# Patient Record
Sex: Female | Born: 1975
Health system: Southern US, Community
[De-identification: ages and names within clinical notes are randomized; demographics above are authoritative.]

## PROBLEM LIST (undated history)

## (undated) DIAGNOSIS — T8859XA Other complications of anesthesia, initial encounter: Secondary | ICD-10-CM

## (undated) DIAGNOSIS — T4145XA Adverse effect of unspecified anesthetic, initial encounter: Secondary | ICD-10-CM

## (undated) DIAGNOSIS — R112 Nausea with vomiting, unspecified: Secondary | ICD-10-CM

## (undated) DIAGNOSIS — J45909 Unspecified asthma, uncomplicated: Secondary | ICD-10-CM

## (undated) DIAGNOSIS — K219 Gastro-esophageal reflux disease without esophagitis: Secondary | ICD-10-CM

## (undated) DIAGNOSIS — R51 Headache: Secondary | ICD-10-CM

## (undated) DIAGNOSIS — Z9889 Other specified postprocedural states: Secondary | ICD-10-CM

## (undated) DIAGNOSIS — D649 Anemia, unspecified: Secondary | ICD-10-CM

## (undated) DIAGNOSIS — N3281 Overactive bladder: Secondary | ICD-10-CM

## (undated) DIAGNOSIS — Z973 Presence of spectacles and contact lenses: Secondary | ICD-10-CM

## (undated) DIAGNOSIS — M797 Fibromyalgia: Secondary | ICD-10-CM

## (undated) DIAGNOSIS — M1990 Unspecified osteoarthritis, unspecified site: Secondary | ICD-10-CM

## (undated) DIAGNOSIS — D259 Leiomyoma of uterus, unspecified: Secondary | ICD-10-CM

## (undated) DIAGNOSIS — R519 Headache, unspecified: Secondary | ICD-10-CM

## (undated) HISTORY — PX: HERNIA REPAIR: SHX51

## (undated) HISTORY — DX: Overactive bladder: N32.81

## (undated) HISTORY — PX: WISDOM TOOTH EXTRACTION: SHX21

## (undated) HISTORY — DX: Leiomyoma of uterus, unspecified: D25.9

## (undated) HISTORY — PX: BREAST EXCISIONAL BIOPSY: SUR124

## (undated) HISTORY — PX: BREAST SURGERY: SHX581

---

## 2005-05-20 ENCOUNTER — Encounter: Admission: RE | Admit: 2005-05-20 | Discharge: 2005-05-20 | Payer: Self-pay | Admitting: Obstetrics and Gynecology

## 2005-05-20 ENCOUNTER — Encounter (INDEPENDENT_AMBULATORY_CARE_PROVIDER_SITE_OTHER): Payer: Self-pay | Admitting: Specialist

## 2005-06-04 ENCOUNTER — Ambulatory Visit (HOSPITAL_COMMUNITY): Admission: RE | Admit: 2005-06-04 | Discharge: 2005-06-04 | Payer: Self-pay | Admitting: General Surgery

## 2005-06-04 ENCOUNTER — Encounter (INDEPENDENT_AMBULATORY_CARE_PROVIDER_SITE_OTHER): Payer: Self-pay | Admitting: Specialist

## 2009-05-24 ENCOUNTER — Other Ambulatory Visit: Admission: RE | Admit: 2009-05-24 | Discharge: 2009-05-24 | Payer: Self-pay | Admitting: Family Medicine

## 2009-12-16 ENCOUNTER — Emergency Department (HOSPITAL_BASED_OUTPATIENT_CLINIC_OR_DEPARTMENT_OTHER): Admission: EM | Admit: 2009-12-16 | Discharge: 2009-12-16 | Payer: Self-pay | Admitting: Emergency Medicine

## 2009-12-16 ENCOUNTER — Ambulatory Visit: Payer: Self-pay | Admitting: Diagnostic Radiology

## 2010-08-19 ENCOUNTER — Other Ambulatory Visit: Payer: Self-pay | Admitting: Family Medicine

## 2010-08-19 ENCOUNTER — Other Ambulatory Visit
Admission: RE | Admit: 2010-08-19 | Discharge: 2010-08-19 | Payer: Self-pay | Source: Home / Self Care | Admitting: Family Medicine

## 2010-10-19 ENCOUNTER — Inpatient Hospital Stay (HOSPITAL_COMMUNITY)
Admission: AD | Admit: 2010-10-19 | Discharge: 2010-10-19 | Disposition: A | Discharge status: Home / Self Care | Payer: 59 | Source: Ambulatory Visit | Attending: Obstetrics | Admitting: Obstetrics

## 2010-10-19 DIAGNOSIS — N6459 Other signs and symptoms in breast: Secondary | ICD-10-CM | POA: Insufficient documentation

## 2010-10-19 LAB — POCT PREGNANCY, URINE
Preg Test, Ur: NEGATIVE
Preg Test, Ur: POSITIVE

## 2010-10-20 LAB — PROLACTIN: Prolactin: 16 ng/mL

## 2010-10-21 ENCOUNTER — Other Ambulatory Visit: Payer: Self-pay | Admitting: Obstetrics

## 2010-10-21 DIAGNOSIS — N6452 Nipple discharge: Secondary | ICD-10-CM

## 2010-10-24 ENCOUNTER — Ambulatory Visit
Admission: RE | Admit: 2010-10-24 | Discharge: 2010-10-24 | Disposition: A | Discharge status: Home / Self Care | Payer: 59 | Source: Ambulatory Visit | Attending: Obstetrics | Admitting: Obstetrics

## 2010-10-24 ENCOUNTER — Other Ambulatory Visit: Payer: Self-pay | Admitting: Obstetrics

## 2010-10-24 DIAGNOSIS — N6452 Nipple discharge: Secondary | ICD-10-CM

## 2010-11-07 ENCOUNTER — Ambulatory Visit
Admission: RE | Admit: 2010-11-07 | Discharge: 2010-11-07 | Disposition: A | Discharge status: Home / Self Care | Payer: 59 | Source: Ambulatory Visit | Attending: Obstetrics | Admitting: Obstetrics

## 2010-11-07 DIAGNOSIS — N6452 Nipple discharge: Secondary | ICD-10-CM

## 2010-11-27 ENCOUNTER — Encounter (HOSPITAL_COMMUNITY): Payer: 59 | Attending: Surgery

## 2010-11-27 ENCOUNTER — Other Ambulatory Visit: Payer: Self-pay | Admitting: Anesthesiology

## 2010-11-27 ENCOUNTER — Other Ambulatory Visit: Payer: Self-pay | Admitting: Surgery

## 2010-11-27 DIAGNOSIS — Z01812 Encounter for preprocedural laboratory examination: Secondary | ICD-10-CM | POA: Insufficient documentation

## 2010-11-27 DIAGNOSIS — N6459 Other signs and symptoms in breast: Secondary | ICD-10-CM | POA: Insufficient documentation

## 2010-11-27 DIAGNOSIS — N6049 Mammary duct ectasia of unspecified breast: Secondary | ICD-10-CM | POA: Insufficient documentation

## 2010-11-27 LAB — SURGICAL PCR SCREEN
MRSA, PCR: NEGATIVE
Staphylococcus aureus: NEGATIVE

## 2010-11-27 LAB — HCG, SERUM, QUALITATIVE: Preg, Serum: NEGATIVE

## 2010-12-05 ENCOUNTER — Ambulatory Visit (HOSPITAL_COMMUNITY)
Admission: RE | Admit: 2010-12-05 | Discharge: 2010-12-05 | Disposition: A | Discharge status: Home / Self Care | Payer: 59 | Source: Ambulatory Visit | Attending: Surgery | Admitting: Surgery

## 2010-12-05 ENCOUNTER — Other Ambulatory Visit: Payer: Self-pay | Admitting: Surgery

## 2010-12-05 DIAGNOSIS — N6459 Other signs and symptoms in breast: Secondary | ICD-10-CM | POA: Insufficient documentation

## 2010-12-05 DIAGNOSIS — D249 Benign neoplasm of unspecified breast: Secondary | ICD-10-CM | POA: Insufficient documentation

## 2010-12-10 NOTE — Op Note (Signed)
NAMELADEJAH, SULA             ACCOUNT NO.:  [id number]  MEDICAL RECORD NO.:  [id number]           PATIENT TYPE:  O  LOCATION:  DAYL                         FACILITY:  [facility]  PHYSICIAN:  Wilmon Arms. Corliss Skains, M.D. DATE OF BIRTH:  1975/09/01  DATE OF PROCEDURE:  12/05/2010 DATE OF DISCHARGE:  12/05/2010                              OPERATIVE REPORT   PREOPERATIVE DIAGNOSIS:  Left bloody nipple discharge.  POSTOPERATIVE DIAGNOSIS:  Left bloody nipple discharge.  PROCEDURE:  Left nipple duct excision.  SURGEON:  Wilmon Arms. Tannis Burstein, M.D.  ANESTHESIA:  General  INDICATIONS:  This is a 35 year old female who presents with a recent onset of bloody nipple discharge from the left breast.  Mammogram was unremarkable.  Ultrasound showed a dilated subareolar duct at 6 o'clock containing some amorphous echogenic material.  A ductogram showed a blind ending subareolar region concerning for papilloma.  She presents now for surgical evaluation.  DESCRIPTION OF PROCEDURE:  The patient was brought to the operating room and placed in supine position on the operating room table.  After an adequate level of general anesthesia was obtained, left breast was prepped with ChloraPrep and draped in sterile fashion.  We cannulated the most prominent opening in the lower nipple.  We were unable to express any bloody discharge but this was the only ductal opening that seems enlarged.  This corresponds with the bloody nipple discharge that I saw in the office few weeks ago.  We inserted a lacrimal duct probe. I then infiltrated the area around the nipple with 0.25% Marcaine.  We made a curvilinear incision around the areola inferiorly.  Dissection was carried superiorly under the nipple until we reached the previously placed lacrimal duct probe.  We identified this duct and grasped this with a hemostat.  We removed the probe.  I then took a cone-shaped cylinder of tissue measuring about 2 cm across for  depth of about 3.5 cm.  The specimen was oriented with the paint kit.  I could palpate some fullness within the specimen.  Specimen was sent for pathologic examination.  We irrigated thoroughly for hemostasis.  I could not palpate any further masses in this area.  We closed the wound with a deep layer of 3-0 Vicryl and a subcuticular 4-0 Monocryl.  Steri-Strips and clean dressings were applied.  The patient was extubated, brought to recovery in stable condition.  All sponge, instrument, needle counts were correct.     Wilmon Arms. Corliss Skains, M.D.    MKT/MEDQ  D:  12/05/2010  T:  12/05/2010  Job:  [id number]  Electronically Signed by Manus Rudd M.D. on 12/10/2010 07:57:40 AM

## 2010-12-12 ENCOUNTER — Ambulatory Visit (HOSPITAL_COMMUNITY)
Admission: EM | Admit: 2010-12-12 | Discharge: 2010-12-12 | Disposition: A | Discharge status: Home / Self Care | Payer: 59 | Source: Ambulatory Visit | Attending: Surgery | Admitting: Surgery

## 2010-12-12 DIAGNOSIS — Y838 Other surgical procedures as the cause of abnormal reaction of the patient, or of later complication, without mention of misadventure at the time of the procedure: Secondary | ICD-10-CM | POA: Insufficient documentation

## 2010-12-12 DIAGNOSIS — IMO0002 Reserved for concepts with insufficient information to code with codable children: Secondary | ICD-10-CM | POA: Insufficient documentation

## 2010-12-12 NOTE — Op Note (Signed)
Sandra [name], [name]             ACCOUNT NO.:  [id number]   MEDICAL RECORD NO.:  [id number]          PATIENT TYPE:  AMB   LOCATION:  DAY                          FACILITY:  [facility]   PHYSICIAN:  Ollen Gross. Vernell Morgans, M.D. DATE OF BIRTH:  September 08, 1975   DATE OF PROCEDURE:  06/04/2005  DATE OF DISCHARGE:                                 OPERATIVE REPORT   PREOPERATIVE DIAGNOSES:  Left breast sclerosing papilloma.   POSTOPERATIVE DIAGNOSES:  Left breast sclerosing papilloma.   PROCEDURE:  Left breast lumpectomy.   SURGEON:  Ollen Gross. Carolynne Edouard, M.D.   ANESTHESIA:  General via LMA.   DESCRIPTION OF PROCEDURE:  After informed consent was obtained, the patient  was brought to the operating room, placed in the supine position on the  operating table. After adequate induction of general anesthesia, the  patient's left breast was prepped with Betadine, draped in the usual sterile  manner. A small transverse incision was made overlying the palpable mass.  This mass was in the upper outer quadrant. This incision was carried down  through the skin and subcutaneous tissue sharply with the electrocautery  into the fatty breast tissue. Once this was accomplished, the mass was  really palpable. The mass was removed from the rest of the fatty breast  tissue by sharp dissection with the electrocautery. Once the mass was  removed, the mass was oriented with the short stitch being superior and long  stitch being lateral and sent to pathology for further evaluation.  Hemostasis was achieved using the Bovie electrocautery. The wound was then  closed with interrupted 4-0 Monocryl subcuticular stitches, Benzoin and  Steri-Strips and sterile dressings were applied. The patient tolerated the  procedure well. At the end of the case, all needle, sponge and instrument  counts were correct. Prior to closing the wound, it was also infiltrated  with 0.25% Marcaine. The patient was then awakened and taken to the recovery  room in stable condition.      Ollen Gross. Vernell Morgans, M.D.  Electronically Signed     PST/MEDQ  D:  06/04/2005  T:  06/04/2005  Job:  [id number]

## 2010-12-15 NOTE — Op Note (Signed)
Sandra [name], Sandra [name]             ACCOUNT NO.:  [id number]  MEDICAL RECORD NO.:  [id number]           PATIENT TYPE:  O  LOCATION:  SDSC                         FACILITY:  [facility]  PHYSICIAN:  Wilmon Arms. Corliss Skains, M.D. DATE OF BIRTH:  September 02, 1975  DATE OF PROCEDURE:  12/12/2010 DATE OF DISCHARGE:  12/12/2010                              OPERATIVE REPORT   PREOPERATIVE DIAGNOSIS:  Left breast postoperative hematoma.  POSTOPERATIVE DIAGNOSIS:  Left breast postoperative hematoma.  PROCEDURE:  Evacuation of left breast hematoma.  SURGEON:  Wilmon Arms. Corliss Skains, MD  ANESTHESIA:  General.  INDICATIONS:  This is a 35 year old female who is 1 week postop from a left nipple duct excision.  Her pathology was benign showing only intraductal papilloma.  However, the patient has developed significant bruising and swelling around her surgical site.  She was seen in the office today and was felt to have a large postoperative hematoma.  She presents now for emergent evacuation.  DESCRIPTION OF PROCEDURE:  The patient was brought to the operating room and placed in supine position on the operating room table.  After an adequate level of general anesthesia was obtained, the patient's left breast was prepped with ChloraPrep and draped in sterile fashion.  Time- out was taken to assure the proper patient and proper procedure.  We opened her previous incision and entered the biopsy cavity.  A large amount of clot was evacuated.  We irrigated thoroughly to remove the remainder of the clot.  We then carefully inspected the entire biopsy cavity in its entirety.  We cauterized anything that look like it might be oozing.  There was a significant area of bleeding deep within the wound laterally.  This was thoroughly cauterized and inspected for hemostasis.  Once we had carefully inspected for any significant bleeding, we took a piece of Surgicel Fibrillar and placed several small pieces around the cavity.  The  wound was then closed with 3-0 Vicryl and a subcuticular 4-0 Monocryl.  Steri-Strips and clean dressing were applied.  The patient was then extubated and brought to the recovery room in stable condition.  All sponge, instrument, and needle counts were correct.     Wilmon Arms. Corliss Skains, M.D.     MKT/MEDQ  D:  12/12/2010  T:  12/13/2010  Job:  [id number]  Electronically Signed by Manus Rudd M.D. on 12/15/2010 10:07:17 AM

## 2011-02-18 ENCOUNTER — Encounter (INDEPENDENT_AMBULATORY_CARE_PROVIDER_SITE_OTHER): Payer: Self-pay | Admitting: Surgery

## 2011-02-19 ENCOUNTER — Encounter (INDEPENDENT_AMBULATORY_CARE_PROVIDER_SITE_OTHER): Payer: Self-pay | Admitting: Surgery

## 2011-10-18 ENCOUNTER — Encounter (HOSPITAL_COMMUNITY): Payer: Self-pay | Admitting: General Practice

## 2011-10-18 ENCOUNTER — Emergency Department (HOSPITAL_COMMUNITY)
Admission: EM | Admit: 2011-10-18 | Discharge: 2011-10-18 | Disposition: A | Discharge status: Home / Self Care | Payer: 59 | Attending: Emergency Medicine | Admitting: Emergency Medicine

## 2011-10-18 DIAGNOSIS — Z880 Allergy status to penicillin: Secondary | ICD-10-CM | POA: Insufficient documentation

## 2011-10-18 DIAGNOSIS — K0889 Other specified disorders of teeth and supporting structures: Secondary | ICD-10-CM

## 2011-10-18 DIAGNOSIS — K089 Disorder of teeth and supporting structures, unspecified: Secondary | ICD-10-CM | POA: Insufficient documentation

## 2011-10-18 MED ORDER — FENTANYL CITRATE 0.05 MG/ML IJ SOLN
50.0000 ug | Freq: Once | INTRAMUSCULAR | Status: AC
  Administered 2011-10-18: 50 ug via INTRAMUSCULAR

## 2011-10-18 MED ORDER — OXYCODONE-ACETAMINOPHEN 5-325 MG PO TABS: 1.0000 | ORAL_TABLET | Freq: Four times a day (QID) | ORAL | Status: AC | PRN

## 2011-10-18 MED ORDER — CLINDAMYCIN HCL 300 MG PO CAPS: 300.0000 mg | ORAL_CAPSULE | Freq: Three times a day (TID) | ORAL | Status: AC

## 2011-10-18 MED ORDER — FENTANYL CITRATE 0.05 MG/ML IJ SOLN
INTRAMUSCULAR | Status: AC
  Administered 2011-10-18: 50 ug via INTRAMUSCULAR
  Filled 2011-10-18: qty 2

## 2011-10-18 NOTE — ED Notes (Signed)
Pt to ED c/o tooth pain to L lower tooth pain in wisdom tooth area.

## 2011-10-18 NOTE — ED Provider Notes (Signed)
History     CSN: [id number]  Arrival date & time 10/18/11  0219   First MD Initiated Contact with Patient 10/18/11 [phone]      Chief Complaint  Patient presents with  . Dental Pain    (Consider location/radiation/quality/duration/timing/severity/associated sxs/prior treatment) Patient is a 36 y.o. female presenting with tooth pain. The history is provided by the patient. No language interpreter was used.  Dental PainPrimary symptoms do not include dental injury, oral bleeding, oral lesions, sore throat or angioedema. The symptoms began 3 to 5 days ago. The symptoms are worsening. The symptoms are new. The symptoms occur constantly.  Additional symptoms include: dental sensitivity to temperature. Additional symptoms do not include: gum swelling, purulent gums, trismus, facial swelling, trouble swallowing, pain with swallowing and dry mouth. Medical issues do not include: smoking.    History reviewed. No pertinent past medical history.  Past Surgical History  Procedure Date  . Cesarean section   . Breast surgery 12/12/2010    lt breast hematoma    No family history on file.  History  Substance Use Topics  . Smoking status: Never Smoker   . Smokeless tobacco: Never Used  . Alcohol Use: 1.2 oz/week    2 Cans of beer per week    OB History    Grav Para Term Preterm Abortions TAB SAB Ect Mult Living                  Review of Systems  Constitutional: Negative.   HENT: Negative for sore throat, facial swelling and trouble swallowing.   Eyes: Negative for visual disturbance.  Respiratory: Negative.   Cardiovascular: Negative.   Gastrointestinal: Negative.   All other systems reviewed and are negative.    Allergies  Penicillins  Home Medications   Current Outpatient Rx  Name Route Sig Dispense Refill  . MOTRIN PM PO Oral Take 4 tablets by mouth every 4 (four) hours as needed. For pain.    Marland Kitchen CLINDAMYCIN HCL 300 MG PO CAPS Oral Take 1 capsule (300 mg total) by mouth  3 (three) times daily. X 7 days 21 capsule 0  . OXYCODONE-ACETAMINOPHEN 5-325 MG PO TABS Oral Take 1 tablet by mouth every 6 (six) hours as needed for pain. 13 tablet 0    BP 118/79  Pulse 79  Temp(Src) 98.3 F (36.8 C) (Oral)  Resp 16  SpO2 100%  Physical Exam  Constitutional: She is oriented to person, place, and time. She appears well-developed and well-nourished. No distress.  HENT:  Head: Normocephalic and atraumatic.  Mouth/Throat: Oropharynx is clear and moist.    Eyes: Conjunctivae are normal. Pupils are equal, round, and reactive to light.  Neck: Normal range of motion. Neck supple.  Cardiovascular: Normal rate and regular rhythm.   Pulmonary/Chest: Effort normal and breath sounds normal.  Abdominal: Soft. Bowel sounds are normal.  Musculoskeletal: Normal range of motion.  Neurological: She is alert and oriented to person, place, and time.  Skin: Skin is warm and dry.  Psychiatric: She has a normal mood and affect.    ED Course  Procedures (including critical care time)  Labs Reviewed - No data to display No results found.   1. Pain, dental       MDM  Take all antibiotics follow up with oral surgery return for facial swelling      Aveyah Greenwood K Kenzleigh Sedam-Rasch, MD 10/18/11 [phone]

## 2011-10-18 NOTE — Discharge Instructions (Signed)
Dental Pain  A tooth ache may be caused by cavities (tooth decay). Cavities expose the nerve of the tooth to air and hot or cold temperatures. It may come from an infection or abscess (also called a boil or furuncle) around your tooth. It is also often caused by dental caries (tooth decay). This causes the pain you are having.  DIAGNOSIS   Your caregiver can diagnose this problem by exam.  TREATMENT   · If caused by an infection, it may be treated with medications which kill germs (antibiotics) and pain medications as prescribed by your caregiver. Take medications as directed.  · Only take over-the-counter or prescription medicines for pain, discomfort, or fever as directed by your caregiver.  · Whether the tooth ache today is caused by infection or dental disease, you should see your dentist as soon as possible for further care.  SEEK MEDICAL CARE IF:  The exam and treatment you received today has been provided on an emergency basis only. This is not a substitute for complete medical or dental care. If your problem worsens or new problems (symptoms) appear, and you are unable to meet with your dentist, call or return to this location.  SEEK IMMEDIATE MEDICAL CARE IF:   · You have a fever.  · You develop redness and swelling of your face, jaw, or neck.  · You are unable to open your mouth.  · You have severe pain uncontrolled by pain medicine.  MAKE SURE YOU:   · Understand these instructions.  · Will watch your condition.  · Will get help right away if you are not doing well or get worse.  Document Released: 07/13/2005 Document Revised: 07/02/2011 Document Reviewed: 02/29/2008  Exit[employer]® Patient Information ©2012 [employer].

## 2011-12-30 ENCOUNTER — Other Ambulatory Visit: Payer: Self-pay | Admitting: Family Medicine

## 2011-12-30 DIAGNOSIS — R928 Other abnormal and inconclusive findings on diagnostic imaging of breast: Secondary | ICD-10-CM

## 2012-01-06 ENCOUNTER — Ambulatory Visit
Admission: RE | Admit: 2012-01-06 | Discharge: 2012-01-06 | Disposition: A | Discharge status: Home / Self Care | Payer: 59 | Source: Ambulatory Visit | Attending: Family Medicine | Admitting: Family Medicine

## 2012-01-06 DIAGNOSIS — R928 Other abnormal and inconclusive findings on diagnostic imaging of breast: Secondary | ICD-10-CM

## 2012-01-08 ENCOUNTER — Ambulatory Visit
Admission: RE | Admit: 2012-01-08 | Discharge: 2012-01-08 | Disposition: A | Discharge status: Home / Self Care | Payer: 59 | Source: Ambulatory Visit | Attending: Family Medicine | Admitting: Family Medicine

## 2012-01-08 ENCOUNTER — Other Ambulatory Visit: Payer: Self-pay | Admitting: Family Medicine

## 2012-01-08 DIAGNOSIS — R928 Other abnormal and inconclusive findings on diagnostic imaging of breast: Secondary | ICD-10-CM

## 2012-03-18 ENCOUNTER — Other Ambulatory Visit: Payer: Self-pay | Admitting: Family Medicine

## 2012-03-18 ENCOUNTER — Ambulatory Visit: Admission: RE | Admit: 2012-03-18 | Payer: 59 | Source: Ambulatory Visit

## 2012-03-18 ENCOUNTER — Ambulatory Visit
Admission: RE | Admit: 2012-03-18 | Discharge: 2012-03-18 | Disposition: A | Discharge status: Home / Self Care | Payer: 59 | Source: Ambulatory Visit

## 2012-03-18 DIAGNOSIS — R928 Other abnormal and inconclusive findings on diagnostic imaging of breast: Secondary | ICD-10-CM

## 2012-10-12 ENCOUNTER — Other Ambulatory Visit: Payer: Self-pay | Admitting: Family Medicine

## 2012-10-12 DIAGNOSIS — R921 Mammographic calcification found on diagnostic imaging of breast: Secondary | ICD-10-CM

## 2012-10-21 ENCOUNTER — Ambulatory Visit
Admission: RE | Admit: 2012-10-21 | Discharge: 2012-10-21 | Disposition: A | Discharge status: Home / Self Care | Payer: 59 | Source: Ambulatory Visit | Attending: Family Medicine | Admitting: Family Medicine

## 2012-10-21 DIAGNOSIS — R921 Mammographic calcification found on diagnostic imaging of breast: Secondary | ICD-10-CM

## 2014-02-12 ENCOUNTER — Other Ambulatory Visit: Payer: Self-pay | Admitting: Family Medicine

## 2014-02-12 DIAGNOSIS — R921 Mammographic calcification found on diagnostic imaging of breast: Secondary | ICD-10-CM

## 2014-02-20 ENCOUNTER — Ambulatory Visit
Admission: RE | Admit: 2014-02-20 | Discharge: 2014-02-20 | Disposition: A | Discharge status: Home / Self Care | Payer: 59 | Source: Ambulatory Visit | Attending: Family Medicine | Admitting: Family Medicine

## 2014-02-20 ENCOUNTER — Encounter (INDEPENDENT_AMBULATORY_CARE_PROVIDER_SITE_OTHER): Payer: Self-pay

## 2014-02-20 DIAGNOSIS — R921 Mammographic calcification found on diagnostic imaging of breast: Secondary | ICD-10-CM

## 2015-01-03 ENCOUNTER — Inpatient Hospital Stay (HOSPITAL_COMMUNITY): Payer: 59

## 2015-01-03 ENCOUNTER — Encounter (HOSPITAL_COMMUNITY): Payer: Self-pay | Admitting: *Deleted

## 2015-01-03 ENCOUNTER — Inpatient Hospital Stay (HOSPITAL_COMMUNITY)
Admission: AD | Admit: 2015-01-03 | Discharge: 2015-01-03 | Disposition: A | Discharge status: Home / Self Care | Payer: 59 | Source: Ambulatory Visit | Attending: Obstetrics & Gynecology | Admitting: Obstetrics & Gynecology

## 2015-01-03 DIAGNOSIS — B373 Candidiasis of vulva and vagina: Secondary | ICD-10-CM | POA: Diagnosis not present

## 2015-01-03 DIAGNOSIS — R102 Pelvic and perineal pain: Secondary | ICD-10-CM | POA: Diagnosis present

## 2015-01-03 DIAGNOSIS — N939 Abnormal uterine and vaginal bleeding, unspecified: Secondary | ICD-10-CM | POA: Insufficient documentation

## 2015-01-03 DIAGNOSIS — Z880 Allergy status to penicillin: Secondary | ICD-10-CM | POA: Insufficient documentation

## 2015-01-03 DIAGNOSIS — N7011 Chronic salpingitis: Secondary | ICD-10-CM

## 2015-01-03 DIAGNOSIS — B3731 Acute candidiasis of vulva and vagina: Secondary | ICD-10-CM

## 2015-01-03 LAB — URINALYSIS, ROUTINE W REFLEX MICROSCOPIC
Bilirubin Urine: NEGATIVE
Glucose, UA: NEGATIVE mg/dL
Ketones, ur: NEGATIVE mg/dL
Leukocytes, UA: NEGATIVE
Nitrite: NEGATIVE
Protein, ur: NEGATIVE mg/dL
Specific Gravity, Urine: 1.015 (ref 1.005–1.030)
Urobilinogen, UA: 0.2 mg/dL (ref 0.0–1.0)
pH: 6 (ref 5.0–8.0)

## 2015-01-03 LAB — CBC WITH DIFFERENTIAL/PLATELET
Basophils Absolute: 0 10*3/uL (ref 0.0–0.1)
Basophils Relative: 0 % (ref 0–1)
Eosinophils Absolute: 0.1 10*3/uL (ref 0.0–0.7)
Eosinophils Relative: 3 % (ref 0–5)
HCT: 31.4 % — ABNORMAL LOW (ref 36.0–46.0)
Hemoglobin: 10.4 g/dL — ABNORMAL LOW (ref 12.0–15.0)
Lymphocytes Relative: 32 % (ref 12–46)
Lymphs Abs: 1.5 10*3/uL (ref 0.7–4.0)
MCH: 28.8 pg (ref 26.0–34.0)
MCHC: 33.1 g/dL (ref 30.0–36.0)
MCV: 87 fL (ref 78.0–100.0)
Monocytes Absolute: 0.4 10*3/uL (ref 0.1–1.0)
Monocytes Relative: 9 % (ref 3–12)
Neutro Abs: 2.5 10*3/uL (ref 1.7–7.7)
Neutrophils Relative %: 56 % (ref 43–77)
Platelets: 222 10*3/uL (ref 150–400)
RBC: 3.61 MIL/uL — ABNORMAL LOW (ref 3.87–5.11)
RDW: 14 % (ref 11.5–15.5)
WBC: 4.6 10*3/uL (ref 4.0–10.5)

## 2015-01-03 LAB — URINE MICROSCOPIC-ADD ON

## 2015-01-03 LAB — POCT PREGNANCY, URINE: Preg Test, Ur: NEGATIVE

## 2015-01-03 LAB — WET PREP, GENITAL: Trich, Wet Prep: NONE SEEN

## 2015-01-03 MED ORDER — ONDANSETRON 8 MG PO TBDP
8.0000 mg | ORAL_TABLET | Freq: Once | ORAL | Status: AC
  Administered 2015-01-03: 8 mg via ORAL
  Filled 2015-01-03: qty 1

## 2015-01-03 MED ORDER — HYDROMORPHONE HCL 1 MG/ML IJ SOLN
1.0000 mg | Freq: Once | INTRAMUSCULAR | Status: AC
  Administered 2015-01-03: 1 mg via INTRAMUSCULAR
  Filled 2015-01-03: qty 1

## 2015-01-03 MED ORDER — FLUCONAZOLE 150 MG PO TABS
150.0000 mg | ORAL_TABLET | Freq: Once | ORAL | Status: AC
  Administered 2015-01-03: 150 mg via ORAL
  Filled 2015-01-03: qty 1

## 2015-01-03 MED ORDER — NAPROXEN 250 MG PO TABS: 250.0000 mg | ORAL_TABLET | Freq: Two times a day (BID) | ORAL | Status: DC

## 2015-01-03 MED ORDER — TRAMADOL HCL 50 MG PO TABS: 50.0000 mg | ORAL_TABLET | Freq: Four times a day (QID) | ORAL | Status: DC | PRN

## 2015-01-03 MED ORDER — NORGESTIMATE-ETH ESTRADIOL 0.25-35 MG-MCG PO TABS: 1.0000 | ORAL_TABLET | Freq: Every day | ORAL | Status: DC

## 2015-01-03 NOTE — Discharge Instructions (Signed)
Candidal Vulvovaginitis Candidal vulvovaginitis is an infection of the vagina and vulva. The vulva is the skin around the opening of the vagina. This may cause itching and discomfort in and around the vagina.  HOME CARE  Only take medicine as told by your doctor.  Do not have sex (intercourse) until the infection is healed or as told by your doctor.  Practice safe sex.  Tell your sex partner about your infection.  Do not douche or use tampons.  Wear cotton underwear. Do not wear tight pants or panty hose.  Eat yogurt. This may help treat and prevent yeast infections. GET HELP RIGHT AWAY IF:   You have a fever.  Your problems get worse during treatment or do not get better in 3 days.  You have discomfort, irritation, or itching in your vagina or vulva area.  You have pain after sex.  You start to get belly (abdominal) pain. MAKE SURE YOU:  Understand these instructions.  Will watch your condition.  Will get help right away if you are not doing well or get worse. Document Released: 10/09/2008 Document Revised: 07/18/2013 Document Reviewed: 10/09/2008 [facility] Patient Information 2015 [place], [place]. This information is not intended to replace advice given to you by your health care provider. Make sure you discuss any questions you have with your health care provider. Pelvic Pain Pelvic pain is pain felt below the belly button and between your hips. It can be caused by many different things. It is important to get help right away. This is especially true for severe, sharp, or unusual pain that comes on suddenly.  HOME CARE  Only take medicine as told by your doctor.  Rest as told by your doctor.  Eat a healthy diet, such as fruits, vegetables, and lean meats.  Drink enough fluids to keep your pee (urine) clear or pale yellow, or as told.  Avoid sex (intercourse) if it causes pain.  Apply warm or cold packs to your lower belly (abdomen). Use the type of pack that helps  the pain.  Avoid situations that cause you stress.  Keep a journal to track your pain. Write down:  When the pain started.  Where it is located.  If there are things that seem to be related to the pain, such as food or your period.  Follow up with your doctor as told. GET HELP RIGHT AWAY IF:   You have heavy bleeding from the vagina.  You have more pelvic pain.  You feel lightheaded or pass out (faint).  You have chills.  You have pain when you pee or have blood in your pee.  You cannot stop having watery poop (diarrhea).  You cannot stop throwing up (vomiting).  You have a fever or lasting symptoms for more than 3 days.  You have a fever and your symptoms suddenly get worse.  You are being physically or sexually abused.  Your medicine does not help your pain.  You have fluid (discharge) coming from your vagina that is not normal. MAKE SURE YOU:  Understand these instructions.  Will watch your condition.  Will get help if you are not doing well or get worse. Document Released: 12/30/2007 Document Revised: 01/12/2012 Document Reviewed: 11/02/2011 [facility] Patient Information 2015 [place], [place]. This information is not intended to replace advice given to you by your health care provider. Make sure you discuss any questions you have with your health care provider.

## 2015-01-03 NOTE — MAU Provider Note (Signed)
History     CSN: [id number]  Arrival date and time: 01/03/15 1145   First Provider Initiated Contact with Patient 01/03/15 1318      Chief Complaint  Patient presents with  . Pelvic Pain   HPI  [name] is a 39 y.o. G2P2000 who presents to MAU today with complaint of pelvic pain and vaginal bleeding. The patient states that she had bleeding most of the month of May. Bleeding stopped for a few days and then she resumed bleeding on 12/31/14. She states that bleeding is heavy. She states that she is bleeding through a super plus tampon q 1 hour. She denies history of irregular periods. She states periods are usually 7 days. She had BTL for birth control and denies other recent birth control use. She is sexually active. She states bilateral pelvic pain x 2 days since onset of bleeding. She states pain sometimes radiates into the LUQ. She denies back or flank pain. She states pain is 10/10 now. She has been taking ibuprofen for pain up to 800 mg at a time x 2 days with minimal relief. She denies fever or UTI symptoms. She states mild nausea without vomiting, diarrhea or constipation, but does admit to one loose stool earlier today.   OB History    Gravida Para Term Preterm AB TAB SAB Ectopic Multiple Living   2 2 2              History reviewed. No pertinent past medical history.  Past Surgical History  Procedure Laterality Date  . Cesarean section    . Breast surgery  12/12/2010    lt breast hematoma    History reviewed. No pertinent family history.  History  Substance Use Topics  . Smoking status: Never Smoker   . Smokeless tobacco: Never Used  . Alcohol Use: 1.2 oz/week    2 Cans of beer per week    Allergies:  Allergies  Allergen Reactions  . Penicillins Rash    No prescriptions prior to admission    Review of Systems  Constitutional: Negative for fever and malaise/fatigue.  Gastrointestinal: Positive for nausea and abdominal pain. Negative for vomiting,  diarrhea and constipation.  Genitourinary: Negative for dysuria, urgency, frequency and flank pain.       + vaginal bleeding   Physical Exam   Blood pressure 105/68, pulse 73, temperature 97.4 F (36.3 C), temperature source Oral, resp. rate 18, weight 177 lb 7 oz (80.485 kg), last menstrual period 12/31/2014, SpO2 100 %.  Physical Exam  Nursing note and vitals reviewed. Constitutional: She is oriented to person, place, and time. She appears well-developed and well-nourished. No distress.  HENT:  Head: Normocephalic and atraumatic.  Cardiovascular: Normal rate.   Respiratory: Effort normal.  GI: Soft. She exhibits no distension and no mass. There is tenderness (mild tenderness to palpation of the suprapubic region bilaterally). There is no rebound and no guarding.  Genitourinary: Uterus is not enlarged and not tender. Cervix exhibits no motion tenderness, no discharge and no friability. Right adnexum displays no mass and no tenderness. Left adnexum displays no mass and no tenderness. No bleeding in the vagina. No vaginal discharge found.  Neurological: She is alert and oriented to person, place, and time.  Skin: Skin is warm and dry. No erythema.  Psychiatric: She has a normal mood and affect.   Results for orders placed or performed during the hospital encounter of 01/03/15 (from the past 24 hour(s))  Urinalysis, Routine w reflex microscopic (not  at [facility])     Status: Abnormal   Collection Time: 01/03/15 12:03 PM  Result Value Ref Range   Color, Urine YELLOW YELLOW   APPearance CLEAR CLEAR   Specific Gravity, Urine 1.015 1.005 - 1.030   pH 6.0 5.0 - 8.0   Glucose, UA NEGATIVE NEGATIVE mg/dL   Hgb urine dipstick TRACE (A) NEGATIVE   Bilirubin Urine NEGATIVE NEGATIVE   Ketones, ur NEGATIVE NEGATIVE mg/dL   Protein, ur NEGATIVE NEGATIVE mg/dL   Urobilinogen, UA 0.2 0.0 - 1.0 mg/dL   Nitrite NEGATIVE NEGATIVE   Leukocytes, UA NEGATIVE NEGATIVE  Urine microscopic-add on     Status:  None   Collection Time: 01/03/15 12:03 PM  Result Value Ref Range   Squamous Epithelial / LPF RARE RARE   RBC / HPF 0-2 <3 RBC/hpf  Pregnancy, urine POC     Status: None   Collection Time: 01/03/15 12:23 PM  Result Value Ref Range   Preg Test, Ur NEGATIVE NEGATIVE  CBC with Differential/Platelet     Status: Abnormal   Collection Time: 01/03/15  1:38 PM  Result Value Ref Range   WBC 4.6 4.0 - 10.5 K/uL   RBC 3.61 (L) 3.87 - 5.11 MIL/uL   Hemoglobin 10.4 (L) 12.0 - 15.0 g/dL   HCT 31.4 (L) 36.0 - 46.0 %   MCV 87.0 78.0 - 100.0 fL   MCH 28.8 26.0 - 34.0 pg   MCHC 33.1 30.0 - 36.0 g/dL   RDW 14.0 11.5 - 15.5 %   Platelets 222 150 - 400 K/uL   Neutrophils Relative % 56 43 - 77 %   Neutro Abs 2.5 1.7 - 7.7 K/uL   Lymphocytes Relative 32 12 - 46 %   Lymphs Abs 1.5 0.7 - 4.0 K/uL   Monocytes Relative 9 3 - 12 %   Monocytes Absolute 0.4 0.1 - 1.0 K/uL   Eosinophils Relative 3 0 - 5 %   Eosinophils Absolute 0.1 0.0 - 0.7 K/uL   Basophils Relative 0 0 - 1 %   Basophils Absolute 0.0 0.0 - 0.1 K/uL  Wet prep, genital     Status: Abnormal   Collection Time: 01/03/15  4:05 PM  Result Value Ref Range   Yeast Wet Prep HPF POC MODERATE (A) NONE SEEN   Trich, Wet Prep NONE SEEN NONE SEEN   Clue Cells Wet Prep HPF POC FEW (A) NONE SEEN   WBC, Wet Prep HPF POC FEW (A) NONE SEEN    MAU Course  Procedures None  MDM UPT - negative UA, wet prep, GC/Chlamydia, CBC, HIV, RPR and [place] today 1 mg Dilaudid given in MAU Patient reports resolution of pain, but is nauseous and vomiting from medication 8 mg ODT Zofran given Patient reports improvement in all symptoms 150 mg Diflucan given in MAU Discussed patient with Dr. Ihor Dow. She recommends Rx for Naproxen and Ultram and OCPs and follow-up with [facility] in 2-3 weeks.   Assessment and Plan  A: Hydrosalpinx Yeast vulvovaginitis Abnormal uterine bleeding  P: Discharge home Rx for Naproxen, Ultram and Sprintec given/sent to patient's  pharmacy Warning signs for worsening condition discussed Referred to [facility] for further evaluation of AUB and follow-up for hydrosalpinx. They will call patient with an appointment.  Patient may return to MAU as needed or if her condition were to change or worsen   Luvenia Redden, PA-C  01/03/2015, 5:43 PM

## 2015-01-03 NOTE — MAU Note (Addendum)
Pt c/o pain in her ovaries since last night.  Pt c/o vag bleeding (had her period)x 2 months. Stated the pain she is having today is different from cramps. Pt reported she has taken 6 ibuprofen this morning without releif

## 2015-01-04 LAB — RPR: RPR Ser Ql: NONREACTIVE

## 2015-01-04 LAB — GC/CHLAMYDIA PROBE AMP (~~LOC~~) NOT AT ARMC
Chlamydia: NEGATIVE
Neisseria Gonorrhea: NEGATIVE

## 2015-01-04 LAB — HIV ANTIBODY (ROUTINE TESTING W REFLEX): HIV Screen 4th Generation wRfx: NONREACTIVE

## 2015-01-31 ENCOUNTER — Encounter: Payer: Self-pay | Admitting: Family Medicine

## 2015-01-31 ENCOUNTER — Ambulatory Visit (INDEPENDENT_AMBULATORY_CARE_PROVIDER_SITE_OTHER): Payer: 59 | Admitting: Family Medicine

## 2015-01-31 VITALS — BP 106/63 | HR 82 | Temp 98.3°F | Ht 62.0 in | Wt 181.4 lb

## 2015-01-31 DIAGNOSIS — Z3202 Encounter for pregnancy test, result negative: Secondary | ICD-10-CM

## 2015-01-31 DIAGNOSIS — N8000 Endometriosis of the uterus, unspecified: Secondary | ICD-10-CM

## 2015-01-31 DIAGNOSIS — N946 Dysmenorrhea, unspecified: Secondary | ICD-10-CM | POA: Diagnosis not present

## 2015-01-31 DIAGNOSIS — Z30433 Encounter for removal and reinsertion of intrauterine contraceptive device: Secondary | ICD-10-CM | POA: Diagnosis not present

## 2015-01-31 DIAGNOSIS — N8003 Adenomyosis of the uterus: Secondary | ICD-10-CM | POA: Insufficient documentation

## 2015-01-31 LAB — POCT PREGNANCY, URINE: Preg Test, Ur: NEGATIVE

## 2015-01-31 MED ORDER — IBUPROFEN 200 MG PO TABS
800.0000 mg | ORAL_TABLET | Freq: Once | ORAL | Status: AC
  Administered 2015-01-31: 800 mg via ORAL

## 2015-01-31 MED ORDER — LEVONORGESTREL 20 MCG/24HR IU IUD
INTRAUTERINE_SYSTEM | Freq: Once | INTRAUTERINE | Status: AC
  Administered 2015-01-31: 1 via INTRAUTERINE

## 2015-01-31 NOTE — Progress Notes (Signed)
Subjective:    Patient ID: Sandra [name], female    DOB: 06/07/1976, 39 y.o.   MRN: [id number]  HPI 38yo [id number], referred to abnormal bleeding that started two months ago.  Bleeding for 2 weeks, then stops for two weeks.  Was seen in [facility]U 1 month ago, was prescribed sprintec, which has helped a little bit.  Still having cramping pain, mostly left sided. Was prescribed naproxen, which is mildly helpful.  The patient had an ultrasound in the MAU which showed a cervical thickness of 4 mm, with heterogeneous echotexture compatible with adenomyosis. There was no fibroids or other masses visualized.   Review of Systems  Constitutional: Negative for fever, chills and fatigue.  Cardiovascular: Negative for chest pain and palpitations.  Gastrointestinal: Negative for nausea, vomiting, abdominal pain, diarrhea, constipation and abdominal distention.  Genitourinary: Positive for pelvic pain. Negative for dysuria, decreased urine volume, vaginal discharge and vaginal pain.  All other systems reviewed and are negative.      Objective:   Physical Exam  Constitutional: She is oriented to person, place, and time. She appears well-developed and well-nourished.  HENT:  Head: Normocephalic and atraumatic.  Cardiovascular: Normal rate, regular rhythm and normal heart sounds.  Exam reveals no gallop and no friction rub.   No murmur heard. Pulmonary/Chest: Effort normal and breath sounds normal. No respiratory distress. She has no wheezes. She has no rales. She exhibits no tenderness.  Abdominal: Soft. Bowel sounds are normal. She exhibits no distension and no mass. There is no tenderness. There is no rebound and no guarding.  Genitourinary: There is no rash, tenderness or lesion on the right labia. There is no rash, tenderness or lesion on the left labia. Cervix exhibits no motion tenderness and no discharge. No erythema, tenderness or bleeding in the vagina. No foreign body around the vagina. No signs of  injury around the vagina. No vaginal discharge found.  Neurological: She is alert and oriented to person, place, and time.  Skin: Skin is warm and dry.  Psychiatric: She has a normal mood and affect. Her behavior is normal. Judgment and thought content normal.      Assessment & Plan:  1. Uterus, adenomyosis 2. Dysmenorrhea Discussed with the patient the [place] results and that adenomyosis likely causing dysfunction.  Endometrial biopsy would like be inconclusive and no helpful.  Discussed using Mirena for control of bleeding and dysmenorrhea.  Risks discussed - pt consented.   - levonorgestrel (MIRENA) 20 MCG/24HR IUD; by Intrauterine route once. - ibuprofen (ADVIL,MOTRIN) tablet 800 mg; Take 4 tablets (800 mg total) by mouth once.   IUD Procedure Note Patient identified, informed consent performed, signed copy in chart, time out was performed.  Urine pregnancy test negative.  Speculum placed in the vagina.  Cervix visualized.  Cleaned with Betadine x 2.  Grasped anteriorly with a single tooth tenaculum.  Internal os mildly stenotic, which was dilated with plastic dilators easily.  Uterus sounded to 8 cm.  Mirena IUD placed per manufacturer's recommendations with mild resistance.  Strings trimmed to 3 cm. Tenaculum was removed, good hemostasis noted.  Patient tolerated procedure well.   Patient given post procedure instructions and Mirena care card with expiration date.  Patient is asked to check IUD strings periodically and follow up in 4-6 weeks for IUD check.

## 2015-01-31 NOTE — Patient Instructions (Signed)

## 2015-02-28 ENCOUNTER — Ambulatory Visit: Payer: 59 | Admitting: Family Medicine

## 2017-03-25 DIAGNOSIS — K5904 Chronic idiopathic constipation: Secondary | ICD-10-CM | POA: Diagnosis not present

## 2017-03-25 DIAGNOSIS — R1013 Epigastric pain: Secondary | ICD-10-CM | POA: Diagnosis not present

## 2017-03-25 DIAGNOSIS — K219 Gastro-esophageal reflux disease without esophagitis: Secondary | ICD-10-CM | POA: Diagnosis not present

## 2017-03-28 ENCOUNTER — Other Ambulatory Visit: Payer: Self-pay | Admitting: Gastroenterology

## 2017-03-28 DIAGNOSIS — R1011 Right upper quadrant pain: Secondary | ICD-10-CM

## 2017-03-28 NOTE — Progress Notes (Signed)
Joseff Luckman MD 

## 2017-04-09 ENCOUNTER — Ambulatory Visit (HOSPITAL_COMMUNITY)
Admission: RE | Admit: 2017-04-09 | Discharge: 2017-04-09 | Disposition: A | Discharge status: Home / Self Care | Payer: 59 | Source: Ambulatory Visit | Attending: Gastroenterology | Admitting: Gastroenterology

## 2017-04-09 ENCOUNTER — Encounter (HOSPITAL_COMMUNITY)
Admission: RE | Admit: 2017-04-09 | Discharge: 2017-04-09 | Disposition: A | Discharge status: Home / Self Care | Payer: 59 | Source: Ambulatory Visit | Attending: Gastroenterology | Admitting: Gastroenterology

## 2017-04-09 ENCOUNTER — Encounter (HOSPITAL_COMMUNITY): Payer: Self-pay | Admitting: Radiology

## 2017-04-09 DIAGNOSIS — R1011 Right upper quadrant pain: Secondary | ICD-10-CM | POA: Insufficient documentation

## 2017-04-09 DIAGNOSIS — D1803 Hemangioma of intra-abdominal structures: Secondary | ICD-10-CM | POA: Diagnosis not present

## 2017-04-09 DIAGNOSIS — D1809 Hemangioma of other sites: Secondary | ICD-10-CM | POA: Diagnosis not present

## 2017-04-09 MED ORDER — TECHNETIUM TC 99M MEBROFENIN IV KIT
5.4300 | PACK | Freq: Once | INTRAVENOUS | Status: AC | PRN
  Administered 2017-04-09: 5.43 via INTRAVENOUS

## 2017-05-14 DIAGNOSIS — K219 Gastro-esophageal reflux disease without esophagitis: Secondary | ICD-10-CM | POA: Diagnosis not present

## 2017-05-14 DIAGNOSIS — K259 Gastric ulcer, unspecified as acute or chronic, without hemorrhage or perforation: Secondary | ICD-10-CM | POA: Diagnosis not present

## 2017-05-14 DIAGNOSIS — K319 Disease of stomach and duodenum, unspecified: Secondary | ICD-10-CM | POA: Diagnosis not present

## 2017-05-14 DIAGNOSIS — R1013 Epigastric pain: Secondary | ICD-10-CM | POA: Diagnosis not present

## 2017-05-26 DIAGNOSIS — M50322 Other cervical disc degeneration at C5-C6 level: Secondary | ICD-10-CM | POA: Diagnosis not present

## 2017-05-26 DIAGNOSIS — M47812 Spondylosis without myelopathy or radiculopathy, cervical region: Secondary | ICD-10-CM | POA: Diagnosis not present

## 2017-05-27 HISTORY — PX: ESOPHAGOGASTRODUODENOSCOPY: SHX1529

## 2017-06-07 ENCOUNTER — Other Ambulatory Visit: Payer: Self-pay | Admitting: Surgery

## 2017-06-07 ENCOUNTER — Other Ambulatory Visit: Payer: Self-pay | Admitting: General Surgery

## 2017-06-07 DIAGNOSIS — Z1231 Encounter for screening mammogram for malignant neoplasm of breast: Secondary | ICD-10-CM

## 2017-06-07 DIAGNOSIS — K828 Other specified diseases of gallbladder: Secondary | ICD-10-CM | POA: Diagnosis not present

## 2017-06-07 DIAGNOSIS — N6320 Unspecified lump in the left breast, unspecified quadrant: Secondary | ICD-10-CM | POA: Diagnosis not present

## 2017-06-08 ENCOUNTER — Other Ambulatory Visit: Payer: Self-pay | Admitting: Surgery

## 2017-06-08 DIAGNOSIS — IMO0002 Reserved for concepts with insufficient information to code with codable children: Secondary | ICD-10-CM

## 2017-06-08 DIAGNOSIS — R229 Localized swelling, mass and lump, unspecified: Principal | ICD-10-CM

## 2017-06-08 DIAGNOSIS — N6320 Unspecified lump in the left breast, unspecified quadrant: Secondary | ICD-10-CM

## 2017-06-10 ENCOUNTER — Ambulatory Visit
Admission: RE | Admit: 2017-06-10 | Discharge: 2017-06-10 | Disposition: A | Discharge status: Home / Self Care | Payer: 59 | Source: Ambulatory Visit | Attending: Surgery | Admitting: Surgery

## 2017-06-10 DIAGNOSIS — N6320 Unspecified lump in the left breast, unspecified quadrant: Secondary | ICD-10-CM

## 2017-06-10 DIAGNOSIS — N644 Mastodynia: Secondary | ICD-10-CM | POA: Diagnosis not present

## 2017-06-10 DIAGNOSIS — R229 Localized swelling, mass and lump, unspecified: Principal | ICD-10-CM

## 2017-06-10 DIAGNOSIS — IMO0002 Reserved for concepts with insufficient information to code with codable children: Secondary | ICD-10-CM

## 2017-06-10 DIAGNOSIS — R922 Inconclusive mammogram: Secondary | ICD-10-CM | POA: Diagnosis not present

## 2017-06-11 ENCOUNTER — Other Ambulatory Visit: Payer: Self-pay | Admitting: Surgery

## 2017-06-25 ENCOUNTER — Encounter (HOSPITAL_COMMUNITY): Payer: Self-pay | Admitting: *Deleted

## 2017-06-25 ENCOUNTER — Other Ambulatory Visit: Payer: Self-pay

## 2017-06-27 NOTE — H&P (Signed)
Sandra [name] 06/07/2017 11:48 AM Location: [facility] Patient #: [id number] DOB: 12/06/1975 Single / Language: [place] / Race: Black or African American Female   History of Present Illness (Kian Gamarra A. Magnus Ivan MD; 06/07/2017 12:01 PM) The patient is a 41 year old female who presents with abdominal pain. This patient is referred to me by Dr. Charna Elizabeth for evaluation of possible biliary dyskinesia. The patient had been having pain in her epigastrium for about 3 months. It is worse with fatty food and she has had nausea. She had a negative ultrasound. Her HIDA scan showed a 39% gallbladder ejection fraction. Upper endoscopy did show small ulcers. She also has recently developed left breast pain as palpated a mass in her left breast. She has a previous history of bilateral breast papillomas status post surgery on her breast. She describes a sharp and burning pain in the epigastrium which is moderate in intensity and does not refer anywhere else. Again, it is worse with fatty meals.   Diagnostic Studies History Doristine Devoid, CMA; 06/07/2017 11:48 AM) Mammogram  1-3 years ago Pap Smear  >5 years ago  Allergies Doristine Devoid, CMA; 06/07/2017 11:49 AM) Penicillins   Medication History Doristine Devoid, CMA; 06/07/2017 11:49 AM) Pantoprazole Sodium (40MG  Tablet DR, Oral) Active. Medications Reconciled  Social History Doristine Devoid, CMA; 06/07/2017 11:48 AM) Alcohol use  Occasional alcohol use. Caffeine use  Carbonated beverages, Tea. No drug use  Tobacco use  Never smoker.  Family History Doristine Devoid, [place]; 06/07/2017 11:48 AM) First Degree Relatives  No pertinent family history   Pregnancy / Birth History Doristine Devoid, CMA; 06/07/2017 11:48 AM) Contraceptive History  Intrauterine device. Gravida  2 Irregular periods  Maternal age  47-20 Para  2    Review of Systems Doristine Devoid CMA; 06/07/2017 11:48 AM) General Not Present- Appetite  Loss, Chills, Fatigue, Fever, Night Sweats, Weight Gain and Weight Loss. Skin Not Present- Change in Wart/Mole, Dryness, Hives, Jaundice, New Lesions, Non-Healing Wounds, Rash and Ulcer. HEENT Not Present- Earache, Hearing Loss, Hoarseness, Nose Bleed, Oral Ulcers, Ringing in the Ears, Seasonal Allergies, Sinus Pain, Sore Throat, Visual Disturbances, Wears glasses/contact lenses and Yellow Eyes. Respiratory Not Present- Bloody sputum, Chronic Cough, Difficulty Breathing, Snoring and Wheezing. Gastrointestinal Present- Abdominal Pain. Not Present- Bloating, Bloody Stool, Change in Bowel Habits, Chronic diarrhea, Constipation, Difficulty Swallowing, Excessive gas, Gets full quickly at meals, Hemorrhoids, Indigestion, Nausea, Rectal Pain and Vomiting. Neurological Not Present- Decreased Memory, Fainting, Headaches, Numbness, Seizures, Tingling, Tremor, Trouble walking and Weakness. Psychiatric Not Present- Anxiety, Bipolar, Change in Sleep Pattern, Depression, Fearful and Frequent crying. Endocrine Not Present- Cold Intolerance, Excessive Hunger, Hair Changes, Heat Intolerance, Hot flashes and New Diabetes. Hematology Not Present- Blood Thinners, Easy Bruising, Excessive bleeding, Gland problems, HIV and Persistent Infections.  Vitals (Chemira Jones CMA; 06/07/2017 11:49 AM) 06/07/2017 11:48 AM Weight: 203.2 lb Height: 62in Body Surface Area: 1.92 m Body Mass Index: 37.17 kg/m  Temp.: 97F(Oral)  Pulse: 91 (Regular)  BP: 122/78 (Sitting, Left Arm, Standard)       Physical Exam (Brydan Downard A. Magnus Ivan MD; 06/07/2017 12:02 PM) General Mental Status-Alert. General Appearance-Consistent with stated age. Hydration-Well hydrated. Voice-Normal.  Head and Neck Head-normocephalic, atraumatic with no lesions or palpable masses.  Eye Eyeball - Bilateral-Extraocular movements intact. Sclera/Conjunctiva - Bilateral-No scleral icterus.  Chest and Lung Exam Chest and lung  exam reveals -quiet, even and easy respiratory effort with no use of accessory muscles and on auscultation, normal breath sounds, no adventitious sounds and normal vocal resonance.  Inspection Chest Wall - Normal. Back - normal.  Breast Note: Her left breast does show some tenderness at the 6 o'clock position with a proximal 1 cm mass. There are no skin changes. The mass is soft   Cardiovascular Cardiovascular examination reveals -on palpation PMI is normal in location and amplitude, no palpable S3 or S4. Normal cardiac borders., normal heart sounds, regular rate and rhythm with no murmurs, carotid auscultation reveals no bruits and normal pedal pulses bilaterally.  Abdomen Inspection Inspection of the abdomen reveals - No Hernias. Skin - Scar - no surgical scars. Palpation/Percussion Palpation and Percussion of the abdomen reveal - Soft, Non Tender, No Rebound tenderness, No Rigidity (guarding) and No hepatosplenomegaly. Auscultation Auscultation of the abdomen reveals - Bowel sounds normal.  Neurologic - Did not examine.  Musculoskeletal - Did not examine.    Assessment & Plan (Deztiny Sarra A. Magnus Ivan MD; 06/07/2017 12:03 PM) BILIARY DYSKINESIA (K82.8) Impression: I believe she does have biliary dyskinesia. She had symptoms with Ensure ingestion which were quite severe during the HIDA scan. Her symptoms seem consistent as well. I recommend laparoscopic cholecystectomy. We discussed this in detail including the risks of surgery.  These risks include but are not limited to bleeding, infection, injury to surrounding structures, the need to convert to an open procedure, the chance this may not resolve her symptoms, etc.  She agrees to proceed.

## 2017-06-27 NOTE — Anesthesia Preprocedure Evaluation (Signed)
Anesthesia Evaluation  Patient identified by MRN, date of birth, ID band Patient awake    Reviewed: Allergy & Precautions, NPO status , Patient's Chart, lab work & pertinent test results  History of Anesthesia Complications (+) PONV and history of anesthetic complications  Airway Mallampati: II  TM Distance: >3 FB Neck ROM: Full    Dental no notable dental hx.    Pulmonary neg pulmonary ROS, Current Smoker,    Pulmonary exam normal breath sounds clear to auscultation       Cardiovascular negative cardio ROS Normal cardiovascular exam Rhythm:Regular Rate:Normal     Neuro/Psych negative neurological ROS  negative psych ROS   GI/Hepatic negative GI ROS, Neg liver ROS, GERD  Medicated,  Endo/Other  negative endocrine ROS  Renal/GU negative Renal ROS  negative genitourinary   Musculoskeletal negative musculoskeletal ROS (+)   Abdominal   Peds negative pediatric ROS (+)  Hematology negative hematology ROS (+)   Anesthesia Other Findings   Reproductive/Obstetrics negative OB ROS                             Anesthesia Physical Anesthesia Plan  ASA: II  Anesthesia Plan: General   Post-op Pain Management:    Induction: Intravenous  PONV Risk Score and Plan: 3 and Ondansetron, Dexamethasone, Scopolamine patch - Pre-op and Treatment may vary due to age or medical condition  Airway Management Planned: Oral ETT  Additional Equipment:   Intra-op Plan:   Post-operative Plan: Extubation in OR  Informed Consent: I have reviewed the patients History and Physical, chart, labs and discussed the procedure including the risks, benefits and alternatives for the proposed anesthesia with the patient or authorized representative who has indicated his/her understanding and acceptance.     Plan Discussed with: CRNA and Anesthesiologist  Anesthesia Plan Comments: (  )        Anesthesia  Quick Evaluation

## 2017-06-28 ENCOUNTER — Ambulatory Visit (HOSPITAL_COMMUNITY): Payer: 59 | Admitting: Certified Registered Nurse Anesthetist

## 2017-06-28 ENCOUNTER — Encounter (HOSPITAL_COMMUNITY)
Admission: RE | Disposition: A | Discharge status: Home / Self Care | Payer: Self-pay | Source: Ambulatory Visit | Attending: Surgery

## 2017-06-28 ENCOUNTER — Encounter (HOSPITAL_COMMUNITY): Payer: Self-pay | Admitting: Certified Registered Nurse Anesthetist

## 2017-06-28 ENCOUNTER — Ambulatory Visit (HOSPITAL_COMMUNITY)
Admission: RE | Admit: 2017-06-28 | Discharge: 2017-06-28 | Disposition: A | Discharge status: Home / Self Care | Payer: 59 | Source: Ambulatory Visit | Attending: Surgery | Admitting: Surgery

## 2017-06-28 DIAGNOSIS — K811 Chronic cholecystitis: Secondary | ICD-10-CM | POA: Diagnosis not present

## 2017-06-28 DIAGNOSIS — Z880 Allergy status to penicillin: Secondary | ICD-10-CM | POA: Insufficient documentation

## 2017-06-28 DIAGNOSIS — F17200 Nicotine dependence, unspecified, uncomplicated: Secondary | ICD-10-CM | POA: Insufficient documentation

## 2017-06-28 DIAGNOSIS — K219 Gastro-esophageal reflux disease without esophagitis: Secondary | ICD-10-CM | POA: Diagnosis not present

## 2017-06-28 DIAGNOSIS — K828 Other specified diseases of gallbladder: Secondary | ICD-10-CM | POA: Diagnosis not present

## 2017-06-28 DIAGNOSIS — N946 Dysmenorrhea, unspecified: Secondary | ICD-10-CM | POA: Diagnosis not present

## 2017-06-28 HISTORY — DX: Adverse effect of unspecified anesthetic, initial encounter: T41.45XA

## 2017-06-28 HISTORY — DX: Nausea with vomiting, unspecified: Z98.890

## 2017-06-28 HISTORY — DX: Other specified postprocedural states: R11.2

## 2017-06-28 HISTORY — DX: Other complications of anesthesia, initial encounter: T88.59XA

## 2017-06-28 HISTORY — PX: CHOLECYSTECTOMY: SHX55

## 2017-06-28 HISTORY — DX: Gastro-esophageal reflux disease without esophagitis: K21.9

## 2017-06-28 LAB — CBC
HCT: 36.9 % (ref 36.0–46.0)
Hemoglobin: 11.9 g/dL — ABNORMAL LOW (ref 12.0–15.0)
MCH: 28.5 pg (ref 26.0–34.0)
MCHC: 32.2 g/dL (ref 30.0–36.0)
MCV: 88.5 fL (ref 78.0–100.0)
Platelets: 227 10*3/uL (ref 150–400)
RBC: 4.17 MIL/uL (ref 3.87–5.11)
RDW: 13.4 % (ref 11.5–15.5)
WBC: 5 10*3/uL (ref 4.0–10.5)

## 2017-06-28 LAB — HCG, SERUM, QUALITATIVE: Preg, Serum: NEGATIVE

## 2017-06-28 SURGERY — LAPAROSCOPIC CHOLECYSTECTOMY
Anesthesia: General | Site: Abdomen

## 2017-06-28 MED ORDER — CHLORHEXIDINE GLUCONATE CLOTH 2 % EX PADS: 6.0000 | MEDICATED_PAD | Freq: Once | CUTANEOUS | Status: DC

## 2017-06-28 MED ORDER — DEXAMETHASONE SODIUM PHOSPHATE 10 MG/ML IJ SOLN
INTRAMUSCULAR | Status: DC | PRN
Start: 2017-06-28 — End: 2017-06-28
  Administered 2017-06-28: 10 mg via INTRAVENOUS

## 2017-06-28 MED ORDER — CIPROFLOXACIN IN D5W 400 MG/200ML IV SOLN
INTRAVENOUS | Status: AC
  Filled 2017-06-28: qty 200

## 2017-06-28 MED ORDER — FENTANYL CITRATE (PF) 100 MCG/2ML IJ SOLN
INTRAMUSCULAR | Status: AC
  Filled 2017-06-28: qty 2

## 2017-06-28 MED ORDER — LACTATED RINGERS IV SOLN
INTRAVENOUS | Status: DC | PRN
  Administered 2017-06-28: 07:00:00 via INTRAVENOUS

## 2017-06-28 MED ORDER — SCOPOLAMINE 1 MG/3DAYS TD PT72
1.0000 | MEDICATED_PATCH | Freq: Once | TRANSDERMAL | Status: DC
  Administered 2017-06-28: 1.5 mg via TRANSDERMAL

## 2017-06-28 MED ORDER — MEPERIDINE HCL 25 MG/ML IJ SOLN: 6.2500 mg | INTRAMUSCULAR | Status: DC | PRN

## 2017-06-28 MED ORDER — ACETAMINOPHEN 325 MG PO TABS
650.0000 mg | ORAL_TABLET | ORAL | Status: DC | PRN
  Administered 2017-06-28: 650 mg via ORAL

## 2017-06-28 MED ORDER — OXYCODONE HCL 5 MG PO TABS: 5.0000 mg | ORAL_TABLET | Freq: Four times a day (QID) | ORAL | 0 refills | Status: DC | PRN

## 2017-06-28 MED ORDER — ROCURONIUM BROMIDE 10 MG/ML (PF) SYRINGE
PREFILLED_SYRINGE | INTRAVENOUS | Status: AC
  Filled 2017-06-28: qty 5

## 2017-06-28 MED ORDER — ACETAMINOPHEN 650 MG RE SUPP: 650.0000 mg | RECTAL | Status: DC | PRN

## 2017-06-28 MED ORDER — BUPIVACAINE-EPINEPHRINE (PF) 0.25% -1:200000 IJ SOLN
INTRAMUSCULAR | Status: AC
  Filled 2017-06-28: qty 30

## 2017-06-28 MED ORDER — LIDOCAINE HCL (CARDIAC) 20 MG/ML IV SOLN
INTRAVENOUS | Status: DC | PRN
  Administered 2017-06-28: 100 mg via INTRAVENOUS

## 2017-06-28 MED ORDER — BUPIVACAINE-EPINEPHRINE 0.25% -1:200000 IJ SOLN
INTRAMUSCULAR | Status: DC | PRN
  Administered 2017-06-28: 20 mL

## 2017-06-28 MED ORDER — LIDOCAINE 2% (20 MG/ML) 5 ML SYRINGE
INTRAMUSCULAR | Status: AC
  Filled 2017-06-28: qty 5

## 2017-06-28 MED ORDER — ONDANSETRON HCL 4 MG/2ML IJ SOLN: 4.0000 mg | Freq: Once | INTRAMUSCULAR | Status: DC | PRN

## 2017-06-28 MED ORDER — PROPOFOL 500 MG/50ML IV EMUL
INTRAVENOUS | Status: DC | PRN
Start: 2017-06-28 — End: 2017-06-28
  Administered 2017-06-28: 125 ug/kg/min via INTRAVENOUS

## 2017-06-28 MED ORDER — MIDAZOLAM HCL 2 MG/2ML IJ SOLN
INTRAMUSCULAR | Status: AC
  Filled 2017-06-28: qty 2

## 2017-06-28 MED ORDER — FENTANYL CITRATE (PF) 100 MCG/2ML IJ SOLN
INTRAMUSCULAR | Status: DC | PRN
  Administered 2017-06-28: 150 ug via INTRAVENOUS
  Administered 2017-06-28 (×2): 50 ug via INTRAVENOUS

## 2017-06-28 MED ORDER — PROPOFOL 10 MG/ML IV BOLUS
INTRAVENOUS | Status: AC
  Filled 2017-06-28: qty 40

## 2017-06-28 MED ORDER — FENTANYL CITRATE (PF) 100 MCG/2ML IJ SOLN
25.0000 ug | INTRAMUSCULAR | Status: DC | PRN
  Administered 2017-06-28: 50 ug via INTRAVENOUS

## 2017-06-28 MED ORDER — ONDANSETRON HCL 4 MG/2ML IJ SOLN
INTRAMUSCULAR | Status: AC
  Filled 2017-06-28: qty 2

## 2017-06-28 MED ORDER — MIDAZOLAM HCL 5 MG/5ML IJ SOLN
INTRAMUSCULAR | Status: DC | PRN
Start: 2017-06-28 — End: 2017-06-28
  Administered 2017-06-28: 2 mg via INTRAVENOUS

## 2017-06-28 MED ORDER — 0.9 % SODIUM CHLORIDE (POUR BTL) OPTIME
TOPICAL | Status: DC | PRN
  Administered 2017-06-28: 1000 mL

## 2017-06-28 MED ORDER — CIPROFLOXACIN IN D5W 400 MG/200ML IV SOLN
400.0000 mg | INTRAVENOUS | Status: AC
  Administered 2017-06-28: 400 mg via INTRAVENOUS

## 2017-06-28 MED ORDER — SODIUM CHLORIDE 0.9 % IR SOLN
Status: DC | PRN
  Administered 2017-06-28: 1000 mL

## 2017-06-28 MED ORDER — ACETAMINOPHEN 325 MG PO TABS
ORAL_TABLET | ORAL | Status: AC
  Filled 2017-06-28: qty 2

## 2017-06-28 MED ORDER — KETOROLAC TROMETHAMINE 30 MG/ML IJ SOLN
INTRAMUSCULAR | Status: DC | PRN
  Administered 2017-06-28: 30 mg via INTRAVENOUS

## 2017-06-28 MED ORDER — KETOROLAC TROMETHAMINE 30 MG/ML IJ SOLN
INTRAMUSCULAR | Status: AC
  Filled 2017-06-28: qty 1

## 2017-06-28 MED ORDER — FENTANYL CITRATE (PF) 250 MCG/5ML IJ SOLN
INTRAMUSCULAR | Status: AC
  Filled 2017-06-28: qty 5

## 2017-06-28 MED ORDER — ROCURONIUM BROMIDE 100 MG/10ML IV SOLN
INTRAVENOUS | Status: DC | PRN
  Administered 2017-06-28: 50 mg via INTRAVENOUS

## 2017-06-28 MED ORDER — ONDANSETRON HCL 4 MG/2ML IJ SOLN
INTRAMUSCULAR | Status: DC | PRN
  Administered 2017-06-28: 4 mg via INTRAVENOUS

## 2017-06-28 MED ORDER — SUGAMMADEX SODIUM 200 MG/2ML IV SOLN
INTRAVENOUS | Status: DC | PRN
  Administered 2017-06-28: 350 mg via INTRAVENOUS

## 2017-06-28 MED ORDER — SCOPOLAMINE 1 MG/3DAYS TD PT72
MEDICATED_PATCH | TRANSDERMAL | Status: AC
  Administered 2017-06-28: 1.5 mg via TRANSDERMAL
  Filled 2017-06-28: qty 1

## 2017-06-28 MED ORDER — DEXAMETHASONE SODIUM PHOSPHATE 10 MG/ML IJ SOLN
INTRAMUSCULAR | Status: AC
  Filled 2017-06-28: qty 1

## 2017-06-28 MED ORDER — PROPOFOL 10 MG/ML IV BOLUS
INTRAVENOUS | Status: DC | PRN
  Administered 2017-06-28: 160 mg via INTRAVENOUS

## 2017-06-28 SURGICAL SUPPLY — 35 items
ADH SKN CLS APL DERMABOND .7 (GAUZE/BANDAGES/DRESSINGS) ×1
APPLIER CLIP 5 13 M/L LIGAMAX5 (MISCELLANEOUS) ×2
APR CLP MED LRG 5 ANG JAW (MISCELLANEOUS) ×1
BAG SPEC RTRVL LRG 6X4 10 (ENDOMECHANICALS) ×1
CANISTER SUCT 3000ML PPV (MISCELLANEOUS) ×2 IMPLANT
CHLORAPREP W/TINT 26ML (MISCELLANEOUS) ×2 IMPLANT
CLIP APPLIE 5 13 M/L LIGAMAX5 (MISCELLANEOUS) ×1 IMPLANT
COVER SURGICAL LIGHT HANDLE (MISCELLANEOUS) ×2 IMPLANT
DERMABOND ADVANCED (GAUZE/BANDAGES/DRESSINGS) ×1
DERMABOND ADVANCED .7 DNX12 (GAUZE/BANDAGES/DRESSINGS) ×1 IMPLANT
ELECT REM PT RETURN 9FT ADLT (ELECTROSURGICAL) ×2
ELECTRODE REM PT RTRN 9FT ADLT (ELECTROSURGICAL) ×1 IMPLANT
GLOVE BIO SURGEON STRL SZ7.5 (GLOVE) ×1 IMPLANT
GLOVE BIOGEL PI IND STRL 7.5 (GLOVE) IMPLANT
GLOVE BIOGEL PI INDICATOR 7.5 (GLOVE) ×1
GLOVE SURG SIGNA 7.5 PF LTX (GLOVE) ×2 IMPLANT
GOWN STRL REUS W/ TWL LRG LVL3 (GOWN DISPOSABLE) ×2 IMPLANT
GOWN STRL REUS W/ TWL XL LVL3 (GOWN DISPOSABLE) ×1 IMPLANT
GOWN STRL REUS W/TWL LRG LVL3 (GOWN DISPOSABLE) ×4
GOWN STRL REUS W/TWL XL LVL3 (GOWN DISPOSABLE) ×2
KIT BASIN OR (CUSTOM PROCEDURE TRAY) ×2 IMPLANT
KIT ROOM TURNOVER OR (KITS) ×2 IMPLANT
NS IRRIG 1000ML POUR BTL (IV SOLUTION) ×2 IMPLANT
PAD ARMBOARD 7.5X6 YLW CONV (MISCELLANEOUS) ×2 IMPLANT
POUCH SPECIMEN RETRIEVAL 10MM (ENDOMECHANICALS) ×2 IMPLANT
SCISSORS LAP 5X35 DISP (ENDOMECHANICALS) ×2 IMPLANT
SET IRRIG TUBING LAPAROSCOPIC (IRRIGATION / IRRIGATOR) ×2 IMPLANT
SLEEVE ENDOPATH XCEL 5M (ENDOMECHANICALS) ×4 IMPLANT
SPECIMEN JAR SMALL (MISCELLANEOUS) ×2 IMPLANT
SUT MNCRL AB 4-0 PS2 18 (SUTURE) ×2 IMPLANT
TOWEL OR 17X24 6PK STRL BLUE (TOWEL DISPOSABLE) ×2 IMPLANT
TRAY LAPAROSCOPIC MC (CUSTOM PROCEDURE TRAY) ×2 IMPLANT
TROCAR XCEL BLUNT TIP 100MML (ENDOMECHANICALS) ×2 IMPLANT
TROCAR XCEL NON-BLD 5MMX100MML (ENDOMECHANICALS) ×2 IMPLANT
TUBING INSUFFLATION (TUBING) ×2 IMPLANT

## 2017-06-28 NOTE — Anesthesia Postprocedure Evaluation (Signed)
Anesthesia Post Note  Patient: Sandra [name]  Procedure(s) Performed: LAPAROSCOPIC CHOLECYSTECTOMY (N/A Abdomen)     Patient location during evaluation: PACU Anesthesia Type: General Level of consciousness: awake and alert Pain management: pain level controlled Vital Signs Assessment: post-procedure vital signs reviewed and stable Respiratory status: spontaneous breathing, nonlabored ventilation, respiratory function stable and patient connected to nasal cannula oxygen Cardiovascular status: blood pressure returned to baseline and stable Postop Assessment: no apparent nausea or vomiting Anesthetic complications: no    Last Vitals:  Vitals:   06/28/17 0921 06/28/17 0945  BP: (!) 87/72 95/65  Pulse: 65 66  Resp:    Temp:    SpO2: 97% 98%    Last Pain:  Vitals:   06/28/17 0921  TempSrc:   PainSc: 5                  Kaymen Adrian

## 2017-06-28 NOTE — Anesthesia Procedure Notes (Signed)
Procedure Name: Intubation Date/Time: 06/28/2017 7:40 AM Performed by: Candis Shine, CRNA Pre-anesthesia Checklist: Patient identified, Emergency Drugs available, Suction available and Patient being monitored Patient Re-evaluated:Patient Re-evaluated prior to induction Oxygen Delivery Method: Circle System Utilized Preoxygenation: Pre-oxygenation with 100% oxygen Induction Type: IV induction Ventilation: Mask ventilation without difficulty Laryngoscope Size: Mac and 3 Grade View: Grade I Tube type: Oral Tube size: 7.0 mm Number of attempts: 1 Airway Equipment and Method: Stylet and Oral airway Placement Confirmation: ETT inserted through vocal cords under direct vision,  positive ETCO2 and breath sounds checked- equal and bilateral Secured at: 22 cm Tube secured with: Tape Dental Injury: Teeth and Oropharynx as per pre-operative assessment

## 2017-06-28 NOTE — Interval H&P Note (Signed)
History and Physical Interval Note:no change in H and P  06/28/2017 7:07 AM  Sandra [name]  has presented today for surgery, with the diagnosis of BILIARY DYSKINESIA  The various methods of treatment have been discussed with the patient and family. After consideration of risks, benefits and other options for treatment, the patient has consented to  Procedure(s): LAPAROSCOPIC CHOLECYSTECTOMY (N/A) as a surgical intervention .  The patient's history has been reviewed, patient examined, no change in status, stable for surgery.  I have reviewed the patient's chart and labs.  Questions were answered to the patient's satisfaction.     [name] A

## 2017-06-28 NOTE — Op Note (Signed)

## 2017-06-28 NOTE — Discharge Instructions (Signed)
[facility] ______[facility] LAPAROSCOPIC SURGERY: POST OP INSTRUCTIONS Always review your discharge instruction sheet given to you by the facility where your surgery was performed. IF YOU HAVE DISABILITY OR FAMILY LEAVE FORMS, YOU MUST BRING THEM TO THE OFFICE FOR PROCESSING.   DO NOT GIVE THEM TO YOUR DOCTOR.  1. A prescription for pain medication may be given to you upon discharge.  Take your pain medication as prescribed, if needed.  If narcotic pain medicine is not needed, then you may take acetaminophen (Tylenol) or ibuprofen (Advil) as needed. 2. Take your usually prescribed medications unless otherwise directed. 3. If you need a refill on your pain medication, please contact your pharmacy.  They will contact our office to request authorization. Prescriptions will not be filled after 5pm or on week-ends. 4. You should follow a light diet the first few days after arrival home, such as soup and crackers, etc.  Be sure to include lots of fluids daily. 5. Most patients will experience some swelling and bruising in the area of the incisions.  Ice packs will help.  Swelling and bruising can take several days to resolve.  6. It is common to experience some constipation if taking pain medication after surgery.  Increasing fluid intake and taking a stool softener (such as Colace) will usually help or prevent this problem from occurring.  A mild laxative (Milk of Magnesia or Miralax) should be taken according to package instructions if there are no bowel movements after 48 hours. 7. Unless discharge instructions indicate otherwise, you may remove your bandages 24-48 hours after surgery, and you may shower at that time.  You may have steri-strips (small skin tapes) in place directly over the incision.  These strips should be left on the skin for 7-10 days.  If your surgeon used skin glue on the incision, you may shower in 24 hours.  The glue will flake off over the next 2-3 weeks.  Any sutures or  staples will be removed at the office during your follow-up visit. 8. ACTIVITIES:  You may resume regular (light) daily activities beginning the next day--such as daily self-care, walking, climbing stairs--gradually increasing activities as tolerated.  You may have sexual intercourse when it is comfortable.  Refrain from any heavy lifting or straining until approved by your doctor. a. You may drive when you are no longer taking prescription pain medication, you can comfortably wear a seatbelt, and you can safely maneuver your car and apply brakes. b. RETURN TO WORK:  _______ONE WEEK___________________________________________________ 9. You should see your doctor in the office for a follow-up appointment approximately 2-3 weeks after your surgery.  Make sure that you call for this appointment within a day or two after you arrive home to insure a convenient appointment time. 10. OTHER INSTRUCTIONS: _OK TO SHOWER TOMORROW 11. ICE PACK, TYLENOL, IBUPROFEN ALSO FOR PAIN_________________________________________________________________________________________________________________________ __________________________________________________________________________________________________________________________ WHEN TO CALL YOUR DOCTOR: 1. Fever over 101.0 2. Inability to urinate 3. Continued bleeding from incision. 4. Increased pain, redness, or drainage from the incision. 5. Increasing abdominal pain  The clinic staff is available to answer your questions during regular business hours.  Please dont hesitate to call and ask to speak to one of the nurses for clinical concerns.  If you have a medical emergency, go to the nearest emergency room or call 911.  A surgeon from [facility] is always on call at the hospital. [address], [place], [place], [place]  [address] ? [address] [address], [place], [place]  [address] [phone] ? [phone] ? FAX [phone] Web site:  [email or web address]

## 2017-06-28 NOTE — Transfer of Care (Signed)
Immediate Anesthesia Transfer of Care Note  Patient: Sandra [name]  Procedure(s) Performed: LAPAROSCOPIC CHOLECYSTECTOMY (N/A Abdomen)  Patient Location: PACU  Anesthesia Type:General  Level of Consciousness: awake, alert  and oriented  Airway & Oxygen Therapy: Patient Spontanous Breathing and Patient connected to nasal cannula oxygen  Post-op Assessment: Report given to RN and Post -op Vital signs reviewed and stable  Post vital signs: Reviewed and stable  Last Vitals:  Vitals:   06/28/17 0612 06/28/17 0825  BP: 115/63   Pulse: 73   Resp: 18   Temp: 36.7 C (P) 36.9 C  SpO2: 100%     Last Pain:  Vitals:   06/28/17 0630  TempSrc:   PainSc: 5       Patients Stated Pain Goal: 2 (16/01/09 3235)  Complications: No apparent anesthesia complications

## 2017-06-29 ENCOUNTER — Encounter (HOSPITAL_COMMUNITY): Payer: Self-pay | Admitting: Surgery

## 2017-07-12 DIAGNOSIS — N3281 Overactive bladder: Secondary | ICD-10-CM | POA: Diagnosis not present

## 2017-07-12 DIAGNOSIS — R3915 Urgency of urination: Secondary | ICD-10-CM | POA: Diagnosis not present

## 2017-07-27 DIAGNOSIS — D869 Sarcoidosis, unspecified: Secondary | ICD-10-CM

## 2017-07-27 HISTORY — DX: Sarcoidosis, unspecified: D86.9

## 2017-08-02 DIAGNOSIS — R3915 Urgency of urination: Secondary | ICD-10-CM | POA: Diagnosis not present

## 2017-08-02 DIAGNOSIS — N3281 Overactive bladder: Secondary | ICD-10-CM | POA: Diagnosis not present

## 2017-08-16 DIAGNOSIS — M791 Myalgia, unspecified site: Secondary | ICD-10-CM | POA: Diagnosis not present

## 2017-08-16 DIAGNOSIS — M5126 Other intervertebral disc displacement, lumbar region: Secondary | ICD-10-CM | POA: Diagnosis not present

## 2017-08-16 DIAGNOSIS — M545 Low back pain: Secondary | ICD-10-CM | POA: Diagnosis not present

## 2017-08-23 DIAGNOSIS — M5126 Other intervertebral disc displacement, lumbar region: Secondary | ICD-10-CM | POA: Diagnosis not present

## 2017-08-23 DIAGNOSIS — M791 Myalgia, unspecified site: Secondary | ICD-10-CM | POA: Diagnosis not present

## 2017-08-23 DIAGNOSIS — M545 Low back pain: Secondary | ICD-10-CM | POA: Diagnosis not present

## 2017-08-25 ENCOUNTER — Ambulatory Visit (INDEPENDENT_AMBULATORY_CARE_PROVIDER_SITE_OTHER): Payer: 59 | Admitting: Medical

## 2017-08-25 ENCOUNTER — Encounter: Payer: Self-pay | Admitting: Medical

## 2017-08-25 ENCOUNTER — Ambulatory Visit
Admission: RE | Admit: 2017-08-25 | Discharge: 2017-08-25 | Disposition: A | Discharge status: Home / Self Care | Payer: 59 | Source: Ambulatory Visit | Attending: Medical | Admitting: Medical

## 2017-08-25 VITALS — BP 110/72 | HR 80 | Ht 62.0 in | Wt 198.6 lb

## 2017-08-25 DIAGNOSIS — R0681 Apnea, not elsewhere classified: Secondary | ICD-10-CM | POA: Diagnosis not present

## 2017-08-25 DIAGNOSIS — K5900 Constipation, unspecified: Secondary | ICD-10-CM

## 2017-08-25 DIAGNOSIS — R0683 Snoring: Secondary | ICD-10-CM

## 2017-08-25 DIAGNOSIS — R9389 Abnormal findings on diagnostic imaging of other specified body structures: Secondary | ICD-10-CM

## 2017-08-25 DIAGNOSIS — N3281 Overactive bladder: Secondary | ICD-10-CM | POA: Diagnosis not present

## 2017-08-25 DIAGNOSIS — R5383 Other fatigue: Secondary | ICD-10-CM | POA: Insufficient documentation

## 2017-08-25 DIAGNOSIS — R400 Somnolence: Secondary | ICD-10-CM | POA: Diagnosis not present

## 2017-08-25 DIAGNOSIS — R590 Localized enlarged lymph nodes: Secondary | ICD-10-CM | POA: Diagnosis not present

## 2017-08-25 MED ORDER — LINACLOTIDE 72 MCG PO CAPS: 72.0000 ug | ORAL_CAPSULE | Freq: Every day | ORAL | 0 refills | Status: DC

## 2017-08-25 NOTE — Progress Notes (Signed)
Subjective: Chief Complaint  Patient presents with  . New Patient (Initial Visit)    pt had chest x-ray told to have a doctor to look at results, constipation due meds    Here as a new patient today.  She denies having a recent PCP anywhere, but she does see urology at [facility] urology at [facility].  She also sees a Restaurant manager, fast food and physical therapist.  She has been getting routine therapy sessions through her chiropractor and therapist but recently had an x-ray done of her back.  The x-ray suggested possible heart enlargement or abnormality.  They recommended she establish care with her primary care and have this evaluated  she denies chest pain, shortness of breath, edema, palpitations, syncope.  She does endorse snoring, witnessed apnea spells, daytime somnolence, non-restful sleep, fatigue.  No prior sleep study.  Her mother has a history of sleep apnea.  She does report awakening abruptly in her sleep and sometimes with cough spells that wake her up  She had her gallbladder taken out in December.  Since then she has had problems with her bowel movements.  Instead of getting loose stools she gets constipated somewhat regularly.  She also has been on medicine for overactive bladder which seems to worsen the problem of constipation.  She was on Vesicare prior, currently taken [place].  As of today she notes no significant bowel movement in the last 3 weeks other than a couple pebbles.  She is tried MiraLAX with no success.  She reports that she drinks plenty of water and gets extra fiber  She had a biometric lab screen at work last year that was reportedly normal other than prediabetes  Past Medical History:  Diagnosis Date  . Complication of anesthesia   . GERD (gastroesophageal reflux disease)   . PONV (postoperative nausea and vomiting)    "patch works well."   Current Outpatient Medications on File Prior to Visit  Medication Sig Dispense Refill  . dexlansoprazole (DEXILANT) 60  MG capsule Take 60 mg by mouth 2 (two) times daily.    Marland Kitchen docusate sodium (COLACE) 100 MG capsule Take 100 mg by mouth every other day.    . ranitidine (ZANTAC) 150 MG tablet Take 150 mg by mouth 2 (two) times daily as needed for heartburn.    . TOVIAZ 8 MG TB24 tablet TK 1 T PO 2 HOURS PRIOR TO BEDTIME  3   No current facility-administered medications on file prior to visit.    Past Surgical History:  Procedure Laterality Date  . BREAST EXCISIONAL BIOPSY Right    2015  . BREAST EXCISIONAL BIOPSY Left    2007 Papilloma removed  . BREAST SURGERY     lt breast hematoma  . CESAREAN SECTION     x 2  . CHOLECYSTECTOMY N/A 06/28/2017   Procedure: LAPAROSCOPIC CHOLECYSTECTOMY;  Surgeon: Coralie Keens, MD;  Location: [facility];  Service: General;  Laterality: N/A;  . ESOPHAGOGASTRODUODENOSCOPY    . HERNIA REPAIR     Umbilical Hernia  . WISDOM TOOTH EXTRACTION     2   Family History  Problem Relation Age of Onset  . Asthma Mother   . Heart murmur Mother   . Diabetes Mother   . Sleep apnea Mother    ROS as in subjective  Objective: BP 110/72   Pulse 80   Ht 5\' 2"  (1.575 m)   Wt 198 lb 9.6 oz (90.1 kg)   SpO2 96%   BMI 36.32 kg/m  General appearance: alert, no distress, WD/WN, AA female HEENT: normocephalic, sclerae anicteric, TMs pearly, nares patent, no discharge or erythema, pharynx normal Oral cavity: MMM, no lesions Neck: supple, no lymphadenopathy, no thyromegaly, no masses Heart: RRR, normal S1, S2, no murmurs Lungs: CTA bilaterally, no wheezes, rhonchi, or rales Abdomen: +bs, soft, non tender, non distended, no masses, no hepatomegaly, no splenomegaly Pulses: 2+ symmetric, upper and lower extremities, normal cap refill Ext: no edema Neuro: CN2-12 intact, nonfocal exam   Adult ECG Report  Indication: abnormal cardiac silhouette on CXR  Rate: 69 bpm  Rhythm: normal sinus rhythm  QRS Axis: -7 degrees  PR Interval: 125ms  QRS Duration: 39ms  QTc: 459ms   Conduction Disturbances: none  Other Abnormalities: none  Patient's cardiac risk factors are: none.  EKG comparison: none  Narrative Interpretation: normal EKG Dr. Redmond School supervising physician reviewed as well.   Assessment: Encounter Diagnoses  Name Primary?  . Abnormal chest x-ray Yes  . Constipation, unspecified constipation type   . OAB (overactive bladder)   . Witnessed apneic spells   . Snoring   . Daytime somnolence   . Other fatigue      Plan: I reviewed the copy of the x-ray films she brought in which is not optimal.  EKG reviewed.  I will send for an updated x-ray at [facility]  She has symptoms that suggest possible sleep apnea.  I advised she check her insurance coverage about a sleep study, and she can let me know if agreeable to refer for this test  counseled on diet and exercise and working to lose weight  We discussed the many causes of fatigue.  She will return soon for preventive care physical visit and fasting labs  Overactive bladder-advise she follow-up with her urologist to discuss other options or recheck on the current treatment  Constipation- gave short-term supply of Linzess to try, discussed risk and benefits of medication.  Discussed water and fiber intake.  Return soon for physical and recheck on constipation   Sandra [name] was seen today for new patient (initial visit).  Diagnoses and all orders for this visit:  Abnormal chest x-ray -     DG Chest 2 View; Future -     EKG 12-Lead  Constipation, unspecified constipation type  OAB (overactive bladder)  Witnessed apneic spells -     DG Chest 2 View; Future  Snoring  Daytime somnolence  Other fatigue  Other orders -     linaclotide (LINZESS) 72 MCG capsule; Take 1 capsule (72 mcg total) by mouth daily before breakfast.

## 2017-08-30 DIAGNOSIS — M5126 Other intervertebral disc displacement, lumbar region: Secondary | ICD-10-CM | POA: Diagnosis not present

## 2017-08-30 DIAGNOSIS — M545 Low back pain: Secondary | ICD-10-CM | POA: Diagnosis not present

## 2017-08-30 DIAGNOSIS — M791 Myalgia, unspecified site: Secondary | ICD-10-CM | POA: Diagnosis not present

## 2017-09-01 ENCOUNTER — Ambulatory Visit (INDEPENDENT_AMBULATORY_CARE_PROVIDER_SITE_OTHER): Payer: 59 | Admitting: Medical

## 2017-09-01 ENCOUNTER — Encounter: Payer: Self-pay | Admitting: Medical

## 2017-09-01 ENCOUNTER — Other Ambulatory Visit (HOSPITAL_COMMUNITY)
Admission: RE | Admit: 2017-09-01 | Discharge: 2017-09-01 | Disposition: A | Discharge status: Home / Self Care | Payer: 59 | Source: Ambulatory Visit | Attending: Medical | Admitting: Medical

## 2017-09-01 VITALS — BP 110/80 | HR 80 | Wt 200.0 lb

## 2017-09-01 DIAGNOSIS — R42 Dizziness and giddiness: Secondary | ICD-10-CM

## 2017-09-01 DIAGNOSIS — R400 Somnolence: Secondary | ICD-10-CM | POA: Diagnosis not present

## 2017-09-01 DIAGNOSIS — Z0000 Encounter for general adult medical examination without abnormal findings: Secondary | ICD-10-CM | POA: Insufficient documentation

## 2017-09-01 DIAGNOSIS — Z2821 Immunization not carried out because of patient refusal: Secondary | ICD-10-CM

## 2017-09-01 DIAGNOSIS — Z7189 Other specified counseling: Secondary | ICD-10-CM | POA: Diagnosis not present

## 2017-09-01 DIAGNOSIS — R9389 Abnormal findings on diagnostic imaging of other specified body structures: Secondary | ICD-10-CM

## 2017-09-01 DIAGNOSIS — Z124 Encounter for screening for malignant neoplasm of cervix: Secondary | ICD-10-CM

## 2017-09-01 DIAGNOSIS — R0681 Apnea, not elsewhere classified: Secondary | ICD-10-CM

## 2017-09-01 DIAGNOSIS — Z7185 Encounter for immunization safety counseling: Secondary | ICD-10-CM

## 2017-09-01 DIAGNOSIS — N6009 Solitary cyst of unspecified breast: Secondary | ICD-10-CM | POA: Diagnosis not present

## 2017-09-01 DIAGNOSIS — G4489 Other headache syndrome: Secondary | ICD-10-CM

## 2017-09-01 DIAGNOSIS — N3281 Overactive bladder: Secondary | ICD-10-CM | POA: Diagnosis not present

## 2017-09-01 DIAGNOSIS — Z113 Encounter for screening for infections with a predominantly sexual mode of transmission: Secondary | ICD-10-CM

## 2017-09-01 DIAGNOSIS — N946 Dysmenorrhea, unspecified: Secondary | ICD-10-CM

## 2017-09-01 DIAGNOSIS — R5383 Other fatigue: Secondary | ICD-10-CM

## 2017-09-01 DIAGNOSIS — R0602 Shortness of breath: Secondary | ICD-10-CM

## 2017-09-01 LAB — POCT URINALYSIS DIP (PROADVANTAGE DEVICE)
Bilirubin, UA: NEGATIVE
Blood, UA: NEGATIVE
Glucose, UA: NEGATIVE mg/dL
Ketones, POC UA: NEGATIVE mg/dL
Leukocytes, UA: NEGATIVE
Nitrite, UA: NEGATIVE
Protein Ur, POC: NEGATIVE mg/dL
Specific Gravity, Urine: 1.02
Urobilinogen, Ur: NEGATIVE
pH, UA: 6 (ref 5.0–8.0)

## 2017-09-01 NOTE — Assessment & Plan Note (Signed)
Consider sleep study. 

## 2017-09-01 NOTE — Assessment & Plan Note (Addendum)
Discussed STD testing, prevention, condom use, means of transmission

## 2017-09-01 NOTE — Assessment & Plan Note (Signed)
Hx/o, improved on IUD

## 2017-09-01 NOTE — Assessment & Plan Note (Signed)
Reviewed xray from last visit.  Discussed possible differential including sarcoid, lymphoma, tumor.  Pending labs, likely referral to pulmonology and/or chest CT.

## 2017-09-01 NOTE — Assessment & Plan Note (Signed)
Pap sent ?

## 2017-09-01 NOTE — Progress Notes (Addendum)
Subjective: Chief Complaint  Patient presents with  . Annual Exam    physical and discuss x-rays, and pap   Medical Team Dr. Collene Mares, Gastroenterology [facility], Urology Dentist, [facility] Eye doctor, [place] Maya, Camelia Eng, [place] here for primary care, established recently  Concerns: Pregnancy history: 2 pregnancies, 2 live births, on Mirena for heavy periods and contraception.  Was placed 01/31/2015, due to remove 01/31/2020.  LMP was 01/31/15 when Mirena was placed.  Here to discuss chest   Sometimes feels a tightness in left axilla/chest.  Sometimes feels palpitations.  yesterday while exercising and heart rate got to 177, felt tingling in left arm and tightness in chest /armpit. Sometimes feels light headed.  Recently felt lightheaded with sex, and has never felt that way before, been with boyfriend 5 years.  Does get bad headaches, sometimes last 2 days.   If not wearing glasses, gets headaches daily, if wearing glasses, still gets some headaches.  Sometimes skip meals and gets headaches.    Last visit had constipation related to [place].  Her urologist since changed her back to [place].  She only tried Linzess once since visit and this resolved her symptoms.   Hx/o abnormal pap in teens, had cryotherapy, and has had normal pap smears since.   Reviewed their medical, surgical, family, social, medication, and allergy history and updated chart as appropriate.  Past Medical History:  Diagnosis Date  . Complication of anesthesia   . GERD (gastroesophageal reflux disease)   . OAB (overactive bladder)   . PONV (postoperative nausea and vomiting)    "patch works well."    Past Surgical History:  Procedure Laterality Date  . BREAST EXCISIONAL BIOPSY Right    2015  . BREAST EXCISIONAL BIOPSY Left    2007 Papilloma removed  . BREAST SURGERY     lt breast hematoma  . CESAREAN SECTION     x 2  . CHOLECYSTECTOMY N/A 06/28/2017   Procedure: LAPAROSCOPIC CHOLECYSTECTOMY;   Surgeon: Coralie Keens, MD;  Location: [place];  Service: General;  Laterality: N/A;  . ESOPHAGOGASTRODUODENOSCOPY  05/2017   Dr. Collene Mares  . HERNIA REPAIR     Umbilical Hernia  . WISDOM TOOTH EXTRACTION     2    Social History   Socioeconomic History  . Marital status: Significant Other    Spouse name: Not on file  . Number of children: Not on file  . Years of education: Not on file  . Highest education level: Not on file  Social Needs  . Financial resource strain: Not on file  . Food insecurity - worry: Not on file  . Food insecurity - inability: Not on file  . Transportation needs - medical: Not on file  . Transportation needs - non-medical: Not on file  Occupational History  . Not on file  Tobacco Use  . Smoking status: Never Smoker  . Smokeless tobacco: Never Used  Substance and Sexual Activity  . Alcohol use: Yes    Alcohol/week: 1.2 oz    Types: 2 Shots of liquor per week    Frequency: Never    Comment: ocassional  . Drug use: No  . Sexual activity: Yes    Birth control/protection: Surgical  Other Topics Concern  . Not on file  Social History Narrative   Lives with boyfriend, 21yo and 71yo daughter.  Does Occupational psychologist for [employer].  Exercise - 3+ times per week at the gym.  08/2017    Family History  Problem  Relation Age of Onset  . Asthma Mother   . Heart murmur Mother   . Diabetes Mother   . Sleep apnea Mother   . Cancer Maternal Grandmother        stomach  . Cancer Maternal Grandfather        colon  . Cancer Paternal Grandmother        breast  . Kidney disease Paternal Grandmother        dialysis  . Sarcoidosis Neg Hx   . Heart disease Neg Hx   . Stroke Neg Hx      Current Outpatient Medications:  .  dexlansoprazole (DEXILANT) 60 MG capsule, Take 60 mg by mouth 2 (two) times daily., Disp: , Rfl:  .  levonorgestrel (MIRENA) 20 MCG/24HR IUD, 1 each by Intrauterine route once., Disp: , Rfl:  .  linaclotide (LINZESS) 72 MCG capsule, Take 1  capsule (72 mcg total) by mouth daily before breakfast., Disp: 15 capsule, Rfl: 0 .  ranitidine (ZANTAC) 150 MG tablet, Take 150 mg by mouth 2 (two) times daily as needed for heartburn., Disp: , Rfl:  .  solifenacin (VESICARE) 10 MG tablet, Take 10 mg by mouth daily., Disp: , Rfl:  .  docusate sodium (COLACE) 100 MG capsule, Take 100 mg by mouth every other day., Disp: , Rfl:  .  TOVIAZ 8 MG TB24 tablet, TK 1 T PO 2 HOURS PRIOR TO BEDTIME, Disp: , Rfl: 3  Allergies  Allergen Reactions  . Penicillins Rash    Has patient had a PCN reaction causing immediate rash, facial/tongue/throat swelling, SOB or lightheadedness with hypotension: yes Has patient had a PCN reaction causing severe rash involving mucus membranes or skin necrosis: no Has patient had a PCN reaction that required hospitalization: no Has patient had a PCN reaction occurring within the last 10 years: no If all of the above answers are "NO", then may proceed with Cephalosporin use.   . Influenza Vaccines     illness    Review of Systems Constitutional: -fever, -chills, -sweats, -unexpected weight change, -decreased appetite, -fatigue Allergy: -sneezing, -itching, -congestion Dermatology: -changing moles, --rash, -lumps ENT: -runny nose, -ear pain, -sore throat, -hoarseness, -sinus pain, -teeth pain, - ringing in ears, -hearing loss, -nosebleeds Cardiology: -chest pain, -palpitations, -swelling, -difficulty breathing when lying flat, -waking up short of breath Respiratory: -cough, +shortness of breath, -difficulty breathing with exercise or exertion, -wheezing, -coughing up blood Gastroenterology: -abdominal pain, -nausea, -vomiting, -diarrhea, -constipation, -blood in stool, -changes in bowel movement, -difficulty swallowing or eating Hematology: -bleeding, -bruising  Musculoskeletal: -joint aches, -muscle aches, -joint swelling, -back pain, -neck pain, -cramping, -changes in gait Ophthalmology: denies vision changes, eye  redness, itching, discharge Urology: -burning with urination, -difficulty urinating, -blood in urine, -urinary frequency, -urgency, -incontinence Neurology: -headache, -weakness, -tingling, -numbness, -memory loss, -falls, -dizziness Psychology: -depressed mood, -agitation, -sleep problems Breast/gyn: -breast tendness, -discharge, -lumps, -vaginal discharge,- irregular periods, +hx/o heavy periods, hx/o endometriosis?      Objective:   BP 110/80   Pulse 80   Wt 200 lb (90.7 kg)   LMP  (LMP Unknown)   SpO2 96%   BMI 36.58 kg/m   General appearance: alert, no distress, WD/WN, African American female Skin: scattered macules, no worrisome lesions HEENT: normocephalic, conjunctiva/corneas normal, sclerae anicteric, PERRLA, EOMi, nares patent, no discharge or erythema, pharynx normal Oral cavity: MMM, tongue normal, teeth in good repair Neck: supple, no lymphadenopathy, no thyromegaly, no masses, normal ROM, no bruits Chest: non tender, normal shape and expansion Heart:  RRR, normal S1, S2, no murmurs Lungs: CTA bilaterally, no wheezes, rhonchi, or rales Abdomen: +bs, soft, non tender, surgical port scars central and right upper quadrant, surgical scar umbilicus, non distended, no masses, no hepatomegaly, no splenomegaly, no bruits Back: non tender, normal ROM, no scoliosis Musculoskeletal: upper extremities non tender, no obvious deformity, normal ROM throughout, lower extremities non tender, no obvious deformity, normal ROM throughout Extremities: no edema, no cyanosis, no clubbing Pulses: 2+ symmetric, upper and lower extremities, normal cap refill Neurological: alert, oriented x 3, CN2-12 intact, strength normal upper extremities and lower extremities, sensation normal throughout, DTRs 2+ throughout, no cerebellar signs, gait normal Psychiatric: normal affect, behavior normal, pleasant  Breast: nontender, no masses or lumps, no skin changes, no nipple discharge or inversion, no axillary  lymphadenopathy Gyn: Normal external genitalia without lesions, vagina with normal mucosa, cervix without lesions, IUD strings in place, no cervical motion tenderness, no abnormal vaginal discharge.  Uterus and adnexa not enlarged, nontender, no masses.  Pap performed.  Exam chaperoned by nurse. Rectal: anus normal appearing   Assessment and Plan :    Encounter Diagnoses  Name Primary?  . Encounter for health maintenance examination in adult Yes  . OAB (overactive bladder)   . Routine general medical examination at a health care facility   . Abnormal chest x-ray   . Other fatigue   . Daytime somnolence   . SOB (shortness of breath)   . Dysmenorrhea   . Screening for cervical cancer   . Cyst of breast, unspecified laterality   . Vaccine counseling   . Influenza vaccination declined   . Screen for STD (sexually transmitted disease)   . Witnessed apneic spells   . Headache syndrome   . Dizziness    Discussed and counseled on healthy lifestyle, diet, exercise, preventative care, vaccinations, sick and well care, proper use of emergency dept and after hours care, and addressed their concerns.  They were advised to see their eye doctor and dentist yearly.  Screen for STD (sexually transmitted disease) Discussed STD testing, prevention, condom use, means of transmission  OAB (overactive bladder) Managed by urology, recently changed from [place] back to [place] Hx/o, improved on IUD  Abnormal chest x-ray Reviewed xray from last visit.  Discussed possible differential including sarcoid, lymphoma, tumor.  Pending labs, likely referral to pulmonology and/or chest CT.  Influenza vaccination declined They decline the flu vaccine today despite being counseled on this.  Screening for cervical cancer Pap sent  Vaccine counseling Counseled on flu and Tdap vaccines but she declines  Witnessed apneic spells Consider sleep study  Headache syndrome Consider head imaging,  sleep study  Dizziness Reviewed EKG from last visit.  Consider head imaging, sleep study   Naveena was seen today for annual exam.  Diagnoses and all orders for this visit:  Encounter for health maintenance examination in adult  OAB (overactive bladder)  Routine general medical examination at a health care facility -     POCT Urinalysis DIP (Proadvantage Device) -     Comprehensive metabolic panel -     CBC with Differential/Platelet -     Lipid panel -     TSH -     Hemoglobin A1c -     Sedimentation rate -     HIV antibody -     RPR -     Cytology - PAP  Abnormal chest x-ray -     CBC with Differential/Platelet -     Sedimentation rate  Other fatigue -     TSH  Daytime somnolence  SOB (shortness of breath)  Dysmenorrhea  Screening for cervical cancer -     Cytology - PAP  Cyst of breast, unspecified laterality  Vaccine counseling  Influenza vaccination declined  Screen for STD (sexually transmitted disease)  Witnessed apneic spells  Headache syndrome  Dizziness   Follow-up pending labs, yearly for physical

## 2017-09-01 NOTE — Assessment & Plan Note (Signed)
Counseled on flu and Tdap vaccines but she declines

## 2017-09-01 NOTE — Assessment & Plan Note (Signed)
They decline the flu vaccine today despite being counseled on this.

## 2017-09-01 NOTE — Assessment & Plan Note (Signed)
Managed by urology, recently changed from [place] back to [facility]

## 2017-09-02 ENCOUNTER — Other Ambulatory Visit: Payer: Self-pay | Admitting: Medical

## 2017-09-02 DIAGNOSIS — R42 Dizziness and giddiness: Secondary | ICD-10-CM | POA: Insufficient documentation

## 2017-09-02 DIAGNOSIS — G4452 New daily persistent headache (NDPH): Secondary | ICD-10-CM

## 2017-09-02 DIAGNOSIS — G4489 Other headache syndrome: Secondary | ICD-10-CM | POA: Insufficient documentation

## 2017-09-02 DIAGNOSIS — R590 Localized enlarged lymph nodes: Secondary | ICD-10-CM

## 2017-09-02 LAB — COMPREHENSIVE METABOLIC PANEL
ALT: 32 IU/L (ref 0–32)
AST: 31 IU/L (ref 0–40)
Albumin/Globulin Ratio: 1.3 (ref 1.2–2.2)
Albumin: 4.2 g/dL (ref 3.5–5.5)
Alkaline Phosphatase: 73 IU/L (ref 39–117)
BUN/Creatinine Ratio: 12 (ref 9–23)
BUN: 10 mg/dL (ref 6–24)
Bilirubin Total: 0.5 mg/dL (ref 0.0–1.2)
CO2: 23 mmol/L (ref 20–29)
Calcium: 9.1 mg/dL (ref 8.7–10.2)
Chloride: 101 mmol/L (ref 96–106)
Creatinine, Ser: 0.83 mg/dL (ref 0.57–1.00)
GFR calc Af Amer: 101 mL/min/{1.73_m2} (ref >59)
GFR calc non Af Amer: 88 mL/min/{1.73_m2} (ref >59)
Globulin, Total: 3.2 g/dL (ref 1.5–4.5)
Glucose: 86 mg/dL (ref 65–99)
Potassium: 4.2 mmol/L (ref 3.5–5.2)
Sodium: 139 mmol/L (ref 134–144)
Total Protein: 7.4 g/dL (ref 6.0–8.5)

## 2017-09-02 LAB — CBC WITH DIFFERENTIAL/PLATELET
Basophils Absolute: 0 10*3/uL (ref 0.0–0.2)
Basos: 1 %
EOS (ABSOLUTE): 0.2 10*3/uL (ref 0.0–0.4)
Eos: 5 %
Hematocrit: 38 % (ref 34.0–46.6)
Hemoglobin: 12.5 g/dL (ref 11.1–15.9)
Immature Grans (Abs): 0 10*3/uL (ref 0.0–0.1)
Immature Granulocytes: 0 %
Lymphocytes Absolute: 2.1 10*3/uL (ref 0.7–3.1)
Lymphs: 41 %
MCH: 28.3 pg (ref 26.6–33.0)
MCHC: 32.9 g/dL (ref 31.5–35.7)
MCV: 86 fL (ref 79–97)
Monocytes Absolute: 0.5 10*3/uL (ref 0.1–0.9)
Monocytes: 10 %
Neutrophils Absolute: 2.2 10*3/uL (ref 1.4–7.0)
Neutrophils: 43 %
Platelets: 300 10*3/uL (ref 150–379)
RBC: 4.41 x10E6/uL (ref 3.77–5.28)
RDW: 13.7 % (ref 12.3–15.4)
WBC: 5.1 10*3/uL (ref 3.4–10.8)

## 2017-09-02 LAB — LIPID PANEL
Chol/HDL Ratio: 3.9 ratio (ref 0.0–4.4)
Cholesterol, Total: 186 mg/dL (ref 100–199)
HDL: 48 mg/dL (ref >39)
LDL Calculated: 128 mg/dL — ABNORMAL HIGH (ref 0–99)
Triglycerides: 48 mg/dL (ref 0–149)
VLDL Cholesterol Cal: 10 mg/dL (ref 5–40)

## 2017-09-02 LAB — TSH: TSH: 1.24 u[IU]/mL (ref 0.450–4.500)

## 2017-09-02 LAB — SEDIMENTATION RATE: Sed Rate: 13 mm/hr (ref 0–32)

## 2017-09-02 LAB — HEMOGLOBIN A1C
Est. average glucose Bld gHb Est-mCnc: 120 mg/dL
Hgb A1c MFr Bld: 5.8 % — ABNORMAL HIGH (ref 4.8–5.6)

## 2017-09-02 LAB — RPR: RPR Ser Ql: NONREACTIVE

## 2017-09-02 LAB — HIV ANTIBODY (ROUTINE TESTING W REFLEX): HIV Screen 4th Generation wRfx: NONREACTIVE

## 2017-09-02 MED ORDER — DEXLANSOPRAZOLE 60 MG PO CPDR: 60.0000 mg | DELAYED_RELEASE_CAPSULE | Freq: Two times a day (BID) | ORAL | 2 refills | Status: DC

## 2017-09-02 NOTE — Assessment & Plan Note (Signed)
Consider head imaging, sleep study

## 2017-09-02 NOTE — Assessment & Plan Note (Signed)
Reviewed EKG from last visit.  Consider head imaging, sleep study

## 2017-09-06 DIAGNOSIS — M545 Low back pain: Secondary | ICD-10-CM | POA: Diagnosis not present

## 2017-09-06 DIAGNOSIS — M791 Myalgia, unspecified site: Secondary | ICD-10-CM | POA: Diagnosis not present

## 2017-09-06 DIAGNOSIS — M5126 Other intervertebral disc displacement, lumbar region: Secondary | ICD-10-CM | POA: Diagnosis not present

## 2017-09-06 LAB — CYTOLOGY - PAP
Chlamydia: NEGATIVE
Diagnosis: NEGATIVE
Neisseria Gonorrhea: NEGATIVE

## 2017-09-07 ENCOUNTER — Encounter: Payer: Self-pay | Admitting: Medical

## 2017-09-07 ENCOUNTER — Ambulatory Visit (INDEPENDENT_AMBULATORY_CARE_PROVIDER_SITE_OTHER): Payer: 59 | Admitting: Medical

## 2017-09-07 ENCOUNTER — Telehealth: Payer: Self-pay | Admitting: Medical

## 2017-09-07 ENCOUNTER — Other Ambulatory Visit: Payer: Self-pay

## 2017-09-07 VITALS — BP 122/84 | HR 86 | Temp 98.6°F | Wt 199.6 lb

## 2017-09-07 DIAGNOSIS — R05 Cough: Secondary | ICD-10-CM | POA: Diagnosis not present

## 2017-09-07 DIAGNOSIS — R0602 Shortness of breath: Secondary | ICD-10-CM

## 2017-09-07 DIAGNOSIS — R9389 Abnormal findings on diagnostic imaging of other specified body structures: Secondary | ICD-10-CM | POA: Diagnosis not present

## 2017-09-07 DIAGNOSIS — R059 Cough, unspecified: Secondary | ICD-10-CM

## 2017-09-07 MED ORDER — BENZONATATE 200 MG PO CAPS: 200.0000 mg | ORAL_CAPSULE | Freq: Three times a day (TID) | ORAL | 0 refills | Status: DC | PRN

## 2017-09-07 MED ORDER — ALBUTEROL SULFATE (2.5 MG/3ML) 0.083% IN NEBU
2.5000 mg | INHALATION_SOLUTION | Freq: Once | RESPIRATORY_TRACT | Status: AC
  Administered 2017-09-07: 2.5 mg via RESPIRATORY_TRACT

## 2017-09-07 MED ORDER — PREDNISONE 10 MG PO TABS: ORAL_TABLET | ORAL | 0 refills | Status: DC

## 2017-09-07 NOTE — Progress Notes (Signed)
Subjective: Chief Complaint  Patient presents with  . Shortness of Breath    shortness of breath x 2 days coughing, weakness    Here for cough, SOB.   I saw her recently for physical and concern for abnormal CXR showing lymphadenopathy.   She was feeling fine last visit, but notes in last 2 days having lots of cough, hoarse voice after coughing spells, feels some body aches, some chills, but no headache, no sore throat, no fever, no hemoptysis, no diarrhea.   Doesn't feel real bad, just coughing a lot.   No other aggravating or relieving factors. No other complaint.  Past Medical History:  Diagnosis Date  . Complication of anesthesia   . GERD (gastroesophageal reflux disease)   . OAB (overactive bladder)   . PONV (postoperative nausea and vomiting)    "patch works well."   Current Outpatient Medications on File Prior to Visit  Medication Sig Dispense Refill  . dexlansoprazole (DEXILANT) 60 MG capsule Take 1 capsule (60 mg total) by mouth 2 (two) times daily. 30 capsule 2  . levonorgestrel (MIRENA) 20 MCG/24HR IUD 1 each by Intrauterine route once.    . solifenacin (VESICARE) 10 MG tablet Take 10 mg by mouth daily.     No current facility-administered medications on file prior to visit.    ROS as in subjective   Objective: BP 122/84   Pulse 86   Temp 98.6 F (37 C)   Wt 199 lb 9.6 oz (90.5 kg)   LMP  (LMP Unknown)   SpO2 97%   BMI 36.51 kg/m   General appearance: alert, no distress, WD/WN, coughing a lot, hoarse voice HEENT: normocephalic, sclerae anicteric, TMs pearly, nares patent, no discharge or erythema, pharynx normal Oral cavity: MMM, no lesions Neck: supple, no lymphadenopathy, no thyromegaly, no masses, no JVD Heart: RRR, normal S1, S2, no murmurs Lungs: somewhat decreased breath sounds, no wheezes, rhonchi, or rales Pulses: 2+ symmetric, upper and lower extremities, normal cap refill Ext: no edema, no asymmetry no calve tenderness    Assessment: Encounter  Diagnoses  Name Primary?  . Cough Yes  . SOB (shortness of breath)   . Abnormal chest x-ray      Plan: Reviewed her recent labs and CXR.  Flu negative swab today.  She has possible sarcoidosis or other lymphadenopathy related issue in the chest, but may have developed an URI in the past few days.  1 round of neb treatment today without any significant improvement.    Will begin round of prednisone, cough suppressant as below.   F/u with pulmonology tomorrow regarding abnormal CXR.  No obvious concern for PE, but can't completely rule this out  Sandra [name] was seen today for shortness of breath.  Diagnoses and all orders for this visit:  Cough  SOB (shortness of breath) -     albuterol (PROVENTIL) (2.5 MG/3ML) 0.083% nebulizer solution 2.5 mg  Abnormal chest x-ray  Other orders -     Cancel: Ambulatory referral to Pulmonology -     predniSONE (DELTASONE) 10 MG tablet; 6/5/4/3/2/1 taper -     benzonatate (TESSALON) 200 MG capsule; Take 1 capsule (200 mg total) by mouth 3 (three) times daily as needed for cough.

## 2017-09-07 NOTE — Addendum Note (Signed)
Addended by: Tyrone Apple on: 09/07/2017 12:12 PM   Modules accepted: Orders

## 2017-09-07 NOTE — Telephone Encounter (Signed)
Pt called wating to let Sandra [name] know that pulmonary has not contacted her yet and she wants to know if she can go get CT of her chest now so that she can have that part done for pulmonary

## 2017-09-07 NOTE — Telephone Encounter (Signed)
Called pulmonology made an appt for pt on 09/08/2017 @ 215 pm with Dr. Halford Chessman  At [facility]. Pt is aware of appt.

## 2017-09-08 ENCOUNTER — Ambulatory Visit (INDEPENDENT_AMBULATORY_CARE_PROVIDER_SITE_OTHER): Payer: 59 | Admitting: Pulmonary Disease

## 2017-09-08 ENCOUNTER — Encounter: Payer: Self-pay | Admitting: Pulmonary Disease

## 2017-09-08 VITALS — BP 116/78 | HR 83 | Ht 62.0 in | Wt 197.0 lb

## 2017-09-08 DIAGNOSIS — R590 Localized enlarged lymph nodes: Secondary | ICD-10-CM | POA: Diagnosis not present

## 2017-09-08 NOTE — Progress Notes (Signed)
Subjective:    Patient ID: Maryonna Kanaan, female    DOB: Mar 13, 1976, 42 y.o.   MRN: [id number]  HPI    Review of Systems  Constitutional: Positive for appetite change and unexpected weight change. Negative for fever.  HENT: Positive for sinus pressure, sneezing and sore throat. Negative for congestion, dental problem, ear pain, nosebleeds, postnasal drip, rhinorrhea and trouble swallowing.   Eyes: Negative for redness and itching.  Respiratory: Positive for cough, choking, chest tightness, shortness of breath and wheezing.   Cardiovascular: Negative for palpitations and leg swelling.  Gastrointestinal: Negative for nausea and vomiting.  Genitourinary: Negative for dysuria.  Musculoskeletal: Negative for joint swelling.  Skin: Positive for rash.  Allergic/Immunologic: Negative.  Negative for environmental allergies, food allergies and immunocompromised state.  Neurological: Positive for headaches.  Hematological: Does not bruise/bleed easily.  Psychiatric/Behavioral: Negative for dysphoric mood. The patient is not nervous/anxious.        Objective:   Physical Exam        Assessment & Plan:

## 2017-09-08 NOTE — H&P (View-Only) (Signed)
[facility]  Chief Complaint  Patient presents with  . pulm consult    Pt referred by Dr. Glade Lloyd MD. Pt had abnormal CXR with PCP. Pt has dry cough, SOB, wheezing with coughing, and some chest tightness for last 3 weeks.    Vital signs: BP 116/78 (BP Location: Left Arm, Cuff Size: Normal)   Pulse 83   Ht 5\' 2"  (1.575 m)   Wt 197 lb (89.4 kg)   LMP  (LMP Unknown)   SpO2 97%   BMI 36.03 kg/m   History of Present Illness: Sandra [name] is a 42 y.o. female with cough and hilar adenopathy.  She has noticed a problem with a cough for the past 1 year.  This has been getting worse.  She will wake up from sleep with a cough.  She doesn't bring up sputum.  She has also noticed feeling more short of breath going up stairs.  She has never been told she has asthma.  No history of pneumonia or TB.  She denies animal/bird exposures.  Her boyfriend smokes cigarettes.  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  She is from [place].  No sick exposures or travel history.  She was told she gets eczema in her antecubital areas and around her neck.  She also gets a pain, warm bump on her left lower leg intermittently that will resolve spontaneously.  This has occurred for about the same duration as her cough and dyspnea.  She had a chest xray which showed hilar LAN. She was started on prednisone and this has helped with her symptoms some.   Physical Exam:  General - pleasant Eyes - pupils reactive ENT - no sinus tenderness, no oral exudate, no LAN, mild erythema posterior pharynx Cardiac - regular, no murmur Chest - no wheeze, rales Abd - soft, non tender Ext - no edema Skin - no rashes Neuro - normal strength Psych - normal mood  CMP Latest Ref Rng & Units 09/01/2017  Glucose 65 - 99 mg/dL 86  BUN 6 - 24 mg/dL 10  Creatinine 0.57 - 1.00 mg/dL 0.83  Sodium 134 - 144 mmol/L 139  Potassium 3.5 - 5.2 mmol/L 4.2  Chloride 96 - 106 mmol/L 101    CO2 20 - 29 mmol/L 23  Calcium 8.7 - 10.2 mg/dL 9.1  Total Protein 6.0 - 8.5 g/dL 7.4  Total Bilirubin 0.0 - 1.2 mg/dL 0.5  Alkaline Phos 39 - 117 IU/L 73  AST 0 - 40 IU/L 31  ALT 0 - 32 IU/L 32    CBC Latest Ref Rng & Units 09/01/2017 06/28/2017 01/03/2015  WBC 3.4 - 10.8 x10E3/uL 5.1 5.0 4.6  Hemoglobin 11.1 - 15.9 g/dL 12.5 11.9(L) 10.4(L)  Hematocrit 34.0 - 46.6 % 38.0 36.9 31.4(L)  Platelets 150 - 379 x10E3/uL 300 227 222    Lab Results  Component Value Date   ESRSEDRATE 13 09/01/2017    Dg Chest 2 View  Result Date: 08/25/2017 CLINICAL DATA:  Abnormal radiographs at a chiropractor office. EXAM: CHEST  2 VIEW COMPARISON:  None. FINDINGS: The patient has bilateral hilar adenopathy. Heart size and vascularity are normal. Lungs are clear. Bones are normal. IMPRESSION: Bilateral hilar adenopathy. The differential diagnosis includes sarcoidosis, lymphoma, and other malignancies. Electronically Signed   By: Lorriane Shire M.D.   On: 08/25/2017 17:26    Discussion: She has dry cough, progressive dyspnea, and skin lesion on leg that sounds like erythema nodosum.  Her CXR  shows b/l hilar adenopathy.  I am most concerned that she could have sarcoidosis.  Assessment/Plan:  Hilar adenopathy. - okay to continue course of prednisone for now - will arrange for CT chest with contrast and PFTs - will then determine is she needs to be scheduled for bronchoscopy   Patient Instructions  Will arrange for CT chest with contrast and pulmonary function testing  Follow up in 2 weeks with Dr. Halford Chessman or Nurse Practitioner    Chesley Mires, MD [place] 09/08/2017, 2:58 PM Pager:  [phone]  Flow Sheet  Pulmonary tests:  Review of Systems: Constitutional: Positive for appetite change and unexpected weight change. Negative for fever.  HENT: Positive for sinus pressure, sneezing and sore throat. Negative for congestion, dental problem, ear pain, nosebleeds, postnasal  drip, rhinorrhea and trouble swallowing.   Eyes: Negative for redness and itching.  Respiratory: Positive for cough, choking, chest tightness, shortness of breath and wheezing.   Cardiovascular: Negative for palpitations and leg swelling.  Gastrointestinal: Negative for nausea and vomiting.  Genitourinary: Negative for dysuria.  Musculoskeletal: Negative for joint swelling.  Skin: Positive for rash.  Allergic/Immunologic: Negative.  Negative for environmental allergies, food allergies and immunocompromised state.  Neurological: Positive for headaches.  Hematological: Does not bruise/bleed easily.  Psychiatric/Behavioral: Negative for dysphoric mood. The patient is not nervous/anxious.    Past Medical History: She  has a past medical history of Complication of anesthesia, GERD (gastroesophageal reflux disease), OAB (overactive bladder), and PONV (postoperative nausea and vomiting).  Past Surgical History: She  has a past surgical history that includes Cesarean section; Breast surgery; Breast excisional biopsy (Right); Breast excisional biopsy (Left); Hernia repair; Wisdom tooth extraction; Esophagogastroduodenoscopy (05/2017); and Cholecystectomy (N/A, 06/28/2017).  Family History: Her family history includes Asthma in her mother; Cancer in her maternal grandfather, maternal grandmother, and paternal grandmother; Diabetes in her mother; Heart murmur in her mother; Kidney disease in her paternal grandmother; Sleep apnea in her mother.  Social History: She  reports that  has never smoked. she has never used smokeless tobacco. She reports that she drinks about 2.4 oz of alcohol per week. She reports that she does not use drugs.  Medications: Allergies as of 09/08/2017      Reactions   Penicillins Rash   Has patient had a PCN reaction causing immediate rash, facial/tongue/throat swelling, SOB or lightheadedness with hypotension: yes Has patient had a PCN reaction causing severe rash involving  mucus membranes or skin necrosis: no Has patient had a PCN reaction that required hospitalization: no Has patient had a PCN reaction occurring within the last 10 years: no If all of the above answers are "NO", then may proceed with Cephalosporin use.   Influenza Vaccines    illness      Medication List        Accurate as of 09/08/17  2:58 PM. Always use your most recent med list.          benzonatate 200 MG capsule Commonly known as:  TESSALON Take 1 capsule (200 mg total) by mouth 3 (three) times daily as needed for cough.   dexlansoprazole 60 MG capsule Commonly known as:  DEXILANT Take 1 capsule (60 mg total) by mouth 2 (two) times daily.   levonorgestrel 20 MCG/24HR IUD Commonly known as:  MIRENA 1 each by Intrauterine route once.   predniSONE 10 MG tablet Commonly known as:  DELTASONE 6/5/4/3/2/1 taper   solifenacin 10 MG tablet Commonly known as:  VESICARE Take 10 mg by mouth  daily.

## 2017-09-08 NOTE — Progress Notes (Signed)
[facility]  Chief Complaint  Patient presents with  . pulm consult    Pt referred by Dr. Glade Lloyd MD. Pt had abnormal CXR with PCP. Pt has dry cough, SOB, wheezing with coughing, and some chest tightness for last 3 weeks.    Vital signs: BP 116/78 (BP Location: Left Arm, Cuff Size: Normal)   Pulse 83   Ht 5\' 2"  (1.575 m)   Wt 197 lb (89.4 kg)   LMP  (LMP Unknown)   SpO2 97%   BMI 36.03 kg/m   History of Present Illness: Sandra [name] is a 42 y.o. female with cough and hilar adenopathy.  She has noticed a problem with a cough for the past 1 year.  This has been getting worse.  She will wake up from sleep with a cough.  She doesn't bring up sputum.  She has also noticed feeling more short of breath going up stairs.  She has never been told she has asthma.  No history of pneumonia or TB.  She denies animal/bird exposures.  Her boyfriend smokes cigarettes.  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  She is from [place].  No sick exposures or travel history.  She was told she gets eczema in her antecubital areas and around her neck.  She also gets a pain, warm bump on her left lower leg intermittently that will resolve spontaneously.  This has occurred for about the same duration as her cough and dyspnea.  She had a chest xray which showed hilar LAN. She was started on prednisone and this has helped with her symptoms some.   Physical Exam:  General - pleasant Eyes - pupils reactive ENT - no sinus tenderness, no oral exudate, no LAN, mild erythema posterior pharynx Cardiac - regular, no murmur Chest - no wheeze, rales Abd - soft, non tender Ext - no edema Skin - no rashes Neuro - normal strength Psych - normal mood  CMP Latest Ref Rng & Units 09/01/2017  Glucose 65 - 99 mg/dL 86  BUN 6 - 24 mg/dL 10  Creatinine 0.57 - 1.00 mg/dL 0.83  Sodium 134 - 144 mmol/L 139  Potassium 3.5 - 5.2 mmol/L 4.2  Chloride 96 - 106 mmol/L 101    CO2 20 - 29 mmol/L 23  Calcium 8.7 - 10.2 mg/dL 9.1  Total Protein 6.0 - 8.5 g/dL 7.4  Total Bilirubin 0.0 - 1.2 mg/dL 0.5  Alkaline Phos 39 - 117 IU/L 73  AST 0 - 40 IU/L 31  ALT 0 - 32 IU/L 32    CBC Latest Ref Rng & Units 09/01/2017 06/28/2017 01/03/2015  WBC 3.4 - 10.8 x10E3/uL 5.1 5.0 4.6  Hemoglobin 11.1 - 15.9 g/dL 12.5 11.9(L) 10.4(L)  Hematocrit 34.0 - 46.6 % 38.0 36.9 31.4(L)  Platelets 150 - 379 x10E3/uL 300 227 222    Lab Results  Component Value Date   ESRSEDRATE 13 09/01/2017    Dg Chest 2 View  Result Date: 08/25/2017 CLINICAL DATA:  Abnormal radiographs at a chiropractor office. EXAM: CHEST  2 VIEW COMPARISON:  None. FINDINGS: The patient has bilateral hilar adenopathy. Heart size and vascularity are normal. Lungs are clear. Bones are normal. IMPRESSION: Bilateral hilar adenopathy. The differential diagnosis includes sarcoidosis, lymphoma, and other malignancies. Electronically Signed   By: Lorriane Shire M.D.   On: 08/25/2017 17:26    Discussion: She has dry cough, progressive dyspnea, and skin lesion on leg that sounds like erythema nodosum.  Her CXR  shows b/l hilar adenopathy.  I am most concerned that she could have sarcoidosis.  Assessment/Plan:  Hilar adenopathy. - okay to continue course of prednisone for now - will arrange for CT chest with contrast and PFTs - will then determine is she needs to be scheduled for bronchoscopy   Patient Instructions  Will arrange for CT chest with contrast and pulmonary function testing  Follow up in 2 weeks with Dr. Halford Chessman or Nurse Practitioner    Chesley Mires, MD [place] 09/08/2017, 2:58 PM Pager:  [phone]  Flow Sheet  Pulmonary tests:  Review of Systems: Constitutional: Positive for appetite change and unexpected weight change. Negative for fever.  HENT: Positive for sinus pressure, sneezing and sore throat. Negative for congestion, dental problem, ear pain, nosebleeds, postnasal  drip, rhinorrhea and trouble swallowing.   Eyes: Negative for redness and itching.  Respiratory: Positive for cough, choking, chest tightness, shortness of breath and wheezing.   Cardiovascular: Negative for palpitations and leg swelling.  Gastrointestinal: Negative for nausea and vomiting.  Genitourinary: Negative for dysuria.  Musculoskeletal: Negative for joint swelling.  Skin: Positive for rash.  Allergic/Immunologic: Negative.  Negative for environmental allergies, food allergies and immunocompromised state.  Neurological: Positive for headaches.  Hematological: Does not bruise/bleed easily.  Psychiatric/Behavioral: Negative for dysphoric mood. The patient is not nervous/anxious.    Past Medical History: She  has a past medical history of Complication of anesthesia, GERD (gastroesophageal reflux disease), OAB (overactive bladder), and PONV (postoperative nausea and vomiting).  Past Surgical History: She  has a past surgical history that includes Cesarean section; Breast surgery; Breast excisional biopsy (Right); Breast excisional biopsy (Left); Hernia repair; Wisdom tooth extraction; Esophagogastroduodenoscopy (05/2017); and Cholecystectomy (N/A, 06/28/2017).  Family History: Her family history includes Asthma in her mother; Cancer in her maternal grandfather, maternal grandmother, and paternal grandmother; Diabetes in her mother; Heart murmur in her mother; Kidney disease in her paternal grandmother; Sleep apnea in her mother.  Social History: She  reports that  has never smoked. she has never used smokeless tobacco. She reports that she drinks about 2.4 oz of alcohol per week. She reports that she does not use drugs.  Medications: Allergies as of 09/08/2017      Reactions   Penicillins Rash   Has patient had a PCN reaction causing immediate rash, facial/tongue/throat swelling, SOB or lightheadedness with hypotension: yes Has patient had a PCN reaction causing severe rash involving  mucus membranes or skin necrosis: no Has patient had a PCN reaction that required hospitalization: no Has patient had a PCN reaction occurring within the last 10 years: no If all of the above answers are "NO", then may proceed with Cephalosporin use.   Influenza Vaccines    illness      Medication List        Accurate as of 09/08/17  2:58 PM. Always use your most recent med list.          benzonatate 200 MG capsule Commonly known as:  TESSALON Take 1 capsule (200 mg total) by mouth 3 (three) times daily as needed for cough.   dexlansoprazole 60 MG capsule Commonly known as:  DEXILANT Take 1 capsule (60 mg total) by mouth 2 (two) times daily.   levonorgestrel 20 MCG/24HR IUD Commonly known as:  MIRENA 1 each by Intrauterine route once.   predniSONE 10 MG tablet Commonly known as:  DELTASONE 6/5/4/3/2/1 taper   solifenacin 10 MG tablet Commonly known as:  VESICARE Take 10 mg by mouth  daily.

## 2017-09-08 NOTE — Patient Instructions (Signed)
Will arrange for CT chest with contrast and pulmonary function testing  Follow up in 2 weeks with Dr. Halford Chessman or Nurse Practitioner

## 2017-09-13 ENCOUNTER — Ambulatory Visit (INDEPENDENT_AMBULATORY_CARE_PROVIDER_SITE_OTHER)
Admission: RE | Admit: 2017-09-13 | Discharge: 2017-09-13 | Disposition: A | Discharge status: Home / Self Care | Payer: 59 | Source: Ambulatory Visit | Attending: Pulmonary Disease | Admitting: Pulmonary Disease

## 2017-09-13 DIAGNOSIS — R590 Localized enlarged lymph nodes: Secondary | ICD-10-CM | POA: Diagnosis not present

## 2017-09-13 DIAGNOSIS — R0602 Shortness of breath: Secondary | ICD-10-CM | POA: Diagnosis not present

## 2017-09-13 MED ORDER — IOPAMIDOL (ISOVUE-300) INJECTION 61%
80.0000 mL | Freq: Once | INTRAVENOUS | Status: AC | PRN
  Administered 2017-09-13: 80 mL via INTRAVENOUS

## 2017-09-15 ENCOUNTER — Telehealth: Payer: Self-pay | Admitting: Pulmonary Disease

## 2017-09-15 MED ORDER — PREDNISONE 10 MG PO TABS: 10.0000 mg | ORAL_TABLET | Freq: Every day | ORAL | 1 refills | Status: DC

## 2017-09-15 NOTE — Telephone Encounter (Signed)
Called and spoke with patient, advised that we will get the results and contact her once we have them.  VS please advise on patients CT results.

## 2017-09-15 NOTE — Telephone Encounter (Signed)
CT chest 09/13/17 >> bulky mediastinal and hilar LAN, 10 mm nodule Rt perihilar region, 6 mm nodule Rt major fissure   Results d/w pt.  She was feeling better while on prednisone, but her previous prescription ran out.  I explained that clinically and radiographically this looks like sarcoidosis.  Gave her options of 1) working under presumptive dx of sarcoid and restarting prednisone with slower taper, or 2) proceed with bronchoscopy.  She is comfortable with restarting prednisone and following up in office and then repeat CT chest later.

## 2017-09-23 ENCOUNTER — Ambulatory Visit (INDEPENDENT_AMBULATORY_CARE_PROVIDER_SITE_OTHER): Payer: 59 | Admitting: Pulmonary Disease

## 2017-09-23 ENCOUNTER — Encounter: Payer: Self-pay | Admitting: Acute Care

## 2017-09-23 ENCOUNTER — Ambulatory Visit (INDEPENDENT_AMBULATORY_CARE_PROVIDER_SITE_OTHER): Payer: 59 | Admitting: Acute Care

## 2017-09-23 ENCOUNTER — Telehealth: Payer: Self-pay

## 2017-09-23 VITALS — BP 136/90 | HR 83 | Temp 98.2°F | Ht 62.5 in | Wt 199.8 lb

## 2017-09-23 DIAGNOSIS — R058 Other specified cough: Secondary | ICD-10-CM

## 2017-09-23 DIAGNOSIS — R05 Cough: Secondary | ICD-10-CM

## 2017-09-23 DIAGNOSIS — R590 Localized enlarged lymph nodes: Secondary | ICD-10-CM

## 2017-09-23 DIAGNOSIS — R059 Cough, unspecified: Secondary | ICD-10-CM | POA: Insufficient documentation

## 2017-09-23 DIAGNOSIS — D869 Sarcoidosis, unspecified: Secondary | ICD-10-CM

## 2017-09-23 LAB — PULMONARY FUNCTION TEST
DL/VA % pred: 133 %
DL/VA: 6.13 ml/min/mmHg/L
DLCO cor % pred: 76 %
DLCO cor: 17.01 ml/min/mmHg
DLCO unc % pred: 74 %
DLCO unc: 16.52 ml/min/mmHg
FEF 25-75 Post: 2.01 L/sec
FEF 25-75 Pre: 2.7 L/sec
FEF2575-%Change-Post: -25 %
FEF2575-%Pred-Post: 75 %
FEF2575-%Pred-Pre: 101 %
FEV1-%Change-Post: 0 %
FEV1-%Pred-Post: 81 %
FEV1-%Pred-Pre: 81 %
FEV1-Post: 1.93 L
FEV1-Pre: 1.94 L
FEV1FVC-%Change-Post: -4 %
FEV1FVC-%Pred-Pre: 106 %
FEV6-%Change-Post: 3 %
FEV6-%Pred-Post: 80 %
FEV6-%Pred-Pre: 77 %
FEV6-Post: 2.27 L
FEV6-Pre: 2.19 L
FEV6FVC-%Pred-Post: 102 %
FEV6FVC-%Pred-Pre: 102 %
FVC-%Change-Post: 3 %
FVC-%Pred-Post: 78 %
FVC-%Pred-Pre: 75 %
FVC-Post: 2.27 L
FVC-Pre: 2.19 L
Post FEV1/FVC ratio: 85 %
Post FEV6/FVC ratio: 100 %
Pre FEV1/FVC ratio: 89 %
Pre FEV6/FVC Ratio: 100 %
RV % pred: 80 %
RV: 1.23 L
TLC % pred: 76 %
TLC: 3.69 L

## 2017-09-23 LAB — NITRIC OXIDE: Nitric Oxide: 31

## 2017-09-23 MED ORDER — HYDROCODONE-HOMATROPINE 5-1.5 MG/5ML PO SYRP: 5.0000 mL | ORAL_SOLUTION | Freq: Four times a day (QID) | ORAL | 0 refills | Status: DC | PRN

## 2017-09-23 MED ORDER — PREDNISONE 10 MG PO TABS: ORAL_TABLET | ORAL | 0 refills | Status: DC

## 2017-09-23 MED ORDER — HYDROCODONE-HOMATROPINE 5-1.5 MG/5ML PO SYRP: 5.0000 mL | ORAL_SOLUTION | Freq: Every day | ORAL | 0 refills | Status: DC

## 2017-09-23 NOTE — Telephone Encounter (Signed)
RB-SG would like to schedule a bronch with you. Can you see when would be the best time for you. Thanks

## 2017-09-23 NOTE — Progress Notes (Signed)
PFT completed today 09/23/17   Unable to obtain DLCO due to patient being sick.

## 2017-09-23 NOTE — Assessment & Plan Note (Addendum)
Suspected sarcoid.Plan was to tx with prednisone and wait on bronch Initially prednisone was helping, but now no improvement on prednisone Cough has worsened Compliant with GERD regimen FENO 31 ppb Upper airway wheezing Plan: FENO today Prednisone taper; 10 mg tablets: 4 tabs x 2 days, 3 tabs x 2 days, 2 tabs x 2 days , 1 tab  and remain on this dose until seen again. We will schedule you for bronchoscopy for biopsy for definitive diagnosis of sarcoid. Ayr saline gel at night for nasal dryness Ayr saline mist during the day for nasal dryness. Hydromet cough syrup 5 cc's at bedtime Do not drive if sleepy. Sips of water instead of throat clearing. Throat soothing with sugar free jolly ranchers or sugar free werthers Avoid mint,menthol, chocolate, caffeine Delsym cough syrup every 12 hours during the day. Follow up with Dr. Halford Chessman after bronch. Please contact office for sooner follow up if symptoms do not improve or worsen or seek emergency care

## 2017-09-23 NOTE — Patient Instructions (Addendum)
It is nice to meet you today. FENO today Prednisone taper; 10 mg tablets: 4 tabs x 2 days, 3 tabs x 2 days, 2 tabs x 2 days , 1 tab  and remain on this dose until seen again. We will schedule you for bronchoscopy for biopsy for definitive diagnosis of sarcoid. Ayr saline gel at night for nasal dryness Ayr saline mist during the day for nasal dryness. Hydromet cough syrup 5 cc's at bedtime Do not drive if sleepy. Sips of water instead of throat clearing. Throat soothing with sugar free jolly ranchers or sugar free werthers Avoid mint,menthol, chocolate, caffeine Delsym cough syrup every 12 hours during the day. Follow up with Dr. Halford Chessman after bronch. Please contact office for sooner follow up if symptoms do not improve or worsen or seek emergency care

## 2017-09-23 NOTE — Telephone Encounter (Signed)
Looks like she needs EBUS. I could arrange for Monday am 09/27/17 at [facility]

## 2017-09-23 NOTE — Progress Notes (Signed)
History of Present Illness Sandra [name] is a 42 y.o. female with    09/23/2017  Last seen by Dr. Halford Chessman 09/08/2017 for hilar adenopathy. Plan after that visit was as noted below: Discussion: She has dry cough, progressive dyspnea, and skin lesion on leg that sounds like erythema nodosum.  Her CXR shows b/l hilar adenopathy.  I am most concerned that she could have sarcoidosis.  Assessment/Plan:  Hilar adenopathy. - okay to continue course of prednisone for now - will arrange for CT chest with contrast and PFTs - will then determine is she needs to be scheduled for bronchoscopy   Patient Instructions  Will arrange for CT chest with contrast and pulmonary function testing  CT chest 09/13/17 >> bulky mediastinal and hilar LAN, 10 mm nodule Rt perihilar region, 6 mm nodule Rt major fissure Dr. Halford Chessman discussed Results with  pt.  She was feeling better while on prednisone, but her previous prescription ran out.  He  explained that clinically and radiographically this looks like sarcoidosis.  He Gave her options of 1) working under presumptive dx of sarcoid and restarting prednisone with slower taper, or 2) proceed with bronchoscopy.  She is comfortable with restarting prednisone and following up in office and then repeat CT chest later.  Pt. Presents for follow up: She is currently on prednisone at 10 mg daily with breakfast. She states she cannot tell any difference with her cough or her dyspnea. She is taking Tessalon for cough. It helped at first, but now it is not helping.She states she has a head cold with stuffy dry nose with non-productive cough.Chest pain only with cough and shortness of breath. She states that the albuterol did open her up after PFT's done today. BP is elevated.She states she has clear secretions. She has yellowish nasal secretions, no bloody secretions.. She has nasal sinus tenderness and frontal sinus tenderness. She has upper airway wheezing today.She is  compliant with her GERD RX.She denies fever, orthopnea or hemoptysis.  Test Results: 09/23/2017>> 31 ppb CT chest 09/13/17 >> bulky mediastinal and hilar LAN, 10 mm nodule Rt perihilar region, 6 mm nodule Rt major fissure PFT's 09/23/2017>> While sick with cough>> will need to be repeated FVC 2.19 or 75%, FEV1 1.94 or 81%, F/F ratio 89 ( 106%), TLC 3.69 ( 76%), DLCO 74%, No BD response    CBC Latest Ref Rng & Units 09/01/2017 06/28/2017 01/03/2015  WBC 3.4 - 10.8 x10E3/uL 5.1 5.0 4.6  Hemoglobin 11.1 - 15.9 g/dL 12.5 11.9(L) 10.4(L)  Hematocrit 34.0 - 46.6 % 38.0 36.9 31.4(L)  Platelets 150 - 379 x10E3/uL 300 227 222    BMP Latest Ref Rng & Units 09/01/2017  Glucose 65 - 99 mg/dL 86  BUN 6 - 24 mg/dL 10  Creatinine 0.57 - 1.00 mg/dL 0.83  BUN/Creat Ratio 9 - 23 12  Sodium 134 - 144 mmol/L 139  Potassium 3.5 - 5.2 mmol/L 4.2  Chloride 96 - 106 mmol/L 101  CO2 20 - 29 mmol/L 23  Calcium 8.7 - 10.2 mg/dL 9.1    BNP No results found for: BNP  ProBNP No results found for: PROBNP  PFT    Component Value Date/Time   FEV1PRE 1.94 09/23/2017 0835   FEV1POST 1.93 09/23/2017 0835   FVCPRE 2.19 09/23/2017 0835   FVCPOST 2.27 09/23/2017 0835   TLC 3.69 09/23/2017 0835   DLCOUNC 16.52 09/23/2017 0835   PREFEV1FVCRT 89 09/23/2017 0835   PSTFEV1FVCRT 85 09/23/2017 0835    Dg Chest 2  View  Result Date: 08/25/2017 CLINICAL DATA:  Abnormal radiographs at a chiropractor office. EXAM: CHEST  2 VIEW COMPARISON:  None. FINDINGS: The patient has bilateral hilar adenopathy. Heart size and vascularity are normal. Lungs are clear. Bones are normal. IMPRESSION: Bilateral hilar adenopathy. The differential diagnosis includes sarcoidosis, lymphoma, and other malignancies. Electronically Signed   By: Lorriane Shire M.D.   On: 08/25/2017 17:26   Ct Chest W Contrast  Result Date: 09/13/2017 CLINICAL DATA:  Chronic cough in shortness of breath. Hilar adenopathy on chest radiograph. EXAM: CT CHEST WITH  CONTRAST TECHNIQUE: Multidetector CT imaging of the chest was performed during intravenous contrast administration. CONTRAST:  16mL ISOVUE-300 IOPAMIDOL (ISOVUE-300) INJECTION 61% COMPARISON:  Chest radiograph 08/25/2017. FINDINGS: Cardiovascular: Vascular structures are unremarkable. Heart is at the upper limits of normal in size to mildly enlarged. No pericardial effusion. Mediastinum/Nodes: Bulky mediastinal and hilar adenopathy. Index prevascular lymph node measures 13 mm. Index right hilar lymph node, 1.8 cm. Subcarinal lymph node, 1.6 cm. No axillary adenopathy. Esophagus is grossly unremarkable. Lungs/Pleura: 7 x 10 mm perifissural nodule is seen in the right perihilar region, likely an intrapulmonary lymph node. 6 mm nodule along the right major fissure (image 46), also likely a subpleural lymph node. Lungs are otherwise clear. No pleural fluid. Airway is unremarkable. Upper Abdomen: Visualized portions of the liver, adrenal glands, kidneys, spleen, pancreas and stomach are grossly unremarkable. Musculoskeletal: No worrisome lytic or sclerotic lesions. IMPRESSION: Bulky mediastinal and bi hilar adenopathy, suspicious for sarcoid. Lymphoma and metastatic disease are not excluded. Electronically Signed   By: Lorin Picket M.D.   On: 09/13/2017 10:54     Past medical hx Past Medical History:  Diagnosis Date  . Complication of anesthesia   . GERD (gastroesophageal reflux disease)   . OAB (overactive bladder)   . PONV (postoperative nausea and vomiting)    "patch works well."     Social History   Tobacco Use  . Smoking status: Never Smoker  . Smokeless tobacco: Never Used  Substance Use Topics  . Alcohol use: Yes    Alcohol/week: 2.4 oz    Types: 2 Shots of liquor, 2 Glasses of wine per week    Frequency: Never    Comment: ocassional  . Drug use: No    Ms.[name] reports that  has never smoked. she has never used smokeless tobacco. She reports that she drinks about 2.4 oz of alcohol  per week. She reports that she does not use drugs.  Tobacco Cessation: Never smoker  Past surgical hx, Family hx, Social hx all reviewed.  Current Outpatient Medications on File Prior to Visit  Medication Sig  . benzonatate (TESSALON) 200 MG capsule Take 1 capsule (200 mg total) by mouth 3 (three) times daily as needed for cough.  . dexlansoprazole (DEXILANT) 60 MG capsule Take 1 capsule (60 mg total) by mouth 2 (two) times daily.  Marland Kitchen levonorgestrel (MIRENA) 20 MCG/24HR IUD 1 each by Intrauterine route once.  . predniSONE (DELTASONE) 10 MG tablet Take 1 tablet (10 mg total) by mouth daily with breakfast.  . solifenacin (VESICARE) 10 MG tablet Take 10 mg by mouth daily.   No current facility-administered medications on file prior to visit.      Allergies  Allergen Reactions  . Penicillins Rash    Has patient had a PCN reaction causing immediate rash, facial/tongue/throat swelling, SOB or lightheadedness with hypotension: yes Has patient had a PCN reaction causing severe rash involving mucus membranes or skin necrosis: no Has  patient had a PCN reaction that required hospitalization: no Has patient had a PCN reaction occurring within the last 10 years: no If all of the above answers are "NO", then may proceed with Cephalosporin use.   . Influenza Vaccines     illness    Review Of Systems:  Constitutional:   No  weight loss, night sweats,  Fevers, chills, fatigue, or  lassitude.  HEENT:   No headaches,  Difficulty swallowing,  Tooth/dental problems, or  Sore throat,                No sneezing, itching, ear ache, nasal congestion, post nasal drip, dry sinuses  CV:  + chest pain with cough only ,  Orthopnea, PND, swelling in lower extremities, anasarca, dizziness, palpitations, syncope.   GI  No heartburn, indigestion, abdominal pain, nausea, vomiting, diarrhea, change in bowel habits, loss of appetite, bloody stools.   Resp: + shortness of breath with exertion less  at rest.  No  excess mucus, + productive cough,  + non-productive cough,  No coughing up of blood.  No change in color of mucus.  + wheezing.  No chest wall deformity  Skin: no rash or lesions.  GU: no dysuria, change in color of urine, no urgency or frequency.  No flank pain, no hematuria   MS:  No joint pain or swelling.  No decreased range of motion.  No back pain.  Psych:  No change in mood or affect. No depression or anxiety.  No memory loss.   Vital Signs BP 136/90 (BP Location: Left Arm, Cuff Size: Normal)   Pulse 83   Temp 98.2 F (36.8 C) (Oral)   Ht 5' 2.5" (1.588 m)   Wt 199 lb 12.8 oz (90.6 kg)   LMP  (LMP Unknown)   SpO2 99%   BMI 35.96 kg/m    Physical Exam:  General- No distress,  A&Ox3, hoarse with cough ENT: No sinus tenderness, TM clear, pale nasal mucosa, no oral exudate,no post nasal drip, no LAN, sinus dryness Cardiac: S1, S2, regular rate and rhythm, no murmur Chest: + upper airway  wheeze/ No rales/ dullness; no accessory muscle use, no nasal flaring, no sternal retractions Abd.: Soft Non-tender, non-distended Ext: No clubbing cyanosis, edema Neuro:  normal strength, MAE x 4, A&O x 3 Skin: No rashes, warm and dry Psych: normal mood and behavior   Assessment/Plan  Cough Suspected sarcoid.Plan was to tx with prednisone and wait on bronch Initially prednisone was helping, but now no improvement on prednisone Cough has worsened Compliant with GERD regimen FENO 31 ppb Upper airway wheezing Plan: FENO today Prednisone taper; 10 mg tablets: 4 tabs x 2 days, 3 tabs x 2 days, 2 tabs x 2 days , 1 tab  and remain on this dose until seen again. We will schedule you for bronchoscopy for biopsy for definitive diagnosis of sarcoid. Ayr saline gel at night for nasal dryness Ayr saline mist during the day for nasal dryness. Hydromet cough syrup 5 cc's at bedtime Do not drive if sleepy. Sips of water instead of throat clearing. Throat soothing with sugar free jolly  ranchers or sugar free werthers Avoid mint,menthol, chocolate, caffeine Delsym cough syrup every 12 hours during the day. Follow up with Dr. Halford Chessman after bronch. Please contact office for sooner follow up if symptoms do not improve or worsen or seek emergency care      Magdalen Spatz, NP 09/23/2017  11:07 AM

## 2017-09-24 NOTE — Telephone Encounter (Signed)
Order has been placed per RB. 

## 2017-09-27 ENCOUNTER — Other Ambulatory Visit: Payer: Self-pay

## 2017-09-27 ENCOUNTER — Encounter (HOSPITAL_COMMUNITY): Payer: Self-pay | Admitting: *Deleted

## 2017-09-27 NOTE — Progress Notes (Signed)
Spoke with pt for pre-op call. Pt denies any cardiac history, HTN or diabetes.

## 2017-09-28 ENCOUNTER — Ambulatory Visit (HOSPITAL_COMMUNITY): Payer: 59 | Admitting: Certified Registered Nurse Anesthetist

## 2017-09-28 ENCOUNTER — Ambulatory Visit (HOSPITAL_COMMUNITY)
Admission: RE | Admit: 2017-09-28 | Discharge: 2017-09-28 | Disposition: A | Discharge status: Home / Self Care | Payer: 59 | Source: Ambulatory Visit | Attending: Emergency Medicine | Admitting: Emergency Medicine

## 2017-09-28 ENCOUNTER — Encounter (HOSPITAL_COMMUNITY)
Admission: RE | Disposition: A | Discharge status: Home / Self Care | Payer: Self-pay | Source: Ambulatory Visit | Attending: Emergency Medicine

## 2017-09-28 ENCOUNTER — Encounter (HOSPITAL_COMMUNITY): Payer: Self-pay

## 2017-09-28 DIAGNOSIS — Z880 Allergy status to penicillin: Secondary | ICD-10-CM | POA: Insufficient documentation

## 2017-09-28 DIAGNOSIS — N946 Dysmenorrhea, unspecified: Secondary | ICD-10-CM | POA: Diagnosis not present

## 2017-09-28 DIAGNOSIS — Z825 Family history of asthma and other chronic lower respiratory diseases: Secondary | ICD-10-CM | POA: Insufficient documentation

## 2017-09-28 DIAGNOSIS — R590 Localized enlarged lymph nodes: Secondary | ICD-10-CM | POA: Diagnosis not present

## 2017-09-28 DIAGNOSIS — K219 Gastro-esophageal reflux disease without esophagitis: Secondary | ICD-10-CM | POA: Insufficient documentation

## 2017-09-28 DIAGNOSIS — N3281 Overactive bladder: Secondary | ICD-10-CM | POA: Insufficient documentation

## 2017-09-28 DIAGNOSIS — R0789 Other chest pain: Secondary | ICD-10-CM | POA: Insufficient documentation

## 2017-09-28 DIAGNOSIS — R591 Generalized enlarged lymph nodes: Secondary | ICD-10-CM | POA: Diagnosis not present

## 2017-09-28 DIAGNOSIS — R918 Other nonspecific abnormal finding of lung field: Secondary | ICD-10-CM | POA: Diagnosis not present

## 2017-09-28 HISTORY — PX: VIDEO BRONCHOSCOPY WITH ENDOBRONCHIAL ULTRASOUND: SHX6177

## 2017-09-28 HISTORY — DX: Headache, unspecified: R51.9

## 2017-09-28 HISTORY — DX: Anemia, unspecified: D64.9

## 2017-09-28 HISTORY — DX: Headache: R51

## 2017-09-28 HISTORY — DX: Unspecified osteoarthritis, unspecified site: M19.90

## 2017-09-28 LAB — POCT PREGNANCY, URINE: Preg Test, Ur: NEGATIVE

## 2017-09-28 LAB — CBC
HCT: 37.9 % (ref 36.0–46.0)
Hemoglobin: 12.2 g/dL (ref 12.0–15.0)
MCH: 29 pg (ref 26.0–34.0)
MCHC: 32.2 g/dL (ref 30.0–36.0)
MCV: 90.2 fL (ref 78.0–100.0)
Platelets: 252 10*3/uL (ref 150–400)
RBC: 4.2 MIL/uL (ref 3.87–5.11)
RDW: 14.2 % (ref 11.5–15.5)
WBC: 4.4 10*3/uL (ref 4.0–10.5)

## 2017-09-28 SURGERY — BRONCHOSCOPY, WITH EBUS
Anesthesia: General | Site: Bronchus

## 2017-09-28 MED ORDER — ROCURONIUM BROMIDE 10 MG/ML (PF) SYRINGE
PREFILLED_SYRINGE | INTRAVENOUS | Status: DC | PRN
  Administered 2017-09-28: 50 mg via INTRAVENOUS

## 2017-09-28 MED ORDER — SUGAMMADEX SODIUM 200 MG/2ML IV SOLN
INTRAVENOUS | Status: DC | PRN
  Administered 2017-09-28: 200 mg via INTRAVENOUS

## 2017-09-28 MED ORDER — LIDOCAINE 2% (20 MG/ML) 5 ML SYRINGE
INTRAMUSCULAR | Status: DC | PRN
  Administered 2017-09-28: 100 mg via INTRAVENOUS

## 2017-09-28 MED ORDER — MIDAZOLAM HCL 2 MG/2ML IJ SOLN
INTRAMUSCULAR | Status: DC | PRN
  Administered 2017-09-28: 2 mg via INTRAVENOUS

## 2017-09-28 MED ORDER — HYDROMORPHONE HCL 1 MG/ML IJ SOLN: 0.2500 mg | INTRAMUSCULAR | Status: DC | PRN

## 2017-09-28 MED ORDER — MEPERIDINE HCL 50 MG/ML IJ SOLN: 6.2500 mg | INTRAMUSCULAR | Status: DC | PRN

## 2017-09-28 MED ORDER — PHENYLEPHRINE HCL 10 MG/ML IJ SOLN
INTRAMUSCULAR | Status: DC | PRN
  Administered 2017-09-28 (×2): 40 ug via INTRAVENOUS

## 2017-09-28 MED ORDER — FENTANYL CITRATE (PF) 250 MCG/5ML IJ SOLN
INTRAMUSCULAR | Status: DC | PRN
  Administered 2017-09-28: 50 ug via INTRAVENOUS
  Administered 2017-09-28: 150 ug via INTRAVENOUS

## 2017-09-28 MED ORDER — SCOPOLAMINE 1 MG/3DAYS TD PT72
MEDICATED_PATCH | TRANSDERMAL | Status: DC | PRN
  Administered 2017-09-28: 1 via TRANSDERMAL

## 2017-09-28 MED ORDER — ONDANSETRON HCL 4 MG/2ML IJ SOLN: 4.0000 mg | Freq: Once | INTRAMUSCULAR | Status: DC | PRN

## 2017-09-28 MED ORDER — PROPOFOL 10 MG/ML IV BOLUS
INTRAVENOUS | Status: DC | PRN
  Administered 2017-09-28: 100 mg via INTRAVENOUS

## 2017-09-28 MED ORDER — 0.9 % SODIUM CHLORIDE (POUR BTL) OPTIME
TOPICAL | Status: DC | PRN
  Administered 2017-09-28: 1000 mL

## 2017-09-28 MED ORDER — ONDANSETRON HCL 4 MG/2ML IJ SOLN
INTRAMUSCULAR | Status: DC | PRN
  Administered 2017-09-28: 4 mg via INTRAVENOUS

## 2017-09-28 MED ORDER — DEXAMETHASONE SODIUM PHOSPHATE 10 MG/ML IJ SOLN
INTRAMUSCULAR | Status: DC | PRN
  Administered 2017-09-28: 10 mg via INTRAVENOUS

## 2017-09-28 MED ORDER — LACTATED RINGERS IV SOLN
INTRAVENOUS | Status: DC | PRN
  Administered 2017-09-28 (×2): via INTRAVENOUS

## 2017-09-28 MED ORDER — PREDNISONE 10 MG PO TABS: 10.0000 mg | ORAL_TABLET | Freq: Two times a day (BID) | ORAL | Status: DC

## 2017-09-28 MED ORDER — FENTANYL CITRATE (PF) 250 MCG/5ML IJ SOLN
INTRAMUSCULAR | Status: AC
  Filled 2017-09-28: qty 5

## 2017-09-28 MED ORDER — MIDAZOLAM HCL 2 MG/2ML IJ SOLN
INTRAMUSCULAR | Status: AC
  Filled 2017-09-28: qty 2

## 2017-09-28 MED ORDER — PROPOFOL 10 MG/ML IV BOLUS
INTRAVENOUS | Status: AC
  Filled 2017-09-28: qty 20

## 2017-09-28 SURGICAL SUPPLY — 29 items
BRUSH CYTOL CELLEBRITY 1.5X140 (MISCELLANEOUS) ×2 IMPLANT
CANISTER SUCT 3000ML PPV (MISCELLANEOUS) ×3 IMPLANT
CONT SPEC 4OZ CLIKSEAL STRL BL (MISCELLANEOUS) ×3 IMPLANT
COVER BACK TABLE 60X90IN (DRAPES) ×3 IMPLANT
COVER DOME SNAP 22 D (MISCELLANEOUS) ×3 IMPLANT
FORCEPS BIOP RJ4 1.8 (CUTTING FORCEPS) ×2 IMPLANT
GAUZE SPONGE 4X4 12PLY STRL (GAUZE/BANDAGES/DRESSINGS) ×3 IMPLANT
GLOVE BIO SURGEON STRL SZ7.5 (GLOVE) ×3 IMPLANT
GOWN STRL REUS W/ TWL LRG LVL3 (GOWN DISPOSABLE) ×1 IMPLANT
GOWN STRL REUS W/TWL LRG LVL3 (GOWN DISPOSABLE) ×3
KIT CLEAN ENDO COMPLIANCE (KITS) ×6 IMPLANT
KIT ROOM TURNOVER OR (KITS) ×3 IMPLANT
MARKER SKIN DUAL TIP RULER LAB (MISCELLANEOUS) ×3 IMPLANT
NDL EBUS SONO TIP PENTAX (NEEDLE) ×1 IMPLANT
NEEDLE EBUS SONO TIP PENTAX (NEEDLE) ×3 IMPLANT
NS IRRIG 1000ML POUR BTL (IV SOLUTION) ×3 IMPLANT
OIL SILICONE PENTAX (PARTS (SERVICE/REPAIRS)) ×3 IMPLANT
PAD ARMBOARD 7.5X6 YLW CONV (MISCELLANEOUS) ×6 IMPLANT
SYR 20CC LL (SYRINGE) ×6 IMPLANT
SYR 20ML ECCENTRIC (SYRINGE) ×6 IMPLANT
SYR 50ML SLIP (SYRINGE) IMPLANT
SYR 5ML LUER SLIP (SYRINGE) ×3 IMPLANT
TOWEL OR 17X24 6PK STRL BLUE (TOWEL DISPOSABLE) ×3 IMPLANT
TRAP SPECIMEN MUCOUS 40CC (MISCELLANEOUS) IMPLANT
TUBE CONNECTING 20'X1/4 (TUBING) ×2
TUBE CONNECTING 20X1/4 (TUBING) ×4 IMPLANT
UNDERPAD 30X30 (UNDERPADS AND DIAPERS) ×3 IMPLANT
VALVE DISPOSABLE (MISCELLANEOUS) ×5 IMPLANT
WATER STERILE IRR 1000ML POUR (IV SOLUTION) ×3 IMPLANT

## 2017-09-28 NOTE — Discharge Instructions (Signed)
Flexible Bronchoscopy, Care After These instructions give you information on caring for yourself after your procedure. Your doctor may also give you more specific instructions. Call your doctor if you have any problems or questions after your procedure. Follow these instructions at home:  Do not eat or drink anything for 2 hours after your procedure. If you try to eat or drink before the medicine wears off, food or drink could go into your lungs. You could also burn yourself.  After 2 hours have passed and when you can cough and gag normally, you may eat soft food and drink liquids slowly.  The day after the test, you may eat your normal diet.  You may do your normal activities.  Keep all doctor visits. Get help right away if:  You get more and more short of breath.  You get light-headed.  You feel like you are going to pass out (faint).  You have chest pain.  You have new problems that worry you.  You cough up more than a little blood.  You cough up more blood than before.  PLEASE CALL OUR OFFICE FOR ANY QUESTIONS OR CONCERNS [phone].    This information is not intended to replace advice given to you by your health care provider. Make sure you discuss any questions you have with your health care provider. Document Released: 05/10/2009 Document Revised: 12/19/2015 Document Reviewed: 03/17/2013 Elsevier Interactive Patient Education  2017 [employer].

## 2017-09-28 NOTE — Interval H&P Note (Signed)
PCCM Interval Note  Pt presents today for further evaluation of cough and mediastinal lymphadenopathy.  She has been treated with prednisone.  CT scan of the chest reviewed 09/08/17, shows persistent mediastinal and hilar lymphadenopathy.  She continues to have cough.  She has some nodular lesions on her fingers, question erythema nodosum.  She presents today for bronchoscopy with bronchial ultrasound and biopsies.  All questions answered.  She understands the risks and the benefits and agrees to proceed.  No barriers are identified.   Baltazar Apo, MD, PhD 09/28/2017, 7:31 AM [facility] [phone] or if no answer [phone]

## 2017-09-28 NOTE — Transfer of Care (Signed)
Immediate Anesthesia Transfer of Care Note  Patient: Sandra [name]  Procedure(s) Performed: VIDEO BRONCHOSCOPY WITH ENDOBRONCHIAL ULTRASOUND (N/A Bronchus)  Patient Location: PACU  Anesthesia Type:General  Level of Consciousness: awake, alert  and oriented  Airway & Oxygen Therapy: Patient Spontanous Breathing and Patient connected to nasal cannula oxygen  Post-op Assessment: Report given to RN and Post -op Vital signs reviewed and stable  Post vital signs: Reviewed and stable  Last Vitals:  Vitals:   09/28/17 0846 09/28/17 0848  BP: (!) 143/132 (!) 143/126  Pulse: 92 81  Resp: (!) 23 15  Temp:    SpO2: 99% 100%    Last Pain:  Vitals:   09/28/17 0556  TempSrc: Oral         Complications: No apparent anesthesia complications

## 2017-09-28 NOTE — Op Note (Signed)
Video Bronchoscopy with Endobronchial Ultrasound Procedure Note  Date of Operation: 09/28/2017  Pre-op Diagnosis: Mediastinal and hilar lymphadenopathy  Post-op Diagnosis: Same  Surgeon: Baltazar Apo  Assistants: None  Anesthesia: General endotracheal anesthesia  Operation: Flexible video fiberoptic bronchoscopy with endobronchial ultrasound and biopsies.  Estimated Blood Loss: Minimal  Complications: None apparent  Indications and History: Sandra [name] is a 42 y.o. female with history of mediastinal and hilar lymphadenopathy, micronodular disease bilaterally concerning for possible sarcoidosis.  Recommendation made to achieve nodal biopsies via endobronchial ultrasound.  The risks, benefits, complications, treatment options and expected outcomes were discussed with the patient.  The possibilities of pneumothorax, pneumonia, reaction to medication, pulmonary aspiration, perforation of a viscus, bleeding, failure to diagnose a condition and creating a complication requiring transfusion or operation were discussed with the patient who freely signed the consent.    Description of Procedure: The patient was examined in the preoperative area and history and data from the preprocedure consultation were reviewed. It was deemed appropriate to proceed.  The patient was taken to OR 8, identified as Sandra [name] and the procedure verified as Flexible Video Fiberoptic Bronchoscopy.  A Time Out was held and the above information confirmed. After being taken to the operating room general anesthesia was initiated and the patient  was orally intubated. The video fiberoptic bronchoscope was introduced via the endotracheal tube and a general inspection was performed which showed normal airways throughout with the exception of some very mild hypopigmented nodularity on the posterior wall of the left lower lobe bronchus.  Endobronchial brushings and endobronchial biopsies were performed in the left lower  lobe bronchus for further evaluation.. The standard scope was then withdrawn and the endobronchial ultrasound was used to identify and characterize the peritracheal, hilar and bronchial lymph nodes. Inspection showed bulky lymphadenopathy throughout with some contiguous lymph node enlargement that extended from station 7 to station 11 R and 11 L.  Nodes were enlarged at stations 4R and 4L also. Using real-time ultrasound guidance Wang needle biopsies were take from Station 11 L, 11 R, 7 nodes and were sent for cytology. The patient tolerated the procedure well without apparent complications. There was no significant blood loss. The bronchoscope was withdrawn. Anesthesia was reversed and the patient was taken to the PACU for recovery.   Samples: 1. Wang needle biopsies from 11 L node 2. Wang needle biopsies from 11 R node 3. Wang needle biopsies from 7 node 4.  Endobronchial biopsies from the left lower lobe bronchus 5.  Endobronchial brushings from the left lower lobe bronchus  Plans:  The patient will be discharged from the PACU to home when recovered from anesthesia. We will review the cytology, pathology and microbiology results with the patient when they become available. Outpatient followup will be with Dr. Halford Chessman.    Baltazar Apo, MD, PhD 09/28/2017, 8:35 AM [facility] [phone] or if no answer [phone]

## 2017-09-28 NOTE — Anesthesia Preprocedure Evaluation (Signed)
Anesthesia Evaluation  Patient identified by MRN, date of birth, ID band Patient awake    Reviewed: Allergy & Precautions, NPO status , Patient's Chart, lab work & pertinent test results  History of Anesthesia Complications (+) PONV  Airway Mallampati: I  TM Distance: >3 FB Neck ROM: Full    Dental   Pulmonary    Pulmonary exam normal        Cardiovascular Normal cardiovascular exam     Neuro/Psych    GI/Hepatic GERD  Medicated and Controlled,  Endo/Other    Renal/GU      Musculoskeletal   Abdominal   Peds  Hematology   Anesthesia Other Findings   Reproductive/Obstetrics                             Anesthesia Physical Anesthesia Plan  ASA: II  Anesthesia Plan: General   Post-op Pain Management:    Induction: Intravenous  PONV Risk Score and Plan: 4 or greater and Midazolam, Dexamethasone and Ondansetron  Airway Management Planned: Oral ETT  Additional Equipment:   Intra-op Plan:   Post-operative Plan: Extubation in OR  Informed Consent: I have reviewed the patients History and Physical, chart, labs and discussed the procedure including the risks, benefits and alternatives for the proposed anesthesia with the patient or authorized representative who has indicated his/her understanding and acceptance.     Plan Discussed with: CRNA and Surgeon  Anesthesia Plan Comments:         Anesthesia Quick Evaluation

## 2017-09-28 NOTE — Anesthesia Postprocedure Evaluation (Signed)
Anesthesia Post Note  Patient: Sandra [name]  Procedure(s) Performed: VIDEO BRONCHOSCOPY WITH ENDOBRONCHIAL ULTRASOUND (N/A Bronchus)     Patient location during evaluation: PACU Anesthesia Type: General Level of consciousness: awake and alert Pain management: pain level controlled Vital Signs Assessment: post-procedure vital signs reviewed and stable Respiratory status: spontaneous breathing, nonlabored ventilation, respiratory function stable and patient connected to nasal cannula oxygen Cardiovascular status: blood pressure returned to baseline and stable Postop Assessment: no apparent nausea or vomiting Anesthetic complications: no    Last Vitals:  Vitals:   09/28/17 0906 09/28/17 0944  BP: 100/64 102/60  Pulse: 79 80  Resp: 14 16  Temp: 36.5 C   SpO2: 96% 98%    Last Pain:  Vitals:   09/28/17 0556  TempSrc: Oral                 Amanuel Sinkfield DAVID

## 2017-09-28 NOTE — Anesthesia Procedure Notes (Signed)
Procedure Name: Intubation Date/Time: 09/28/2017 7:43 AM Performed by: Bryson Corona, CRNA Pre-anesthesia Checklist: Patient identified, Emergency Drugs available, Suction available and Patient being monitored Patient Re-evaluated:Patient Re-evaluated prior to induction Oxygen Delivery Method: Circle System Utilized Preoxygenation: Pre-oxygenation with 100% oxygen Induction Type: IV induction Ventilation: Mask ventilation without difficulty Laryngoscope Size: Mac and 3 Grade View: Grade I Tube type: Oral Tube size: 8.5 mm Number of attempts: 1 Airway Equipment and Method: Stylet Placement Confirmation: ETT inserted through vocal cords under direct vision,  positive ETCO2 and breath sounds checked- equal and bilateral Secured at: 22 cm Tube secured with: Tape Dental Injury: Teeth and Oropharynx as per pre-operative assessment

## 2017-09-29 ENCOUNTER — Encounter (HOSPITAL_COMMUNITY): Payer: Self-pay | Admitting: Emergency Medicine

## 2017-09-30 ENCOUNTER — Telehealth: Payer: Self-pay | Admitting: Pulmonary Disease

## 2017-09-30 MED ORDER — LEVOFLOXACIN 500 MG PO TABS: 500.0000 mg | ORAL_TABLET | Freq: Every day | ORAL | 0 refills | Status: DC

## 2017-09-30 NOTE — Telephone Encounter (Signed)
Called and spoke with patient advised her that once we have the results we would call her. RB FYI patient is requesting these once they are available.

## 2017-09-30 NOTE — Addendum Note (Signed)
Addended by: Della Goo C on: 09/30/2017 12:54 PM   Modules accepted: Orders

## 2017-09-30 NOTE — Telephone Encounter (Signed)
I reviewed the results with the patient today.  The nodes show granulomas most consistent with sarcoidosis.  Staining of her endobronchial biopsies was negative for fungus or AFB.  She tells me that she is having cough productive of dark yellow sputum.  No longer having any dark blood.  I think we need to treat her for bronchitis and then she needs to follow-up to talk about what therapy will be best for presumed sarcoidosis.  Please call in Levaquin 500 mg once a day for the next 7 days.

## 2017-09-30 NOTE — Telephone Encounter (Signed)
Called patient she is aware and verbalized understanding. Nothing further needed.

## 2017-10-05 ENCOUNTER — Telehealth: Payer: Self-pay | Admitting: Emergency Medicine

## 2017-10-05 MED ORDER — PREDNISONE 10 MG PO TABS: 10.0000 mg | ORAL_TABLET | Freq: Every day | ORAL | 0 refills | Status: DC

## 2017-10-05 NOTE — Telephone Encounter (Signed)
Spoke with pt. Advised her per [facility] documentation, she was to remain on prednisone 10mg  until her follow up. Rx has been sent in to last until her appointment on 10/11/17. Nothing further was needed.

## 2017-10-11 ENCOUNTER — Encounter: Payer: Self-pay | Admitting: Emergency Medicine

## 2017-10-11 ENCOUNTER — Ambulatory Visit (INDEPENDENT_AMBULATORY_CARE_PROVIDER_SITE_OTHER): Payer: 59 | Admitting: Emergency Medicine

## 2017-10-11 VITALS — BP 100/66 | HR 95 | Wt 204.0 lb

## 2017-10-11 DIAGNOSIS — R059 Cough, unspecified: Secondary | ICD-10-CM

## 2017-10-11 DIAGNOSIS — D869 Sarcoidosis, unspecified: Secondary | ICD-10-CM

## 2017-10-11 DIAGNOSIS — K219 Gastro-esophageal reflux disease without esophagitis: Secondary | ICD-10-CM | POA: Diagnosis not present

## 2017-10-11 DIAGNOSIS — R05 Cough: Secondary | ICD-10-CM | POA: Diagnosis not present

## 2017-10-11 NOTE — Progress Notes (Signed)
Subjective:    Patient ID: Sandra [name], female    DOB: 04/04/1976, 42 y.o.   MRN: [id number]  HPI 42 year old woman, never smoker, with a history of GERD, anemia, eczema.  She has been seen by Dr. Craige Cotta for cough and hilar adenopathy.  I met her to perform endobronchial ultrasound which was done on 09/28/17.  Her cytologies showed granulomatous inflammation consistent with sarcoidosis. She is on prednisone 10mg , ran out yesterday.  She has done fairly well since her FOB / EBUS. She still has primary problem of cough. She has severe breakthrough GERD even on Dexilant bid. Her cough is persistent, worse when supine. She take zantac daily also. She has seen Dr Loreta Ave, unsure whether she has any more input to make.        Review of Systems  Past Medical History:  Diagnosis Date  . Anemia   . Arthritis    knees, hips  . Complication of anesthesia   . GERD (gastroesophageal reflux disease)   . Headache   . OAB (overactive bladder)   . PONV (postoperative nausea and vomiting)    "patch works well."     Family History  Problem Relation Age of Onset  . Asthma Mother   . Heart murmur Mother   . Diabetes Mother   . Sleep apnea Mother   . Cancer Maternal Grandmother        stomach  . Cancer Maternal Grandfather        colon  . Cancer Paternal Grandmother        breast  . Kidney disease Paternal Grandmother        dialysis  . Sarcoidosis Neg Hx   . Heart disease Neg Hx   . Stroke Neg Hx      Social History   Socioeconomic History  . Marital status: Significant Other    Spouse name: Not on file  . Number of children: Not on file  . Years of education: Not on file  . Highest education level: Not on file  Social Needs  . Financial resource strain: Not on file  . Food insecurity - worry: Not on file  . Food insecurity - inability: Not on file  . Transportation needs - medical: Not on file  . Transportation needs - non-medical: Not on file  Occupational History  . Not on  file  Tobacco Use  . Smoking status: Never Smoker  . Smokeless tobacco: Never Used  Substance and Sexual Activity  . Alcohol use: Yes    Alcohol/week: 2.4 oz    Types: 2 Glasses of wine, 2 Shots of liquor per week    Frequency: Never    Comment: ocasional  . Drug use: No  . Sexual activity: Yes    Birth control/protection: Surgical  Other Topics Concern  . Not on file  Social History Narrative   Lives with boyfriend, 42yo and 18yo daughter.  Does Orthoptist for [employer].  Exercise - 3+ times per week at the gym.  08/2017     Allergies  Allergen Reactions  . Penicillins Rash and Other (See Comments)    PATIENT HAS HAD A PCN REACTION WITH IMMEDIATE RASH, FACIAL/TONGUE/THROAT SWELLING, SOB, OR LIGHTHEADEDNESS WITH HYPOTENSION:  #  #  #  YES  #  #  #   Has patient had a PCN reaction causing severe rash involving mucus membranes or skin necrosis: no Has patient had a PCN reaction that required hospitalization: no Has patient had a PCN reaction  occurring within the last 10 years: no   . Influenza Vaccines Other (See Comments)    UNSPECIFIED ILLNESS     Outpatient Medications Prior to Visit  Medication Sig Dispense Refill  . benzonatate (TESSALON) 200 MG capsule Take 1 capsule (200 mg total) by mouth 3 (three) times daily as needed for cough. 30 capsule 0  . dexlansoprazole (DEXILANT) 60 MG capsule Take 1 capsule (60 mg total) by mouth 2 (two) times daily. 30 capsule 2  . levonorgestrel (MIRENA) 20 MCG/24HR IUD 1 each by Intrauterine route once.    . linaclotide (LINZESS) 72 MCG capsule Take 72 mcg by mouth daily before breakfast.    . predniSONE (DELTASONE) 10 MG tablet Take 1 tablet (10 mg total) by mouth daily. 5 tablet 0  . ranitidine (ZANTAC) 150 MG tablet Take 300 mg by mouth 2 (two) times daily.    . Saline (NASOGEL) GEL Place 1 spray into both nostrils daily as needed (for moisture).    . sodium chloride (OCEAN) 0.65 % SOLN nasal spray Place 2 sprays into both  nostrils as needed for congestion.    . solifenacin (VESICARE) 10 MG tablet Take 10 mg by mouth daily.    Marland Kitchen HYDROcodone-homatropine (HYDROMET) 5-1.5 MG/5ML syrup Take 5 mLs by mouth every 6 (six) hours as needed for cough. 120 mL 0  . levofloxacin (LEVAQUIN) 500 MG tablet Take 1 tablet (500 mg total) by mouth daily. 7 tablet 0   No facility-administered medications prior to visit.         Objective:   Physical Exam Vitals:   10/11/17 1532 10/11/17 1533  BP:  100/66  Pulse:  95  SpO2:  98%  Weight: 204 lb (92.5 kg)    Gen: Pleasant, overwt woman, in no distress,  normal affect  ENT: No lesions,  mouth clear,  oropharynx clear, no postnasal drip, she has some acneform nodules R face and L chin  Neck: No JVD, soft mild exp stridor  Lungs: No use of accessory muscles, distant, clear without rales or rhonchi  Cardiovascular: RRR, heart sounds normal, no murmur or gallops, no peripheral edema  Musculoskeletal: No deformities, no cyanosis or clubbing  Neuro: alert, non focal  Skin: Warm, no lesions or rash      Assessment & Plan:  Sarcoidosis Sarcoidosis based on EBUS needle biopsies.  No evidence for AFB.  She has been on prednisone that was initially tapered and then left at 10 mg daily for several weeks.  Her principal symptom is cough.  Unfortunately is difficult to determine whether the cough is actually due to her sarcoid or due to refractory severe GERD which is giving her a lot of problems.  She also has some skin changes on her face.  Difficult to discern whether this is acneform or a sarcoid rash.  If it is acne then it could be exacerbated by the prednisone.  At this point I think we should try stopping the prednisone and assessing her baseline.  Her cough may actually get better we can better treat her GERD.  At that time we can assess her for dyspnea, probably repeat her CT scan of the chest to evaluate her lymphadenopathy, and then decide whether she needs to be on a  longer course of higher dose prednisone.  If we do make her cough better than I would repeat her PFTs at some point  Cough Almost certainly due to her severe breakthrough GERD.  Consider also possible contribution of sarcoidosis.  Her  PFTs were challenging due to cough.  No definite evidence for airflow limitation.  These will likely need to be repeated at some point when her cough is improved.  Esophageal reflux She has severe GERD, persistent symptoms even on Dexilant and Zantac.  It has been worse since her cholecystectomy at the end of last year.  She has been evaluated by Dr. Loreta Ave.  Unclear to me whether is any plan to try and evaluate further, aggressively manage.  I'd like to refer her for second opinion here, see if other options are indicated including possibly even Nissen.   Levy Pupa, MD, PhD 10/11/2017, 4:04 PM [facility] [phone] or if no answer [phone]

## 2017-10-11 NOTE — Assessment & Plan Note (Addendum)
She has severe GERD, persistent symptoms even on Dexilant and Zantac.  It has been worse since her cholecystectomy at the end of last year.  She has been evaluated by Dr. Collene Mares.  Unclear to me whether is any plan to try and evaluate further, aggressively manage.  I'd like to refer her for second opinion here, see if other options are indicated including possibly even Nissen.

## 2017-10-11 NOTE — Assessment & Plan Note (Addendum)
Sarcoidosis based on EBUS needle biopsies.  No evidence for AFB.  She has been on prednisone that was initially tapered and then left at 10 mg daily for several weeks.  Her principal symptom is cough.  Unfortunately is difficult to determine whether the cough is actually due to her sarcoid or due to refractory severe GERD which is giving her a lot of problems.  She also has some skin changes on her face.  Difficult to discern whether this is acneform or a sarcoid rash.  If it is acne then it could be exacerbated by the prednisone.  At this point I think we should try stopping the prednisone and assessing her baseline.  Her cough may actually get better we can better treat her GERD.  At that time we can assess her for dyspnea, probably repeat her CT scan of the chest to evaluate her lymphadenopathy, and then decide whether she needs to be on a longer course of higher dose prednisone.  If we do make her cough better than I would repeat her PFTs at some point

## 2017-10-11 NOTE — Assessment & Plan Note (Signed)
Almost certainly due to her severe breakthrough GERD.  Consider also possible contribution of sarcoidosis.  Her PFTs were challenging due to cough.  No definite evidence for airflow limitation.  These will likely need to be repeated at some point when her cough is improved.

## 2017-10-11 NOTE — Patient Instructions (Addendum)
We will stop prednisone today. You need to call us if you have any problems off of the prednisone. Continue Dexilant twice day for now.  Continue Zantac daily We will refer you to Gastroenterology at [facility] for a second opinion regarding your persistent reflux on appropriate medications.  We may decide to repeat your breathing testing at some point in the future when your cough is better controlled.  Follow with Dr Halford Chessman in 1 month. At that time we will need to decide whether you need a repeat CT chest, whether you need to restart prednisone.

## 2017-10-30 ENCOUNTER — Other Ambulatory Visit: Payer: Self-pay | Admitting: Medical

## 2017-11-01 NOTE — Telephone Encounter (Signed)
Seen /10/11/17 for cough still.okay to refill?

## 2017-11-10 ENCOUNTER — Telehealth: Payer: Self-pay | Admitting: Pulmonary Disease

## 2017-11-10 NOTE — Telephone Encounter (Signed)
Called patient, unable to reach left message for patient to give [place] a call back.

## 2017-11-10 NOTE — Telephone Encounter (Signed)
Spoke with pt. States that she is not feeling well. Reports being fatigued and having body aches. Denies any pulmonary issues >> SOB, chest tightness, wheezing or coughing. Advised pt that since she is not having any issues with pulmonary problems she should contact her PCP. Pt hung up on me before I could finish the conversation with her. I attempted to contact pt back and sent the call straight to voicemail. Will close message at this time.

## 2017-11-10 NOTE — Telephone Encounter (Signed)
Patient returned call, [phone].

## 2017-11-23 ENCOUNTER — Ambulatory Visit (INDEPENDENT_AMBULATORY_CARE_PROVIDER_SITE_OTHER): Payer: 59 | Admitting: Pulmonary Disease

## 2017-11-23 ENCOUNTER — Other Ambulatory Visit (INDEPENDENT_AMBULATORY_CARE_PROVIDER_SITE_OTHER): Payer: 59

## 2017-11-23 ENCOUNTER — Encounter: Payer: Self-pay | Admitting: Pulmonary Disease

## 2017-11-23 DIAGNOSIS — D869 Sarcoidosis, unspecified: Secondary | ICD-10-CM

## 2017-11-23 LAB — COMPREHENSIVE METABOLIC PANEL
ALT: 10 U/L (ref 0–35)
AST: 15 U/L (ref 0–37)
Albumin: 4 g/dL (ref 3.5–5.2)
Alkaline Phosphatase: 66 U/L (ref 39–117)
BUN: 11 mg/dL (ref 6–23)
CO2: 29 mEq/L (ref 19–32)
Calcium: 9.1 mg/dL (ref 8.4–10.5)
Chloride: 102 mEq/L (ref 96–112)
Creatinine, Ser: 0.83 mg/dL (ref 0.40–1.20)
GFR: 97.15 mL/min (ref >60.00)
Glucose, Bld: 97 mg/dL (ref 70–99)
Potassium: 4.5 mEq/L (ref 3.5–5.1)
Sodium: 136 mEq/L (ref 135–145)
Total Bilirubin: 0.4 mg/dL (ref 0.2–1.2)
Total Protein: 7.4 g/dL (ref 6.0–8.3)

## 2017-11-23 LAB — CBC WITH DIFFERENTIAL/PLATELET
Basophils Absolute: 0 10*3/uL (ref 0.0–0.1)
Basophils Relative: 0.9 % (ref 0.0–3.0)
Eosinophils Absolute: 0.3 10*3/uL (ref 0.0–0.7)
Eosinophils Relative: 4.8 % (ref 0.0–5.0)
HCT: 39.6 % (ref 36.0–46.0)
Hemoglobin: 13 g/dL (ref 12.0–15.0)
Lymphocytes Relative: 24.5 % (ref 12.0–46.0)
Lymphs Abs: 1.3 10*3/uL (ref 0.7–4.0)
MCHC: 33 g/dL (ref 30.0–36.0)
MCV: 87.4 fl (ref 78.0–100.0)
Monocytes Absolute: 0.6 10*3/uL (ref 0.1–1.0)
Monocytes Relative: 10.7 % (ref 3.0–12.0)
Neutro Abs: 3.1 10*3/uL (ref 1.4–7.7)
Neutrophils Relative %: 59.1 % (ref 43.0–77.0)
Platelets: 252 10*3/uL (ref 150.0–400.0)
RBC: 4.53 Mil/uL (ref 3.87–5.11)
RDW: 13.6 % (ref 11.5–15.5)
WBC: 5.3 10*3/uL (ref 4.0–10.5)

## 2017-11-23 MED ORDER — BENZONATATE 200 MG PO CAPS: 200.0000 mg | ORAL_CAPSULE | Freq: Three times a day (TID) | ORAL | 3 refills | Status: DC | PRN

## 2017-11-23 NOTE — Progress Notes (Signed)
[facility]  Chief Complaint  Patient presents with  . Follow-up    reports chills and body aches approx 2 weeks ago, pcp was unable to determine the cause, feeling much better today.  Needs refill on tessalon.     Vital signs: BP 118/80 (BP Location: Left Arm, Cuff Size: Normal)   Pulse 82   Ht 5\' 2"  (1.575 m)   Wt 197 lb 6.4 oz (89.5 kg)   SpO2 98%   BMI 36.10 kg/m   History of Present Illness: Sandra [name] is a 42 y.o. female with cough from sarcoidosis.  She had to stop prednisone >> had rash.    Gets intermittent chills and sweats.  Has cough, but not as bad.  Still gets winded, but tries to keep up with her exercise.  Denies fever, hemoptysis, or GI symptoms.  Hasn't seen eye doctor since her dx.   Physical Exam:  General - pleasant Eyes - pupils reactive ENT - no sinus tenderness, no oral exudate, no LAN, mild erythema posterior pharynx Cardiac - regular, no murmur Chest - no wheeze, rales Abd - soft, non tender Ext - no edema Skin - no rashes Neuro - normal strength Psych - normal mood  Assessment/Plan:  Sarcoidosis. - will repeat labs and CT chest with contrast and then determine if she needs to resume tx or monitor clinically - advised her to f/u with her eye doctor - refilled antitussive medication    Patient Instructions  Lab tests today Will schedule CT chest with contrast  Follow up in 3 months    Chesley Mires, MD [place] 11/23/2017, 12:04 PM Pager:  [phone]  Flow Sheet  Pulmonary tests: CT chest 09/13/17 >> bulky mediastinal and hilar LAN, 10 mm nodule Rt perihilar region, 6 mm nodule Rt major fissure PFT 09/23/17 >> FEV1 1.94 (81%), FEV1% 89, TLC 3.69 (76%), DLCO 74% EBUS 09/28/17 >> granulomas  Past Medical History: She  has a past medical history of Anemia, Arthritis, Complication of anesthesia, GERD (gastroesophageal reflux disease), Headache, OAB (overactive  bladder), and PONV (postoperative nausea and vomiting).  Past Surgical History: She  has a past surgical history that includes Cesarean section; Breast surgery; Breast excisional biopsy (Right); Breast excisional biopsy (Left); Hernia repair; Wisdom tooth extraction; Esophagogastroduodenoscopy (05/2017); Cholecystectomy (N/A, 06/28/2017); and Video bronchoscopy with endobronchial ultrasound (N/A, 09/28/2017).  Family History: Her family history includes Asthma in her mother; Cancer in her maternal grandfather, maternal grandmother, and paternal grandmother; Diabetes in her mother; Heart murmur in her mother; Kidney disease in her paternal grandmother; Sleep apnea in her mother.  Social History: She  reports that she has never smoked. She has never used smokeless tobacco. She reports that she drinks about 2.4 oz of alcohol per week. She reports that she does not use drugs.  Medications: Allergies as of 11/23/2017      Reactions   Penicillins Rash, Other (See Comments)   PATIENT HAS HAD A PCN REACTION WITH IMMEDIATE RASH, FACIAL/TONGUE/THROAT SWELLING, SOB, OR LIGHTHEADEDNESS WITH HYPOTENSION:  #  #  #  YES  #  #  #   Has patient had a PCN reaction causing severe rash involving mucus membranes or skin necrosis: no Has patient had a PCN reaction that required hospitalization: no Has patient had a PCN reaction occurring within the last 10 years: no   Influenza Vaccines Other (See Comments)   UNSPECIFIED ILLNESS      Medication List  Accurate as of 11/23/17 12:04 PM. Always use your most recent med list.          benzonatate 200 MG capsule Commonly known as:  TESSALON Take 1 capsule (200 mg total) by mouth 3 (three) times daily as needed for cough.   dexlansoprazole 60 MG capsule Commonly known as:  DEXILANT Take 1 capsule (60 mg total) by mouth 2 (two) times daily.   levonorgestrel 20 MCG/24HR IUD Commonly known as:  MIRENA 1 each by Intrauterine route once.   LINZESS 72 MCG  capsule Generic drug:  linaclotide Take 72 mcg by mouth daily before breakfast.   predniSONE 10 MG tablet Commonly known as:  DELTASONE Take 1 tablet (10 mg total) by mouth daily.   ranitidine 150 MG tablet Commonly known as:  ZANTAC Take 300 mg by mouth 2 (two) times daily.   sodium chloride 0.65 % Soln nasal spray Commonly known as:  OCEAN Place 2 sprays into both nostrils as needed for congestion.   NASOGEL Gel Place 1 spray into both nostrils daily as needed (for moisture).   solifenacin 10 MG tablet Commonly known as:  VESICARE Take 10 mg by mouth daily.

## 2017-11-23 NOTE — Patient Instructions (Signed)
Lab tests today Will schedule CT chest with contrast  Follow up in 3 months

## 2017-11-24 ENCOUNTER — Telehealth: Payer: Self-pay | Admitting: Medical

## 2017-11-24 LAB — QUANTIFERON-TB GOLD PLUS
Mitogen-NIL: 7.23 IU/mL
NIL: 0.07 IU/mL
QuantiFERON-TB Gold Plus: NEGATIVE
TB1-NIL: 0.02 IU/mL
TB2-NIL: <0 IU/mL

## 2017-11-24 NOTE — Telephone Encounter (Signed)
Fill in remainder of boxes at bottom of form, SCAN/copy and fax back

## 2017-11-24 NOTE — Telephone Encounter (Signed)
Pt dropped of form to be filled out put in your folder pt can be reached at [phone]

## 2017-11-25 NOTE — Telephone Encounter (Signed)
done

## 2017-11-26 ENCOUNTER — Telehealth: Payer: Self-pay | Admitting: Pulmonary Disease

## 2017-11-26 NOTE — Telephone Encounter (Signed)
CBC Latest Ref Rng & Units 11/23/2017 09/28/2017 09/01/2017  WBC 4.0 - 10.5 K/uL 5.3 4.4 5.1  Hemoglobin 12.0 - 15.0 g/dL 13.0 12.2 12.5  Hematocrit 36.0 - 46.0 % 39.6 37.9 38.0  Platelets 150.0 - 400.0 K/uL 252.0 252 300    CMP Latest Ref Rng & Units 11/23/2017 09/01/2017  Glucose 70 - 99 mg/dL 97 86  BUN 6 - 23 mg/dL 11 10  Creatinine 0.40 - 1.20 mg/dL 0.83 0.83  Sodium 135 - 145 mEq/L 136 139  Potassium 3.5 - 5.1 mEq/L 4.5 4.2  Chloride 96 - 112 mEq/L 102 101  CO2 19 - 32 mEq/L 29 23  Calcium 8.4 - 10.5 mg/dL 9.1 9.1  Total Protein 6.0 - 8.3 g/dL 7.4 7.4  Total Bilirubin 0.2 - 1.2 mg/dL 0.4 0.5  Alkaline Phos 39 - 117 U/L 66 73  AST 0 - 37 U/L 15 31  ALT 0 - 35 U/L 10 32    Quantiferon gold 11/23/17 >> negative   Please let her know that her lab tests were normal.  Will call again after review of her CT chest.

## 2017-11-29 NOTE — Telephone Encounter (Signed)
Called and spoke with patient regarding results.  Informed the patient of results and recommendations today. Pt verbalized understanding and denied any questions or concerns at this time.  Nothing further needed.  

## 2017-12-02 ENCOUNTER — Ambulatory Visit (INDEPENDENT_AMBULATORY_CARE_PROVIDER_SITE_OTHER)
Admission: RE | Admit: 2017-12-02 | Discharge: 2017-12-02 | Disposition: A | Discharge status: Home / Self Care | Payer: 59 | Source: Ambulatory Visit | Attending: Pulmonary Disease | Admitting: Pulmonary Disease

## 2017-12-02 DIAGNOSIS — R918 Other nonspecific abnormal finding of lung field: Secondary | ICD-10-CM | POA: Diagnosis not present

## 2017-12-02 DIAGNOSIS — D869 Sarcoidosis, unspecified: Secondary | ICD-10-CM | POA: Diagnosis not present

## 2017-12-02 MED ORDER — IOPAMIDOL (ISOVUE-300) INJECTION 61%
80.0000 mL | Freq: Once | INTRAVENOUS | Status: AC | PRN
  Administered 2017-12-02: 80 mL via INTRAVENOUS

## 2017-12-06 ENCOUNTER — Telehealth: Payer: Self-pay | Admitting: Pulmonary Disease

## 2017-12-06 NOTE — Telephone Encounter (Signed)
Called and spoke with patient regarding CT results and VS recommendations. Pt advised that she cannot take prednisone it breaks her out all over her face. Pt is requesting if there is anything else could be prescribed to her for SOB with exertion.  VS please advise.

## 2017-12-06 NOTE — Telephone Encounter (Signed)
Called and spoke with patient, advised that we have not received the results yet.    VS please advise, thank you.

## 2017-12-06 NOTE — Telephone Encounter (Signed)
CT chest 12/02/17 >> stable findings of sarcoidosis   Please let her know that her CT chest is stable.  If her breathing is doing okay, then she doesn't need to resume prednisone at this time.

## 2017-12-09 MED ORDER — METHYLPREDNISOLONE 16 MG PO TABS
16.0000 mg | ORAL_TABLET | Freq: Every day | ORAL | 1 refills | Status: DC
Start: 2017-12-09 — End: 2019-08-07

## 2017-12-09 NOTE — Telephone Encounter (Signed)
Spoke with pt.  She is having increased cough, dyspnea since being off prednisone.  She reports getting open sores on her skin when on prednisone that resolved when prednisone stopped.  She also has swelling in her right leg.    Will try her on medrol in place of prednisone.  She has appointment with PCP to assess leg swelling.  She will need to have ROV with me or NP in 2 to 3 weeks to assess response to medrol.

## 2017-12-09 NOTE — Telephone Encounter (Signed)
Called and spoke to patient. Patient stated that she has been having increased shortness of breath and continues to have the persistent cough. Patient also stated that she is having issues with her leg that she reports VS is aware of. Patient stated her leg is swollen, throbbing, and painful. I let her know the leg issue may need to be address by her PCP.  VS please advise.

## 2017-12-09 NOTE — Telephone Encounter (Signed)
Called spoke with patient  Appt scheduled with SG on 6.3.19 @ 0915 - VS with no openings Appt card mailed to pt's verified home address  Nothing further needed, will sign off.

## 2017-12-09 NOTE — Telephone Encounter (Signed)
Pt is returning call about recommendations from VS. Pt states her leg is throbbing and is swollen and would like to know what she can take for this. [place]. Cb is [phone].

## 2017-12-13 ENCOUNTER — Encounter: Payer: Self-pay | Admitting: Medical

## 2017-12-13 ENCOUNTER — Ambulatory Visit (INDEPENDENT_AMBULATORY_CARE_PROVIDER_SITE_OTHER): Payer: 59 | Admitting: Medical

## 2017-12-13 VITALS — BP 112/78 | HR 76 | Temp 97.8°F | Ht 62.0 in | Wt 198.6 lb

## 2017-12-13 DIAGNOSIS — M79605 Pain in left leg: Secondary | ICD-10-CM

## 2017-12-13 DIAGNOSIS — D869 Sarcoidosis, unspecified: Secondary | ICD-10-CM

## 2017-12-13 DIAGNOSIS — Z7952 Long term (current) use of systemic steroids: Secondary | ICD-10-CM | POA: Diagnosis not present

## 2017-12-13 DIAGNOSIS — R590 Localized enlarged lymph nodes: Secondary | ICD-10-CM

## 2017-12-13 DIAGNOSIS — K219 Gastro-esophageal reflux disease without esophagitis: Secondary | ICD-10-CM

## 2017-12-13 DIAGNOSIS — R05 Cough: Secondary | ICD-10-CM | POA: Diagnosis not present

## 2017-12-13 DIAGNOSIS — R059 Cough, unspecified: Secondary | ICD-10-CM

## 2017-12-13 DIAGNOSIS — S8012XA Contusion of left lower leg, initial encounter: Secondary | ICD-10-CM | POA: Diagnosis not present

## 2017-12-13 DIAGNOSIS — M7989 Other specified soft tissue disorders: Secondary | ICD-10-CM | POA: Insufficient documentation

## 2017-12-13 MED ORDER — BENZONATATE 100 MG PO CAPS
100.0000 mg | ORAL_CAPSULE | Freq: Three times a day (TID) | ORAL | 0 refills | Status: DC
Start: 2017-12-13 — End: 2020-12-27

## 2017-12-13 NOTE — Progress Notes (Signed)
Subjective: Chief Complaint  Patient presents with  . Leg Swelling    left leg   . Shortness of Breath    when climbing stairs   Here for leg concern.  Since I last saw her she went to see pulmonology, ultimately diagnosed with sarcoidosis.   She didn't tolerate prednisone due to facial sores, so ended up being changed to methylprednisolone.   Taking this currently.    She notes ongoing cough, bad GERD.  Still on Dexilant and zantac. tessalon Perles 200mg  makes her sleepy.  Here for left anterior lower leg swelling and pain, bruising.   She hit the same area 2 years ago, but lately she has had bruising and swelling in the area she hit 2 years ago.   No hx/o DVT or clot.     No other aggravating or relieving factors. No other complaint.  Past Medical History:  Diagnosis Date  . Anemia   . Arthritis    knees, hips  . Complication of anesthesia   . GERD (gastroesophageal reflux disease)   . Headache   . OAB (overactive bladder)   . PONV (postoperative nausea and vomiting)    "patch works well."   Current Outpatient Medications on File Prior to Visit  Medication Sig Dispense Refill  . benzonatate (TESSALON) 200 MG capsule Take 1 capsule (200 mg total) by mouth 3 (three) times daily as needed for cough. 30 capsule 3  . dexlansoprazole (DEXILANT) 60 MG capsule Take 1 capsule (60 mg total) by mouth 2 (two) times daily. 30 capsule 2  . levonorgestrel (MIRENA) 20 MCG/24HR IUD 1 each by Intrauterine route once.    . methylPREDNISolone (MEDROL) 16 MG tablet Take 1 tablet (16 mg total) by mouth daily. 30 tablet 1  . ranitidine (ZANTAC) 150 MG tablet Take 300 mg by mouth 2 (two) times daily.    . Saline (NASOGEL) GEL Place 1 spray into both nostrils daily as needed (for moisture).    . sodium chloride (OCEAN) 0.65 % SOLN nasal spray Place 2 sprays into both nostrils as needed for congestion.     No current facility-administered medications on file prior to visit.    ROS as in  subjective    Objective: BP 112/78   Pulse 76   Temp 97.8 F (36.6 C) (Oral)   Ht 5\' 2"  (1.575 m)   Wt 198 lb 9.6 oz (90.1 kg)   SpO2 98%   BMI 36.32 kg/m   Wt Readings from Last 3 Encounters:  12/13/17 198 lb 9.6 oz (90.1 kg)  11/23/17 197 lb 6.4 oz (89.5 kg)  10/11/17 204 lb (92.5 kg)   General appearance: alert, no distress, WD/WN HEENT: normocephalic, sclerae anicteric, TMs pearly, nares patent, no discharge or erythema, pharynx normal Oral cavity: MMM, no lesions Neck: supple, no lymphadenopathy, no thyromegaly, no masses Heart: RRR, normal S1, S2, no murmurs Lungs: decreased breath sounds, otherwise  no wheezes, rhonchi, or rales Pulses: 2+ symmetric, upper and lower extremities, normal cap refill Left lower distal anterior leg with 3cm x 2 cm area of fullness, slightly raised area, oval shaped, somewhat tender, purple brown faint coloration of skin in same area, tender to touch otherwise leg nontender, no other bruising or deformity    Assessment: Encounter Diagnoses  Name Primary?  . Sarcoidosis Yes  . Mediastinal lymphadenopathy   . Cough   . Gastroesophageal reflux disease without esophagitis   . Leg swelling   . Pain of left lower extremity   . Contusion  of left lower leg, initial encounter   . Long term systemic steroid user      Plan: I reviewed recent pulmonology notes in chart from 11/23/17 with Dr. Chesley Mires.    Reviewed her CBC and CMET labs form 11/23/17.  Leg swelling - reviewed case with Dr. Redmond School who also examined her.   Area suggestive of hematoma although she denies any recent trauma.  Its possible it could be varicosity or superficial thrombophlebitis.  advised alternating heat and ice with leg elevation BID this week.   advised no sign of DVT or more worrisome issue.     Cough, sarcoidosis - since she gets daytime sedation with 200mg  Tessalon Perles, will try 100mg  Tessalon  GERD - discussed strict avoidance of trigger foods given her  GERD severity, cough, sarcoidosis and already on 2 medications for this.  She is still eating some common GERD trigger foods.    Long term steroid use - plan Bone Density in next 6 months.     Jerre was seen today for leg swelling and shortness of breath.  Diagnoses and all orders for this visit:  Sarcoidosis  Mediastinal lymphadenopathy  Cough  Gastroesophageal reflux disease without esophagitis  Leg swelling  Pain of left lower extremity  Contusion of left lower leg, initial encounter  Long term systemic steroid user  Other orders -     benzonatate (TESSALON) 100 MG capsule; Take 1 capsule (100 mg total) by mouth 3 (three) times daily.

## 2017-12-27 ENCOUNTER — Encounter: Payer: Self-pay | Admitting: Acute Care

## 2017-12-27 ENCOUNTER — Ambulatory Visit: Payer: 59 | Admitting: Acute Care

## 2017-12-27 NOTE — Progress Notes (Deleted)
History of Present Illness Sandra [name] is a 42 y.o. female never smoker with sarcoidosis. She is followed by Sandra [name].   12/27/2017 2-3 Week follow up per Sandra [name]. Pt. Presents for follow up. She was seen by Sandra [name] 11/23/2017 for body chills and cough. Plan was to repeat CT Chest and labs to  determine if she needs to resume tx or monitor clinically.  Sandra [name] advised her to f/u with her eye doctor  And he  refilled antitussive medication. She is here for follow up. CT Chest showed no progression and labs were normal. She remains dyspneic , with leg swelling. She is unable to take prednisone due to rash, therefore she was started on Medrol. She is here for follow up.   Pt. States she is doing  Test Results CT Chest 11/2017 IMPRESSION: 1. Stable appearance of mediastinal and bilateral hilar lymphadenopathy with stable pleural nodularity. Imaging features compatible with reported clinical history of sarcoidosis. 2. No new or progressive interval findings.  CT chest 09/13/17 >> bulky mediastinal and hilar LAN, 10 mm nodule Rt perihilar region, 6 mm nodule Rt major fissure PFT 09/23/17 >> FEV1 1.94 (81%), FEV1% 89, TLC 3.69 (76%), DLCO 74% EBUS 09/28/17 >> granulomas  CBC Latest Ref Rng & Units 11/23/2017 09/28/2017 09/01/2017  WBC 4.0 - 10.5 K/uL 5.3 4.4 5.1  Hemoglobin 12.0 - 15.0 g/dL 13.0 12.2 12.5  Hematocrit 36.0 - 46.0 % 39.6 37.9 38.0  Platelets 150.0 - 400.0 K/uL 252.0 252 300    BMP Latest Ref Rng & Units 11/23/2017 09/01/2017  Glucose 70 - 99 mg/dL 97 86  BUN 6 - 23 mg/dL 11 10  Creatinine 0.40 - 1.20 mg/dL 0.83 0.83  BUN/Creat Ratio 9 - 23 - 12  Sodium 135 - 145 mEq/L 136 139  Potassium 3.5 - 5.1 mEq/L 4.5 4.2  Chloride 96 - 112 mEq/L 102 101  CO2 19 - 32 mEq/L 29 23  Calcium 8.4 - 10.5 mg/dL 9.1 9.1    BNP No results found for: BNP  ProBNP No results found for: PROBNP  PFT    Component Value Date/Time   FEV1PRE 1.94 09/23/2017 0835   FEV1POST 1.93 09/23/2017  0835   FVCPRE 2.19 09/23/2017 0835   FVCPOST 2.27 09/23/2017 0835   TLC 3.69 09/23/2017 0835   DLCOUNC 16.52 09/23/2017 0835   PREFEV1FVCRT 89 09/23/2017 0835   PSTFEV1FVCRT 85 09/23/2017 0835    Ct Chest W Contrast  Result Date: 12/02/2017 CLINICAL DATA:  Sarcoidosis with chronic cough and body aches. EXAM: CT CHEST WITH CONTRAST TECHNIQUE: Multidetector CT imaging of the chest was performed during intravenous contrast administration. CONTRAST:  39mL ISOVUE-300 IOPAMIDOL (ISOVUE-300) INJECTION 61% COMPARISON:  09/13/2017 FINDINGS: Cardiovascular: The heart size is normal. No pericardial effusion. No thoracic aortic aneurysm. Mediastinum/Nodes: Mediastinal and hilar lymphadenopathy again noted. No substantial interval change. Previously measured prevascular lymph node at 13 mm short axis is 14 mm short axis today (image 42/series 2). Previously measured upper right hilar lymph node at 18 mm measures 19 mm today. Subcarinal lymph node measured previously at 16 mm is stable at 16 mm. Inferior right hilar lymph node measures 14 mm today compared to 15 mm when I measure it it in a similar fashion on the prior study. Small distal paraesophageal lymph node is stable. The esophagus has normal imaging features. There is no axillary lymphadenopathy. Lungs/Pleura: The central tracheobronchial airways are patent. 5 mm right perifissural nodule (44/3) was 6 mm previously. Another right perifissural nodule (  54/3) is 9 mm today compared to 10 mm previously. Additional tiny bilateral subpleural pulmonary nodules are similar. No focal airspace consolidation. No pulmonary edema or pleural effusion. Upper Abdomen: 2 cm posterior right liver lesion (102/2) is stable in the interval with features compatible with cavernous hemangioma. 6 mm low-density lesion inferior right liver cannot be fully characterized. Musculoskeletal: Bone windows reveal no worrisome lytic or sclerotic osseous lesions. IMPRESSION: 1. Stable appearance  of mediastinal and bilateral hilar lymphadenopathy with stable pleural nodularity. Imaging features compatible with reported clinical history of sarcoidosis. 2. No new or progressive interval findings. Electronically Signed   By: Sandra [name] M.D.   On: 12/02/2017 18:15     Past medical hx Past Medical History:  Diagnosis Date  . Anemia   . Arthritis    knees, hips  . Complication of anesthesia   . GERD (gastroesophageal reflux disease)   . Headache   . OAB (overactive bladder)   . PONV (postoperative nausea and vomiting)    "patch works well."     Social History   Tobacco Use  . Smoking status: Never Smoker  . Smokeless tobacco: Never Used  Substance Use Topics  . Alcohol use: Yes    Alcohol/week: 2.4 oz    Types: 2 Glasses of wine, 2 Shots of liquor per week    Frequency: Never    Comment: ocasional  . Drug use: No    Sandra [name] reports that she has never smoked. She has never used smokeless tobacco. She reports that she drinks about 2.4 oz of alcohol per week. She reports that she does not use drugs.  Tobacco Cessation: Counseling given: Not Answered   Past surgical hx, Family hx, Social hx all reviewed.  Current Outpatient Medications on File Prior to Visit  Medication Sig  . benzonatate (TESSALON) 100 MG capsule Take 1 capsule (100 mg total) by mouth 3 (three) times daily.  . benzonatate (TESSALON) 200 MG capsule Take 1 capsule (200 mg total) by mouth 3 (three) times daily as needed for cough.  . dexlansoprazole (DEXILANT) 60 MG capsule Take 1 capsule (60 mg total) by mouth 2 (two) times daily.  Marland Kitchen levonorgestrel (MIRENA) 20 MCG/24HR IUD 1 each by Intrauterine route once.  . methylPREDNISolone (MEDROL) 16 MG tablet Take 1 tablet (16 mg total) by mouth daily.  . ranitidine (ZANTAC) 150 MG tablet Take 300 mg by mouth 2 (two) times daily.  . Saline (NASOGEL) GEL Place 1 spray into both nostrils daily as needed (for moisture).  . sodium chloride (OCEAN) 0.65 % SOLN  nasal spray Place 2 sprays into both nostrils as needed for congestion.   No current facility-administered medications on file prior to visit.      Allergies  Allergen Reactions  . Penicillins Rash and Other (See Comments)    PATIENT HAS HAD A PCN REACTION WITH IMMEDIATE RASH, FACIAL/TONGUE/THROAT SWELLING, SOB, OR LIGHTHEADEDNESS WITH HYPOTENSION:  #  #  #  YES  #  #  #   Has patient had a PCN reaction causing severe rash involving mucus membranes or skin necrosis: no Has patient had a PCN reaction that required hospitalization: no Has patient had a PCN reaction occurring within the last 10 years: no   . Influenza Vaccines Other (See Comments)    UNSPECIFIED ILLNESS  . Prednisone Other (See Comments)    Causes open sores     Review Of Systems:  Constitutional:   No  weight loss, night sweats,  Fevers, chills, fatigue, or  lassitude.  HEENT:   No headaches,  Difficulty swallowing,  Tooth/dental problems, or  Sore throat,                No sneezing, itching, ear ache, nasal congestion, post nasal drip,   CV:  No chest pain,  Orthopnea, PND, swelling in lower extremities, anasarca, dizziness, palpitations, syncope.   GI  No heartburn, indigestion, abdominal pain, nausea, vomiting, diarrhea, change in bowel habits, loss of appetite, bloody stools.   Resp: No shortness of breath with exertion or at rest.  No excess mucus, no productive cough,  No non-productive cough,  No coughing up of blood.  No change in color of mucus.  No wheezing.  No chest wall deformity  Skin: no rash or lesions.  GU: no dysuria, change in color of urine, no urgency or frequency.  No flank pain, no hematuria   MS:  No joint pain or swelling.  No decreased range of motion.  No back pain.  Psych:  No change in mood or affect. No depression or anxiety.  No memory loss.   Vital Signs There were no vitals taken for this visit.   Physical Exam:  General- No distress,  A&Ox3 ENT: No sinus tenderness, TM  clear, pale nasal mucosa, no oral exudate,no post nasal drip, no LAN Cardiac: S1, S2, regular rate and rhythm, no murmur Chest: No wheeze/ rales/ dullness; no accessory muscle use, no nasal flaring, no sternal retractions Abd.: Soft Non-tender Ext: No clubbing cyanosis, edema Neuro:  normal strength Skin: No rashes, warm and dry Psych: normal mood and behavior   Assessment/Plan  No problem-specific Assessment & Plan notes found for this encounter.    Magdalen Spatz, NP 12/27/2017  8:32 AM

## 2018-02-14 ENCOUNTER — Encounter: Payer: Self-pay | Admitting: Pulmonary Disease

## 2018-02-21 ENCOUNTER — Encounter: Payer: Self-pay | Admitting: Emergency Medicine

## 2018-05-12 ENCOUNTER — Other Ambulatory Visit: Payer: Self-pay | Admitting: Medical

## 2018-05-12 DIAGNOSIS — Z1231 Encounter for screening mammogram for malignant neoplasm of breast: Secondary | ICD-10-CM

## 2018-06-20 ENCOUNTER — Ambulatory Visit
Admission: RE | Admit: 2018-06-20 | Discharge: 2018-06-20 | Disposition: A | Discharge status: Home / Self Care | Payer: 59 | Source: Ambulatory Visit | Attending: Medical | Admitting: Medical

## 2018-06-20 DIAGNOSIS — Z1231 Encounter for screening mammogram for malignant neoplasm of breast: Secondary | ICD-10-CM

## 2018-06-20 DIAGNOSIS — M4696 Unspecified inflammatory spondylopathy, lumbar region: Secondary | ICD-10-CM | POA: Diagnosis not present

## 2018-06-20 DIAGNOSIS — M545 Low back pain: Secondary | ICD-10-CM | POA: Diagnosis not present

## 2018-06-20 NOTE — Progress Notes (Signed)
I am happy to report that her mammogram was normal, no worrisome findings.

## 2018-06-30 DIAGNOSIS — M545 Low back pain: Secondary | ICD-10-CM | POA: Diagnosis not present

## 2018-07-04 DIAGNOSIS — M4696 Unspecified inflammatory spondylopathy, lumbar region: Secondary | ICD-10-CM | POA: Diagnosis not present

## 2018-07-07 DIAGNOSIS — M4696 Unspecified inflammatory spondylopathy, lumbar region: Secondary | ICD-10-CM | POA: Diagnosis not present

## 2018-07-11 DIAGNOSIS — M47816 Spondylosis without myelopathy or radiculopathy, lumbar region: Secondary | ICD-10-CM | POA: Diagnosis not present

## 2018-07-12 ENCOUNTER — Other Ambulatory Visit: Payer: Self-pay | Admitting: Physical Medicine and Rehabilitation

## 2018-07-12 DIAGNOSIS — M4696 Unspecified inflammatory spondylopathy, lumbar region: Secondary | ICD-10-CM | POA: Diagnosis not present

## 2018-07-12 DIAGNOSIS — M47816 Spondylosis without myelopathy or radiculopathy, lumbar region: Secondary | ICD-10-CM

## 2018-07-14 DIAGNOSIS — M25551 Pain in right hip: Secondary | ICD-10-CM | POA: Diagnosis not present

## 2018-07-14 DIAGNOSIS — M4696 Unspecified inflammatory spondylopathy, lumbar region: Secondary | ICD-10-CM | POA: Diagnosis not present

## 2018-07-19 DIAGNOSIS — M4696 Unspecified inflammatory spondylopathy, lumbar region: Secondary | ICD-10-CM | POA: Diagnosis not present

## 2018-07-21 DIAGNOSIS — M4696 Unspecified inflammatory spondylopathy, lumbar region: Secondary | ICD-10-CM | POA: Diagnosis not present

## 2018-07-22 ENCOUNTER — Other Ambulatory Visit: Payer: Self-pay | Admitting: Physical Medicine and Rehabilitation

## 2018-07-22 ENCOUNTER — Ambulatory Visit
Admission: RE | Admit: 2018-07-22 | Discharge: 2018-07-22 | Disposition: A | Discharge status: Home / Self Care | Payer: 59 | Source: Ambulatory Visit | Attending: Physical Medicine and Rehabilitation | Admitting: Physical Medicine and Rehabilitation

## 2018-07-22 DIAGNOSIS — M545 Low back pain: Secondary | ICD-10-CM | POA: Diagnosis not present

## 2018-07-22 DIAGNOSIS — M47816 Spondylosis without myelopathy or radiculopathy, lumbar region: Secondary | ICD-10-CM

## 2018-07-22 MED ORDER — METHYLPREDNISOLONE ACETATE 40 MG/ML INJ SUSP (RADIOLOG
120.0000 mg | Freq: Once | INTRAMUSCULAR | Status: AC
  Administered 2018-07-22: 120 mg via INTRA_ARTICULAR

## 2018-07-22 MED ORDER — IOPAMIDOL (ISOVUE-M 200) INJECTION 41%
1.0000 mL | Freq: Once | INTRAMUSCULAR | Status: AC
  Administered 2018-07-22: 1 mL via INTRA_ARTICULAR

## 2018-07-22 NOTE — Discharge Instructions (Signed)

## 2018-07-26 DIAGNOSIS — M4696 Unspecified inflammatory spondylopathy, lumbar region: Secondary | ICD-10-CM | POA: Diagnosis not present

## 2018-10-13 ENCOUNTER — Other Ambulatory Visit: Payer: Self-pay | Admitting: Pulmonary Disease

## 2019-02-21 ENCOUNTER — Telehealth: Payer: Self-pay | Admitting: Medical

## 2019-02-21 NOTE — Telephone Encounter (Signed)
Dismissal letter in guarantor snapshot  °

## 2019-07-31 ENCOUNTER — Ambulatory Visit (INDEPENDENT_AMBULATORY_CARE_PROVIDER_SITE_OTHER): Payer: BC Managed Care – PPO | Admitting: Medical

## 2019-07-31 ENCOUNTER — Encounter: Payer: Self-pay | Admitting: Medical

## 2019-07-31 ENCOUNTER — Other Ambulatory Visit: Payer: Self-pay

## 2019-07-31 VITALS — BP 112/68 | HR 78 | Temp 97.7°F | Ht 62.0 in | Wt 197.2 lb

## 2019-07-31 DIAGNOSIS — Z7189 Other specified counseling: Secondary | ICD-10-CM

## 2019-07-31 DIAGNOSIS — Z113 Encounter for screening for infections with a predominantly sexual mode of transmission: Secondary | ICD-10-CM | POA: Diagnosis not present

## 2019-07-31 DIAGNOSIS — D869 Sarcoidosis, unspecified: Secondary | ICD-10-CM | POA: Diagnosis not present

## 2019-07-31 DIAGNOSIS — R0683 Snoring: Secondary | ICD-10-CM

## 2019-07-31 DIAGNOSIS — N946 Dysmenorrhea, unspecified: Secondary | ICD-10-CM | POA: Diagnosis not present

## 2019-07-31 DIAGNOSIS — Z23 Encounter for immunization: Secondary | ICD-10-CM | POA: Diagnosis not present

## 2019-07-31 DIAGNOSIS — R918 Other nonspecific abnormal finding of lung field: Secondary | ICD-10-CM | POA: Insufficient documentation

## 2019-07-31 DIAGNOSIS — Z0000 Encounter for general adult medical examination without abnormal findings: Secondary | ICD-10-CM

## 2019-07-31 DIAGNOSIS — R0681 Apnea, not elsewhere classified: Secondary | ICD-10-CM

## 2019-07-31 DIAGNOSIS — R0602 Shortness of breath: Secondary | ICD-10-CM

## 2019-07-31 DIAGNOSIS — Z7185 Encounter for immunization safety counseling: Secondary | ICD-10-CM

## 2019-07-31 DIAGNOSIS — T8332XA Displacement of intrauterine contraceptive device, initial encounter: Secondary | ICD-10-CM | POA: Insufficient documentation

## 2019-07-31 LAB — POCT URINE PREGNANCY: Preg Test, Ur: NEGATIVE

## 2019-07-31 NOTE — Progress Notes (Signed)
Subjective:   HPI  Sandra [name] is a 44 y.o. female who presents for physical Chief Complaint  Patient presents with  . Annual Exam    non fasting     Patient Care Team: Mann Skaggs, Camelia Eng, PA-C as PCP - General (Family Medicine) Sees dentist Sees eye doctor  Concerns: Has been having some issues with her eyes, blurry vision.  Her eye doctor recently put her on steroids for possible ophthalmology involvement of sarcoidosis.   Sees eye doctor again tomorrow.    Mainly having blurred vision.  No pain.  Last steroid use was 2 months ago.   Lately for weeks been feeling sluggish, fatigue, and gets stiff.    No family hx/o rheumatoid or auto immune disease.  No specific joint pains, but gets stiff, worse in the morning.  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  She has a Mirena but cannot seem to feel her strings.  Mirena was placed mirena placed  01/31/15, due to come out 01/31/20.  Last month she bled the whole month after coming off steroids  No other complaint  Past Medical History:  Diagnosis Date  . Anemia   . Arthritis    knees, hips  . Complication of anesthesia   . GERD (gastroesophageal reflux disease)   . Headache   . OAB (overactive bladder)   . PONV (postoperative nausea and vomiting)    "patch works well."  . Sarcoidosis 2019   Dr. Chesley Mires    Past Surgical History:  Procedure Laterality Date  . BREAST EXCISIONAL BIOPSY Right    2015  . BREAST EXCISIONAL BIOPSY Left    2007 Papilloma removed  . BREAST SURGERY     lt breast hematoma  . CESAREAN SECTION     x 2  . CHOLECYSTECTOMY N/A 06/28/2017   Procedure: LAPAROSCOPIC CHOLECYSTECTOMY;  Surgeon: Coralie Keens, MD;  Location: [facility];  Service: General;  Laterality: N/A;  . ESOPHAGOGASTRODUODENOSCOPY  05/2017   Dr. Collene Mares  . HERNIA REPAIR     Umbilical Hernia  . VIDEO BRONCHOSCOPY WITH ENDOBRONCHIAL ULTRASOUND N/A 09/28/2017   Procedure: VIDEO BRONCHOSCOPY WITH  ENDOBRONCHIAL ULTRASOUND;  Surgeon: Collene Gobble, MD;  Location: [facility] OR;  Service: Thoracic;  Laterality: N/A;  . WISDOM TOOTH EXTRACTION     2    Social History   Socioeconomic History  . Marital status: Significant Other    Spouse name: Not on file  . Number of children: Not on file  . Years of education: Not on file  . Highest education level: Not on file  Occupational History  . Not on file  Tobacco Use  . Smoking status: Never Smoker  . Smokeless tobacco: Never Used  Substance and Sexual Activity  . Alcohol use: Yes    Alcohol/week: 4.0 standard drinks    Types: 2 Glasses of wine, 2 Shots of liquor per week    Comment: ocasional  . Drug use: No  . Sexual activity: Yes    Birth control/protection: Surgical  Other Topics Concern  . Not on file  Social History Narrative   Lives with boyfriend, 6yo and 31yo daughter.  Does Occupational psychologist for [employer].  Exercise - walking 4-7 mi every morning at 5am.    07/2019   Social Determinants of Health   Financial Resource Strain:   . Difficulty of Paying Living Expenses: Not on file  Food Insecurity:   . Worried About Charity fundraiser in  the Last Year: Not on file  . Ran Out of Food in the Last Year: Not on file  Transportation Needs:   . Lack of Transportation (Medical): Not on file  . Lack of Transportation (Non-Medical): Not on file  Physical Activity:   . Days of Exercise per Week: Not on file  . Minutes of Exercise per Session: Not on file  Stress:   . Feeling of Stress : Not on file  Social Connections:   . Frequency of Communication with Friends and Family: Not on file  . Frequency of Social Gatherings with Friends and Family: Not on file  . Attends Religious Services: Not on file  . Active Member of Clubs or Organizations: Not on file  . Attends Archivist Meetings: Not on file  . Marital Status: Not on file  Intimate Partner Violence:   . Fear of Current or Ex-Partner: Not on file  .  Emotionally Abused: Not on file  . Physically Abused: Not on file  . Sexually Abused: Not on file    Family History  Problem Relation Age of Onset  . Asthma Mother   . Heart murmur Mother   . Diabetes Mother   . Sleep apnea Mother   . Cancer Maternal Grandmother        stomach  . Cancer Maternal Grandfather        prostate  . Cancer Paternal Grandmother        breast  . Kidney disease Paternal Grandmother        dialysis  . Sarcoidosis Neg Hx   . Heart disease Neg Hx   . Stroke Neg Hx   . Breast cancer Neg Hx      Current Outpatient Medications:  .  benzonatate (TESSALON) 100 MG capsule, Take 1 capsule (100 mg total) by mouth 3 (three) times daily., Disp: 30 capsule, Rfl: 0 .  levonorgestrel (MIRENA) 20 MCG/24HR IUD, 1 each by Intrauterine route once., Disp: , Rfl:  .  ranitidine (ZANTAC) 150 MG tablet, Take 300 mg by mouth 2 (two) times daily., Disp: , Rfl:  .  Saline (NASOGEL) GEL, Place 1 spray into both nostrils daily as needed (for moisture)., Disp: , Rfl:  .  sodium chloride (OCEAN) 0.65 % SOLN nasal spray, Place 2 sprays into both nostrils as needed for congestion., Disp: , Rfl:  .  benzonatate (TESSALON) 200 MG capsule, Take 1 capsule (200 mg total) by mouth 3 (three) times daily as needed for cough. (Patient not taking: Reported on 07/31/2019), Disp: 30 capsule, Rfl: 3 .  dexlansoprazole (DEXILANT) 60 MG capsule, Take 1 capsule (60 mg total) by mouth 2 (two) times daily. (Patient not taking: Reported on 07/31/2019), Disp: 30 capsule, Rfl: 2 .  methylPREDNISolone (MEDROL) 16 MG tablet, Take 1 tablet (16 mg total) by mouth daily. (Patient not taking: Reported on 07/31/2019), Disp: 30 tablet, Rfl: 1  Allergies  Allergen Reactions  . Penicillins Rash and Other (See Comments)    PATIENT HAS HAD A PCN REACTION WITH IMMEDIATE RASH, FACIAL/TONGUE/THROAT SWELLING, SOB, OR LIGHTHEADEDNESS WITH HYPOTENSION:  #  #  #  YES  #  #  #   Has patient had a PCN reaction causing severe rash  involving mucus membranes or skin necrosis: no Has patient had a PCN reaction that required hospitalization: no Has patient had a PCN reaction occurring within the last 10 years: no   . Influenza Vaccines Other (See Comments)    UNSPECIFIED ILLNESS  . Prednisone Other (  See Comments)    Causes open sores       Reviewed their medical, surgical, family, social, medication, and allergy history and updated chart as appropriate.  Review of Systems Constitutional: -fever, -chills, -sweats, -unexpected weight change, -decreased appetite, +fatigue Allergy: -sneezing, -itching, -congestion Dermatology: -changing moles, --rash, -lumps ENT: -runny nose, -ear pain, -sore throat, -hoarseness, -sinus pain, -teeth pain, - ringing in ears, -hearing loss, -nosebleeds Cardiology: -chest pain, -palpitations, -swelling, -difficulty breathing when lying flat, -waking up short of breath Respiratory: -cough, -shortness of breath, -difficulty breathing with exercise or exertion, -wheezing, -coughing up blood Gastroenterology: -abdominal pain, -nausea, -vomiting, -diarrhea, -constipation, -blood in stool, -changes in bowel movement, -difficulty swallowing or eating Hematology: -bleeding, -bruising  Musculoskeletal: +joint aches, +muscle aches, -joint swelling, -back pain, -neck pain, -cramping, -changes in gait Ophthalmology: no eye redness, itching, discharge Urology: -burning with urination, -difficulty urinating, -blood in urine, -urinary frequency, -urgency, -incontinence Neurology: -headache, -weakness, -tingling, -numbness, -memory loss, -falls, -dizziness Psychology: -depressed mood, -agitation, -sleep problems Breast/gyn: -breast tendnerss, -discharge, -lumps, -vaginal discharge,- irregular periods, -heavy periods       Objective:  BP 112/68   Pulse 78   Temp 97.7 F (36.5 C)   Ht 5\' 2"  (1.575 m)   Wt 197 lb 3.2 oz (89.4 kg)   SpO2 98%   BMI 36.07 kg/m   General appearance: alert, no  distress, WD/WN, African-American female Skin: No worrisome lesions HEENT: normocephalic, conjunctiva/corneas normal, sclerae anicteric, PERRLA, EOMi, nares patent, no discharge or erythema, pharynx normal Oral cavity: MMM, tongue normal, teeth normal Neck: supple, no lymphadenopathy, no thyromegaly, no masses, normal ROM, no bruits Chest: non tender, normal shape and expansion Heart: RRR, normal S1, S2, no murmurs Lungs: CTA bilaterally, no wheezes, rhonchi, or rales Abdomen: +bs, soft, non tender, non distended, no masses, no hepatomegaly, no splenomegaly, no bruits Back: non tender, normal ROM, no scoliosis Musculoskeletal: upper extremities non tender, no obvious deformity, normal ROM throughout, lower extremities non tender, no obvious deformity, normal ROM throughout Extremities: no edema, no cyanosis, no clubbing Pulses: 2+ symmetric, upper and lower extremities, normal cap refill Neurological: alert, oriented x 3, CN2-12 intact, strength normal upper extremities and lower extremities, sensation normal throughout, DTRs 2+ throughout, no cerebellar signs, gait normal Psychiatric: normal affect, behavior normal, pleasant  Breast: nontender, no masses or lumps, no skin changes, no nipple discharge or inversion, no axillary lymphadenopathy Gyn: Normal external genitalia without lesions, vagina with normal mucosa, cervix without lesions, no cervical motion tenderness, no abnormal vaginal discharge.  Uterus and adnexa not enlarged.  She seems to be slightly tender of the left adnexa.  IUD strings are not visible.  No mass palpated. exam chaperoned by nurse. Rectal: Anus normal appearing    Assessment and Plan :   Encounter Diagnoses  Name Primary?  . Encounter for health maintenance examination in adult Yes  . Sarcoidosis   . Screen for STD (sexually transmitted disease)   . Snoring   . Witnessed apneic spells   . Vaccine counseling   . SOB (shortness of breath)   . Need for Tdap  vaccination   . Need for pneumococcal vaccination   . Dysmenorrhea   . Intrauterine contraceptive device threads lost, initial encounter   . Other nonspecific abnormal finding of lung field     Physical exam - discussed and counseled on healthy lifestyle, diet, exercise, preventative care, vaccinations, sick and well care, proper use of emergency dept and after hours care, and addressed their concerns.  Health screening: Advised they see their eye doctor yearly for routine vision care. Advised they see their dentist yearly for routine dental care including hygiene visits twice yearly.  Discussed STD testing, discussed prevention, condom use, means of transmission  Cancer screening Counseled on self breast exams, mammograms, cervical cancer screening  Advised updated mammmogram  Reviewed 2019 pap that was normal   Vaccinations: Prior adverse reaction to flu vaccine Counseled on the pneumococcal vaccine.  Vaccine information sheet given.  Pneumococcal vaccine PPSV23 given after consent obtained.  Counseled on the Tdap (tetanus, diptheria, and acellular pertussis) vaccine.  Vaccine information sheet given. Tdap vaccine given after consent obtained.   Separate significant chronic issues discussed: IUD strings lost - pending labs, likely referral to gyn for further eval  Sarcoidosis, decreased lung sounds in lower lobes -labs today, and plan follow-up with pulmonology  Snoring, fatigue-could be fatigue related to sarcoid but consider sleep study, routine labs today  Stiffness, aches-possibly related to sarcoid.  Follow-up pending labs  Senie was seen today for annual exam.  Diagnoses and all orders for this visit:  Encounter for health maintenance examination in adult -     Comprehensive metabolic panel -     CBC with Differential -     TSH regular -     SED RATE regular -     HIV regular -     RPR regular -     GC/Chlamydia Probe Amp -     Hepatitis C antibody -      Hepatitis B surface antigen -     POCT urine pregnancy  Sarcoidosis  Screen for STD (sexually transmitted disease) -     HIV regular -     RPR regular -     GC/Chlamydia Probe Amp -     Hepatitis C antibody -     Hepatitis B surface antigen  Snoring  Witnessed apneic spells  Vaccine counseling  SOB (shortness of breath)  Need for Tdap vaccination -     Tdap vaccine greater than or equal to 7yo IM  Need for pneumococcal vaccination -     Pneumococcal polysaccharide vaccine 23-valent greater than or equal to 2yo subcutaneous/IM  Dysmenorrhea -     POCT urine pregnancy  Intrauterine contraceptive device threads lost, initial encounter  Other nonspecific abnormal finding of lung field   Follow-up pending labs, yearly for physical

## 2019-08-01 LAB — COMPREHENSIVE METABOLIC PANEL WITH GFR
ALT: 9 IU/L (ref 0–32)
AST: 15 IU/L (ref 0–40)
Albumin/Globulin Ratio: 1.6 (ref 1.2–2.2)
Albumin: 4.2 g/dL (ref 3.8–4.8)
Alkaline Phosphatase: 69 IU/L (ref 39–117)
BUN/Creatinine Ratio: 14 (ref 9–23)
BUN: 12 mg/dL (ref 6–24)
Bilirubin Total: 0.2 mg/dL (ref 0.0–1.2)
CO2: 24 mmol/L (ref 20–29)
Calcium: 9.2 mg/dL (ref 8.7–10.2)
Chloride: 101 mmol/L (ref 96–106)
Creatinine, Ser: 0.84 mg/dL (ref 0.57–1.00)
GFR calc Af Amer: 98 mL/min/1.73
GFR calc non Af Amer: 85 mL/min/1.73
Globulin, Total: 2.6 g/dL (ref 1.5–4.5)
Glucose: 85 mg/dL (ref 65–99)
Potassium: 4.4 mmol/L (ref 3.5–5.2)
Sodium: 138 mmol/L (ref 134–144)
Total Protein: 6.8 g/dL (ref 6.0–8.5)

## 2019-08-01 LAB — CBC WITH DIFFERENTIAL/PLATELET
Basophils Absolute: 0 10*3/uL (ref 0.0–0.2)
Basos: 1 %
EOS (ABSOLUTE): 0.2 10*3/uL (ref 0.0–0.4)
Eos: 3 %
Hematocrit: 40.1 % (ref 34.0–46.6)
Hemoglobin: 13.1 g/dL (ref 11.1–15.9)
Immature Grans (Abs): 0 10*3/uL (ref 0.0–0.1)
Immature Granulocytes: 0 %
Lymphocytes Absolute: 1.5 10*3/uL (ref 0.7–3.1)
Lymphs: 29 %
MCH: 29.4 pg (ref 26.6–33.0)
MCHC: 32.7 g/dL (ref 31.5–35.7)
MCV: 90 fL (ref 79–97)
Monocytes Absolute: 0.5 10*3/uL (ref 0.1–0.9)
Monocytes: 10 %
Neutrophils Absolute: 2.8 10*3/uL (ref 1.4–7.0)
Neutrophils: 57 %
Platelets: 249 10*3/uL (ref 150–450)
RBC: 4.46 x10E6/uL (ref 3.77–5.28)
RDW: 12.4 % (ref 11.7–15.4)
WBC: 5 10*3/uL (ref 3.4–10.8)

## 2019-08-01 LAB — GC/CHLAMYDIA PROBE AMP
Chlamydia trachomatis, NAA: NEGATIVE
Neisseria Gonorrhoeae by PCR: NEGATIVE

## 2019-08-01 LAB — SYPHILIS: RPR W/REFLEX TO RPR TITER AND TREPONEMAL ANTIBODIES, TRADITIONAL SCREENING AND DIAGNOSIS ALGORITHM: RPR Ser Ql: NONREACTIVE

## 2019-08-01 LAB — HEPATITIS C ANTIBODY: Hep C Virus Ab: <0.1 s/co ratio (ref 0.0–0.9)

## 2019-08-01 LAB — HEPATITIS B SURFACE ANTIGEN: Hepatitis B Surface Ag: NEGATIVE

## 2019-08-01 LAB — SEDIMENTATION RATE: Sed Rate: 11 mm/h (ref 0–32)

## 2019-08-01 LAB — HIV ANTIBODY (ROUTINE TESTING W REFLEX): HIV Screen 4th Generation wRfx: NONREACTIVE

## 2019-08-01 LAB — TSH: TSH: 1.65 u[IU]/mL (ref 0.450–4.500)

## 2019-08-07 ENCOUNTER — Other Ambulatory Visit: Payer: Self-pay

## 2019-08-07 ENCOUNTER — Encounter: Payer: Self-pay | Admitting: Pulmonary Disease

## 2019-08-07 ENCOUNTER — Ambulatory Visit (INDEPENDENT_AMBULATORY_CARE_PROVIDER_SITE_OTHER): Payer: BC Managed Care – PPO | Admitting: Pulmonary Disease

## 2019-08-07 VITALS — BP 90/60 | HR 75 | Temp 98.2°F | Ht 62.0 in | Wt 196.2 lb

## 2019-08-07 DIAGNOSIS — D869 Sarcoidosis, unspecified: Secondary | ICD-10-CM | POA: Diagnosis not present

## 2019-08-07 NOTE — Patient Instructions (Signed)
Will arrange for referral to rheumatology to assess joint involvement from sarcoidosis  Follow up in 6 months

## 2019-08-07 NOTE — Progress Notes (Signed)
[facility]  Chief Complaint  Patient presents with  . Follow-up    sarcoidosis    Constitutional:  BP 90/60 (BP Location: Right Arm, Cuff Size: Normal)   Pulse 75   Temp 98.2 F (36.8 C) (Temporal)   Ht 5\' 2"  (1.575 m)   Wt 196 lb 3.2 oz (89 kg)   SpO2 100% Comment: RA  BMI 35.89 kg/m   Past Medical History:  Anemia, GERD, HA, Overactive bladder  Brief Summary:  Sandra [name] is a 44 y.o. female with cough from sarcoidosis.  She has been off prednisone for past several months.  Not having much cough or sputum.  Denies chest pain, fever, skin rash.  Joints have been hurting more since she has been off prednisone.  Mostly in her knuckles, wrists, elbows, knees, and hips.  She has noticed more trouble with blurring of vision, and has an appointment with eye doctor scheduled.   Physical Exam:   Appearance - well kempt   ENMT - clear nasal mucosa, midline nasal  septum, no oral exudates, no LAN, trachea midline  Respiratory - normal chest wall, normal respiratory effort, no accessory muscle use, no wheeze/rales  CV - s1s2 regular rate and rhythm, no murmurs, no peripheral edema, radial pulses symmetric  GI - soft, non tender, no masses  Lymph - no adenopathy noted in neck and axillary areas  MSK - normal gait  Ext - no cyanosis, clubbing, or joint inflammation noted  Skin - no rashes, lesions, or ulcers  Neuro - normal strength, oriented x 3  Psych - normal mood and affect   Assessment/Plan:   Sarcoidosis. - pulmonary disease seems stable at present; continue clinical monitoring for now - she reports increased joint discomfort; will refer to rheumatology to further assess - she has appointment with eye doctor scheduled to assess blurred vision - she can continue prn benzonatate  Patient Instructions  Will arrange for referral to rheumatology to assess joint involvement from sarcoidosis  Follow up in 6 months  A  total of  24 minutes were spent face to face and non face to face with the patient and more than half of that time involved counseling or coordination of care.   Chesley Mires, MD [facility] Pager: [phone] 08/07/2019, 2:29 PM  Flow Sheet     Pulmonary tests:  PFT 09/23/17 >> FEV1 1.94 (81%), FEV1% 89, TLC 3.69 (76%), DLCO 74% EBUS 09/28/17 >> granulomas Quantiferon gold 11/23/17 >> negative  Sleep tests:  CT chest 09/13/17 >> bulky mediastinal and hilar LAN, 10 mm nodule Rt perihilar region, 6 mm nodule Rt major fissure CT chest 12/02/17 >> stable findings of sarcoidosis  Medications:   Allergies as of 08/07/2019      Reactions   Penicillins Rash, Other (See Comments)   PATIENT HAS HAD A PCN REACTION WITH IMMEDIATE RASH, FACIAL/TONGUE/THROAT SWELLING, SOB, OR LIGHTHEADEDNESS WITH HYPOTENSION:  #  #  #  YES  #  #  #   Has patient had a PCN reaction causing severe rash involving mucus membranes or skin necrosis: no Has patient had a PCN reaction that required hospitalization: no Has patient had a PCN reaction occurring within the last 10 years: no   Influenza Vaccines Other (See Comments)   UNSPECIFIED ILLNESS   Prednisone Other (See Comments)   Causes open sores       Medication List       Accurate as of August 07, 2019 11:59 PM.  If you have any questions, ask your nurse or doctor.        STOP taking these medications   dexlansoprazole 60 MG capsule Commonly known as: Dexilant Stopped by: Chesley Mires, MD   methylPREDNISolone 16 MG tablet Commonly known as: MEDROL Stopped by: Chesley Mires, MD     TAKE these medications   benzonatate 100 MG capsule Commonly known as: TESSALON Take 1 capsule (100 mg total) by mouth 3 (three) times daily. What changed: Another medication with the same name was removed. Continue taking this medication, and follow the directions you see here. Changed by: Chesley Mires, MD   levonorgestrel 20 MCG/24HR IUD Commonly  known as: MIRENA 1 each by Intrauterine route once.   ranitidine 150 MG tablet Commonly known as: ZANTAC Take 300 mg by mouth 2 (two) times daily.   sodium chloride 0.65 % Soln nasal spray Commonly known as: OCEAN Place 2 sprays into both nostrils as needed for congestion.   NasoGel Gel Place 1 spray into both nostrils daily as needed (for moisture).       Past Surgical History:  She  has a past surgical history that includes Cesarean section; Breast surgery; Breast excisional biopsy (Right); Breast excisional biopsy (Left); Hernia repair; Wisdom tooth extraction; Esophagogastroduodenoscopy (05/2017); Cholecystectomy (N/A, 06/28/2017); and Video bronchoscopy with endobronchial ultrasound (N/A, 09/28/2017).  Family History:  Her family history includes Asthma in her mother; Cancer in her maternal grandfather, maternal grandmother, and paternal grandmother; Diabetes in her mother; Heart murmur in her mother; Kidney disease in her paternal grandmother; Sleep apnea in her mother.  Social History:  She  reports that she has never smoked. She has never used smokeless tobacco. She reports current alcohol use of about 4.0 standard drinks of alcohol per week. She reports that she does not use drugs.

## 2019-08-08 ENCOUNTER — Ambulatory Visit (INDEPENDENT_AMBULATORY_CARE_PROVIDER_SITE_OTHER): Payer: BC Managed Care – PPO | Admitting: Podiatry

## 2019-08-08 ENCOUNTER — Telehealth: Payer: Self-pay | Admitting: Pulmonary Disease

## 2019-08-08 ENCOUNTER — Encounter: Payer: Self-pay | Admitting: Podiatry

## 2019-08-08 VITALS — BP 119/79 | HR 65

## 2019-08-08 DIAGNOSIS — L603 Nail dystrophy: Secondary | ICD-10-CM | POA: Diagnosis not present

## 2019-08-08 NOTE — Telephone Encounter (Signed)
Ov note was faxed to them at the number requested

## 2019-08-09 NOTE — Progress Notes (Signed)
Subjective:  Patient ID: Sandra [name], female    DOB: 12/05/1975,  MRN: [id number] HPI Chief Complaint  Patient presents with  . Eczema    left foot, lateral arch and great left toe dry, scaly,peeling skin    44 y.o. female presents with the above complaint.   ROS: Denies fever chills nausea vomiting muscle aches pains calf pain back pain chest pain shortness of breath.  Past Medical History:  Diagnosis Date  . Anemia   . Arthritis    knees, hips  . Complication of anesthesia   . GERD (gastroesophageal reflux disease)   . Headache   . OAB (overactive bladder)   . PONV (postoperative nausea and vomiting)    "patch works well."  . Sarcoidosis 2019   Dr. Coralyn Helling   Past Surgical History:  Procedure Laterality Date  . BREAST EXCISIONAL BIOPSY Right    2015  . BREAST EXCISIONAL BIOPSY Left    2007 Papilloma removed  . BREAST SURGERY     lt breast hematoma  . CESAREAN SECTION     x 2  . CHOLECYSTECTOMY N/A 06/28/2017   Procedure: LAPAROSCOPIC CHOLECYSTECTOMY;  Surgeon: Abigail Miyamoto, MD;  Location: [facility] OR;  Service: General;  Laterality: N/A;  . ESOPHAGOGASTRODUODENOSCOPY  05/2017   Dr. Loreta Ave  . HERNIA REPAIR     Umbilical Hernia  . VIDEO BRONCHOSCOPY WITH ENDOBRONCHIAL ULTRASOUND N/A 09/28/2017   Procedure: VIDEO BRONCHOSCOPY WITH ENDOBRONCHIAL ULTRASOUND;  Surgeon: Leslye Peer, MD;  Location: [facility] OR;  Service: Thoracic;  Laterality: N/A;  . WISDOM TOOTH EXTRACTION     2    Current Outpatient Medications:  .  benzonatate (TESSALON) 100 MG capsule, Take 1 capsule (100 mg total) by mouth 3 (three) times daily., Disp: 30 capsule, Rfl: 0 .  levonorgestrel (MIRENA) 20 MCG/24HR IUD, 1 each by Intrauterine route once., Disp: , Rfl:  .  ranitidine (ZANTAC) 150 MG tablet, Take 300 mg by mouth 2 (two) times daily., Disp: , Rfl:  .  Saline (NASOGEL) GEL, Place 1 spray into both nostrils daily as needed (for moisture)., Disp: , Rfl:  .  sodium chloride (OCEAN) 0.65 %  SOLN nasal spray, Place 2 sprays into both nostrils as needed for congestion., Disp: , Rfl:   Allergies  Allergen Reactions  . Penicillins Rash and Other (See Comments)    PATIENT HAS HAD A PCN REACTION WITH IMMEDIATE RASH, FACIAL/TONGUE/THROAT SWELLING, SOB, OR LIGHTHEADEDNESS WITH HYPOTENSION:  #  #  #  YES  #  #  #   Has patient had a PCN reaction causing severe rash involving mucus membranes or skin necrosis: no Has patient had a PCN reaction that required hospitalization: no Has patient had a PCN reaction occurring within the last 10 years: no   . Influenza Vaccines Other (See Comments)    UNSPECIFIED ILLNESS  . Prednisone Other (See Comments)    Causes open sores    Review of Systems Objective:   Vitals:   08/08/19 0950  BP: 119/79  Pulse: 65    General: Well developed, nourished, in no acute distress, alert and oriented x3   Dermatological: Skin is warm, dry and supple bilateral. Nails x 10 are well maintained; remaining integument appears unremarkable at this time. There are no open sores, no preulcerative lesions, no rash or signs of infection present.  Hallux nail left is thickened discolored with subungual debris.  Vascular: Dorsalis Pedis artery and Posterior Tibial artery pedal pulses are 2/4 bilateral with immedate capillary fill time.  Pedal hair growth present. No varicosities and no lower extremity edema present bilateral.   Neruologic: Grossly intact via light touch bilateral. Vibratory intact via tuning fork bilateral. Protective threshold with Semmes Wienstein monofilament intact to all pedal sites bilateral. Patellar and Achilles deep tendon reflexes 2+ bilateral. No Babinski or clonus noted bilateral.   Musculoskeletal: No gross boney pedal deformities bilateral. No pain, crepitus, or limitation noted with foot and ankle range of motion bilateral. Muscular strength 5/5 in all groups tested bilateral.  Gait: Unassisted, Nonantalgic.    Radiographs:  None  taken  Assessment & Plan:   Assessment: Nail dystrophy with possible tinea pedis and onychomycosis.  Plan: Samples of the toenail were taken today to be sent for pathologic evaluation and follow-up with her in 1 month.     Shantice Menger T. [place], [place]

## 2019-08-10 ENCOUNTER — Telehealth: Payer: Self-pay | Admitting: Pulmonary Disease

## 2019-08-10 NOTE — Telephone Encounter (Signed)
Spoke with Omega from Dr. Audelia Hives office. Per Omega, pt was scheduled to see Dr. Dossie Der but Dr. Dossie Der was still needing results of PFT, CTs, labwork, and ENB results as they had not been received yet.  I stated to Omega that I would fax all this to them and she verbalized understanding. All test results have been sent to [facility] at provided fax number. Nothing further needed.

## 2019-08-22 ENCOUNTER — Telehealth: Payer: Self-pay | Admitting: *Deleted

## 2019-08-22 ENCOUNTER — Ambulatory Visit: Payer: BC Managed Care – PPO | Attending: Internal Medicine

## 2019-08-22 DIAGNOSIS — Z20822 Contact with and (suspected) exposure to covid-19: Secondary | ICD-10-CM

## 2019-08-22 NOTE — Telephone Encounter (Signed)
Unable to leave a message pt's voicemail box was full. Mailed note to pt informing of results and request to make an appt.

## 2019-08-22 NOTE — Telephone Encounter (Signed)
Dr. Milinda Pointer reviewed pt's [place] fungal culture results as positive for fungus and requested pt make an appt to discuss results and treatment.

## 2019-08-22 NOTE — Telephone Encounter (Signed)
Pt called and Myles Rosenthal - scheduler informed that Dr. Milinda Pointer wanted her to make an appointment to discuss results and treatment. I did not mail the letter.

## 2019-08-23 LAB — NOVEL CORONAVIRUS, NAA: SARS-CoV-2, NAA: DETECTED — AB

## 2019-08-24 ENCOUNTER — Telehealth: Payer: Self-pay | Admitting: Nurse Practitioner

## 2019-08-24 NOTE — Telephone Encounter (Signed)
Called to Discuss with patient about Covid symptoms and the use of bamlanivimab, a monoclonal antibody infusion for those with mild to moderate Covid symptoms and at a high risk of hospitalization.     Pt states that symptoms started over 14 days ago. States that she is better now.

## 2019-08-30 ENCOUNTER — Other Ambulatory Visit: Payer: BC Managed Care – PPO

## 2019-09-05 ENCOUNTER — Encounter: Payer: Self-pay | Admitting: Podiatry

## 2019-09-05 ENCOUNTER — Ambulatory Visit (INDEPENDENT_AMBULATORY_CARE_PROVIDER_SITE_OTHER): Payer: BC Managed Care – PPO | Admitting: Podiatry

## 2019-09-05 ENCOUNTER — Other Ambulatory Visit: Payer: Self-pay

## 2019-09-05 DIAGNOSIS — L603 Nail dystrophy: Secondary | ICD-10-CM

## 2019-09-05 MED ORDER — TERBINAFINE HCL 250 MG PO TABS: 250.0000 mg | ORAL_TABLET | Freq: Every day | ORAL | 0 refills | Status: DC

## 2019-09-05 NOTE — Progress Notes (Signed)
She presents today just recovering from COVID-19 for follow-up of her pathology report for onychomycosis.  Objective: Vital signs are stable she alert oriented x3 no change in physical examination.  Retains tinea pedis and onychomycosis pathology report does demonstrate Trichophyton rubrum.  Assessment: Onychomycosis tinea pedis.  Plan: Discussed oral therapy topical therapy laser therapy at this point she would like to consider oral therapy.  She already has had recent liver enzymes drawn in January of this year so we will except those as normal enzymes and baseline.  We will go ahead and start her on 30 days worth of Lamisil 1 tablet p.o. daily we discussed the pros and cons of the oral therapy discussed the possible side effects she understands and is amenable to it we will follow-up with me in 1 month for another liver profile and a prescription for 90 tablets.

## 2019-09-05 NOTE — Patient Instructions (Signed)
Terbinafine tablets What is this medicine? TERBINAFINE (TER bin a feen) is an antifungal medicine. It is used to treat certain kinds of fungal or yeast infections. This medicine may be used for other purposes; ask your health care provider or pharmacist if you have questions. COMMON BRAND NAME(S): Lamisil, Terbinex What should I tell my health care provider before I take this medicine? They need to know if you have any of these conditions:  drink alcoholic beverages  kidney disease  liver disease  an unusual or allergic reaction to terbinafine, other medicines, foods, dyes, or preservatives  pregnant or trying to get pregnant  breast-feeding How should I use this medicine? Take this medicine by mouth with a full glass of water. Follow the directions on the prescription label. You can take this medicine with food or on an empty stomach. Take your medicine at regular intervals. Do not take your medicine more often than directed. Do not skip doses or stop your medicine early even if you feel better. Do not stop taking except on your doctor's advice. Talk to your pediatrician regarding the use of this medicine in children. Special care may be needed. Overdosage: If you think you have taken too much of this medicine contact a poison control center or emergency room at once. NOTE: This medicine is only for you. Do not share this medicine with others. What if I miss a dose? If you miss a dose, take it as soon as you can. If it is almost time for your next dose, take only that dose. Do not take double or extra doses. What may interact with this medicine? Do not take this medicine with any of the following medications:  thioridazine This medicine may also interact with the following medications:  beta-blockers  caffeine  cimetidine  cyclosporine  medicines for depression, anxiety, or psychotic disturbances  medicines for fungal infections like fluconazole and ketoconazole  medicines  for irregular heartbeat like amiodarone, flecainide and propafenone  rifampin  warfarin This list may not describe all possible interactions. Give your health care provider a list of all the medicines, herbs, non-prescription drugs, or dietary supplements you use. Also tell them if you smoke, drink alcohol, or use illegal drugs. Some items may interact with your medicine. What should I watch for while using this medicine? Visit your doctor or health care provider regularly. Tell your doctor right away if you have nausea or vomiting, loss of appetite, stomach pain on your right upper side, yellow skin, dark urine, light stools, or are over tired. Some fungal infections need many weeks or months of treatment to cure. If you are taking this medicine for a long time, you will need to have important blood work done. This medicine may cause serious skin reactions. They can happen weeks to months after starting the medicine. Contact your health care provider right away if you notice fevers or flu-like symptoms with a rash. The rash may be red or purple and then turn into blisters or peeling of the skin. Or, you might notice a red rash with swelling of the face, lips or lymph nodes in your neck or under your arms. What side effects may I notice from receiving this medicine? Side effects that you should report to your doctor or health care professional as soon as possible:  allergic reactions like skin rash or hives, swelling of the face, lips, or tongue  changes in vision  dark urine  fever or infection  general ill feeling or flu-like symptoms    light-colored stools  loss of appetite, nausea  rash, fever, and swollen lymph nodes  redness, blistering, peeling or loosening of the skin, including inside the mouth  right upper belly pain  unusually weak or tired  yellowing of the eyes or skin Side effects that usually do not require medical attention (report to your doctor or health care  professional if they continue or are bothersome):  changes in taste  diarrhea  hair loss  muscle or joint pain  stomach gas  stomach upset This list may not describe all possible side effects. Call your doctor for medical advice about side effects. You may report side effects to FDA at 1-800-FDA-1088. Where should I keep my medicine? Keep out of the reach of children. Store at room temperature below 25 degrees C (77 degrees F). Protect from light. Throw away any unused medicine after the expiration date. NOTE: This sheet is a summary. It may not cover all possible information. If you have questions about this medicine, talk to your doctor, pharmacist, or health care provider.  2020 Elsevier/Gold Standard (2018-10-21 15:37:07)  

## 2019-10-10 ENCOUNTER — Ambulatory Visit (INDEPENDENT_AMBULATORY_CARE_PROVIDER_SITE_OTHER): Payer: BC Managed Care – PPO | Admitting: Podiatry

## 2019-10-10 ENCOUNTER — Other Ambulatory Visit: Payer: Self-pay

## 2019-10-10 ENCOUNTER — Encounter: Payer: Self-pay | Admitting: Podiatry

## 2019-10-10 DIAGNOSIS — L603 Nail dystrophy: Secondary | ICD-10-CM

## 2019-10-10 DIAGNOSIS — Z79899 Other long term (current) drug therapy: Secondary | ICD-10-CM | POA: Diagnosis not present

## 2019-10-10 MED ORDER — TERBINAFINE HCL 250 MG PO TABS: 250.0000 mg | ORAL_TABLET | Freq: Every day | ORAL | 0 refills | Status: DC

## 2019-10-10 NOTE — Progress Notes (Signed)
She presents today for follow-up of her nail fungus and Lamisil therapy.  Did stated that she did not have any problems taking her Lamisil therapy denies fever chills nausea vomiting muscle aches pains itching or rashes.  Objective: Vital signs are stable alert and oriented x3.  No change in physical exam.  Assessment: Onychomycosis.  Long-term therapy with Lamisil.  Plan: At this point work we are requesting another liver profile and I will write a prescription for another 90 days of Lamisil.  Follow-up with her in 4 months we will call with results of the blood work.

## 2019-10-11 LAB — HEPATIC FUNCTION PANEL
AG Ratio: 1.7 (calc) (ref 1.0–2.5)
ALT: 12 U/L (ref 6–29)
AST: 12 U/L (ref 10–30)
Albumin: 4.2 g/dL (ref 3.6–5.1)
Alkaline phosphatase (APISO): 50 U/L (ref 31–125)
Bilirubin, Direct: 0.1 mg/dL (ref 0.0–0.2)
Globulin: 2.5 g/dL (calc) (ref 1.9–3.7)
Indirect Bilirubin: 0.2 mg/dL (calc) (ref 0.2–1.2)
Total Bilirubin: 0.3 mg/dL (ref 0.2–1.2)
Total Protein: 6.7 g/dL (ref 6.1–8.1)

## 2019-10-12 ENCOUNTER — Telehealth: Payer: Self-pay | Admitting: *Deleted

## 2019-10-12 ENCOUNTER — Other Ambulatory Visit: Payer: Self-pay | Admitting: Internal Medicine

## 2019-10-12 DIAGNOSIS — Z111 Encounter for screening for respiratory tuberculosis: Secondary | ICD-10-CM

## 2019-10-12 NOTE — Telephone Encounter (Signed)
I informed pt of Dr. Hyatt's review of results and orders. 

## 2019-10-12 NOTE — Telephone Encounter (Signed)
-----   Message from Garrel Ridgel, [place] sent at 10/11/2019 12:52 PM EDT ----- Blood work looks perfect may continue medication.

## 2019-10-13 ENCOUNTER — Other Ambulatory Visit: Payer: BC Managed Care – PPO

## 2019-10-13 ENCOUNTER — Other Ambulatory Visit: Payer: Self-pay

## 2019-10-13 DIAGNOSIS — Z111 Encounter for screening for respiratory tuberculosis: Secondary | ICD-10-CM

## 2019-10-15 LAB — QUANTIFERON-TB GOLD PLUS
QuantiFERON Mitogen Value: >10 IU/mL
QuantiFERON Nil Value: 0.04 IU/mL
QuantiFERON TB1 Ag Value: 0.06 IU/mL
QuantiFERON TB2 Ag Value: 0.04 IU/mL
QuantiFERON-TB Gold Plus: NEGATIVE

## 2020-02-07 ENCOUNTER — Ambulatory Visit: Payer: BC Managed Care – PPO | Admitting: Obstetrics and Gynecology

## 2020-02-13 ENCOUNTER — Ambulatory Visit: Payer: BC Managed Care – PPO | Admitting: Podiatry

## 2020-02-29 ENCOUNTER — Ambulatory Visit: Payer: Self-pay | Admitting: Obstetrics and Gynecology

## 2020-05-26 ENCOUNTER — Emergency Department (HOSPITAL_BASED_OUTPATIENT_CLINIC_OR_DEPARTMENT_OTHER): Payer: Self-pay

## 2020-05-26 ENCOUNTER — Encounter (HOSPITAL_BASED_OUTPATIENT_CLINIC_OR_DEPARTMENT_OTHER): Payer: Self-pay | Admitting: Emergency Medicine

## 2020-05-26 ENCOUNTER — Emergency Department (HOSPITAL_BASED_OUTPATIENT_CLINIC_OR_DEPARTMENT_OTHER)
Admission: EM | Admit: 2020-05-26 | Discharge: 2020-05-27 | Disposition: A | Discharge status: Home / Self Care | Payer: Self-pay | Attending: Emergency Medicine | Admitting: Emergency Medicine

## 2020-05-26 DIAGNOSIS — R109 Unspecified abdominal pain: Secondary | ICD-10-CM | POA: Insufficient documentation

## 2020-05-26 DIAGNOSIS — Z7952 Long term (current) use of systemic steroids: Secondary | ICD-10-CM | POA: Insufficient documentation

## 2020-05-26 DIAGNOSIS — K219 Gastro-esophageal reflux disease without esophagitis: Secondary | ICD-10-CM | POA: Insufficient documentation

## 2020-05-26 LAB — CBC WITH DIFFERENTIAL/PLATELET
Abs Immature Granulocytes: 0.01 10*3/uL (ref 0.00–0.07)
Basophils Absolute: 0 10*3/uL (ref 0.0–0.1)
Basophils Relative: 1 %
Eosinophils Absolute: 0.2 10*3/uL (ref 0.0–0.5)
Eosinophils Relative: 4 %
HCT: 38.3 % (ref 36.0–46.0)
Hemoglobin: 12.5 g/dL (ref 12.0–15.0)
Immature Granulocytes: 0 %
Lymphocytes Relative: 31 %
Lymphs Abs: 1.5 10*3/uL (ref 0.7–4.0)
MCH: 29.7 pg (ref 26.0–34.0)
MCHC: 32.6 g/dL (ref 30.0–36.0)
MCV: 91 fL (ref 80.0–100.0)
Monocytes Absolute: 0.5 10*3/uL (ref 0.1–1.0)
Monocytes Relative: 9 %
Neutro Abs: 2.6 10*3/uL (ref 1.7–7.7)
Neutrophils Relative %: 55 %
Platelets: 236 10*3/uL (ref 150–400)
RBC: 4.21 MIL/uL (ref 3.87–5.11)
RDW: 13 % (ref 11.5–15.5)
WBC: 4.8 10*3/uL (ref 4.0–10.5)
nRBC: 0 % (ref 0.0–0.2)

## 2020-05-26 LAB — URINALYSIS, ROUTINE W REFLEX MICROSCOPIC
Bilirubin Urine: NEGATIVE
Glucose, UA: NEGATIVE mg/dL
Hgb urine dipstick: NEGATIVE
Ketones, ur: NEGATIVE mg/dL
Leukocytes,Ua: NEGATIVE
Nitrite: NEGATIVE
Protein, ur: NEGATIVE mg/dL
Specific Gravity, Urine: 1.025 (ref 1.005–1.030)
pH: 6.5 (ref 5.0–8.0)

## 2020-05-26 LAB — COMPREHENSIVE METABOLIC PANEL
ALT: 18 U/L (ref 0–44)
AST: 18 U/L (ref 15–41)
Albumin: 4.1 g/dL (ref 3.5–5.0)
Alkaline Phosphatase: 45 U/L (ref 38–126)
Anion gap: 9 (ref 5–15)
BUN: 17 mg/dL (ref 6–20)
CO2: 25 mmol/L (ref 22–32)
Calcium: 8.9 mg/dL (ref 8.9–10.3)
Chloride: 102 mmol/L (ref 98–111)
Creatinine, Ser: 0.66 mg/dL (ref 0.44–1.00)
GFR, Estimated: >60 mL/min (ref >60)
Glucose, Bld: 88 mg/dL (ref 70–99)
Potassium: 3.9 mmol/L (ref 3.5–5.1)
Sodium: 136 mmol/L (ref 135–145)
Total Bilirubin: 0.6 mg/dL (ref 0.3–1.2)
Total Protein: 7.2 g/dL (ref 6.5–8.1)

## 2020-05-26 LAB — PREGNANCY, URINE: Preg Test, Ur: NEGATIVE

## 2020-05-26 LAB — LIPASE, BLOOD: Lipase: 35 U/L (ref 11–51)

## 2020-05-26 MED ORDER — METHOCARBAMOL 500 MG PO TABS: 500.0000 mg | ORAL_TABLET | Freq: Three times a day (TID) | ORAL | 0 refills | Status: DC | PRN

## 2020-05-26 MED ORDER — ONDANSETRON HCL 4 MG/2ML IJ SOLN
4.0000 mg | Freq: Once | INTRAMUSCULAR | Status: AC
  Administered 2020-05-26: 4 mg via INTRAVENOUS
  Filled 2020-05-26: qty 2

## 2020-05-26 MED ORDER — MORPHINE SULFATE (PF) 4 MG/ML IV SOLN
4.0000 mg | Freq: Once | INTRAVENOUS | Status: AC
  Administered 2020-05-26: 4 mg via INTRAVENOUS
  Filled 2020-05-26: qty 1

## 2020-05-26 NOTE — ED Triage Notes (Signed)
R flank pain since this morning.

## 2020-05-26 NOTE — Discharge Instructions (Addendum)
As we discussed today your lab work and CAT scan were both very reassuring without cause for your symptoms found.  We discussed that you may have muscle spasms and muscle related pain due to the painting you did yesterday.  Another possible cause would be that you have shingles and as we discussed you can have pain for a few days before rash shows up.  Please schedule a follow-up appointment with your primary care doctor.  If you develop fevers, shortness of breath, vomiting, your pain is uncontrolled or unbearable, you have difficulty urinating, numbness in your legs especially in your upper inner legs or across your genitals, or have any other complaints or concerns please seek additional medical care and evaluation.  Today you received medications that may make you sleepy or impair your ability to make decisions.  For the next 24 hours please do not drive, operate heavy machinery, care for a small child with out another adult present, or perform any activities that may cause harm to you or someone else if you were to fall asleep or be impaired.   You are being prescribed a medication which may make you sleepy. Please follow up of listed precautions for at least 24 hours after taking one dose.  Please take Ibuprofen (Advil, motrin) and Tylenol (acetaminophen) to relieve your pain.  You may take up to 600 MG (3 pills) of normal strength ibuprofen every 8 hours as needed.  In between doses of ibuprofen you make take tylenol, up to 1,000 mg (two extra strength pills).  Do not take more than 3,000 mg tylenol in a 24 hour period.  Please check all medication labels as many medications such as pain and cold medications may contain tylenol.  Do not drink alcohol while taking these medications.  Do not take other NSAID'S while taking ibuprofen (such as aleve or naproxen).  Please take ibuprofen with food to decrease stomach upset.  The best way to get rid of muscle pain is by taking NSAIDS, using heat, massage  therapy, and gentle stretching/range of motion exercises.

## 2020-05-26 NOTE — ED Provider Notes (Signed)
[facility] EMERGENCY DEPARTMENT Provider Note   CSN: [id number] Arrival date & time: 05/26/20  1834     History Chief Complaint  Patient presents with  . Flank Pain    Sandra [name] is a 44 y.o. female with past medical history of sarcoidosis, GERD, anemia, cough, who presents today for evaluation of right-sided flank pain.  She reports that she had a kidney stone about 10 years ago and this feels similar.  The pain started suddenly while she was cooking breakfast this morning at about 9.  She reports the pain waxes and wanes however is always there.  She has been unable to do anything today due to the pain.  She denies any fevers.  No dysuria, frequency or urgency.  She denies any frank hematuria.  No pelvic pain or discharge.  The right-sided flank pain wraps around into her right lower quadrant.  She no longer has a gallbladder however still has her appendix.  She reports that she was supposed to get her IUD removed "a while ago" however they did not see the strings.  She does report that she was sitting down and painting an object in front of her yesterday.  This is new and different for her.  She denies any numbness or tingling in her legs, no difficulty urinating.  HPI     Past Medical History:  Diagnosis Date  . Anemia   . Arthritis    knees, hips  . Complication of anesthesia   . GERD (gastroesophageal reflux disease)   . Headache   . OAB (overactive bladder)   . PONV (postoperative nausea and vomiting)    "patch works well."  . Sarcoidosis 2019   Dr. Chesley Mires    Patient Active Problem List   Diagnosis Date Noted  . Need for Tdap vaccination 07/31/2019  . IUD threads lost 07/31/2019  . Other nonspecific abnormal finding of lung field 07/31/2019  . Pain of left lower extremity 12/13/2017  . Long term systemic steroid user 12/13/2017  . Contusion of left lower leg 12/13/2017  . Sarcoidosis 10/11/2017  . Esophageal reflux 10/11/2017  . Mediastinal  lymphadenopathy   . Cough 09/23/2017  . Headache syndrome 09/02/2017  . Dizziness 09/02/2017  . SOB (shortness of breath) 09/01/2017  . Screen for STD (sexually transmitted disease) 09/01/2017  . Screening for cervical cancer 09/01/2017  . Cyst of breast 09/01/2017  . Vaccine counseling 09/01/2017  . Influenza vaccination declined 09/01/2017  . Encounter for health maintenance examination in adult 09/01/2017  . Abnormal chest x-ray 08/25/2017  . OAB (overactive bladder) 08/25/2017  . Witnessed apneic spells 08/25/2017  . Snoring 08/25/2017  . Daytime somnolence 08/25/2017  . Other fatigue 08/25/2017  . Uterus, adenomyosis 01/31/2015  . Dysmenorrhea 01/31/2015    Past Surgical History:  Procedure Laterality Date  . BREAST EXCISIONAL BIOPSY Right    2015  . BREAST EXCISIONAL BIOPSY Left    2007 Papilloma removed  . BREAST SURGERY     lt breast hematoma  . CESAREAN SECTION     x 2  . CHOLECYSTECTOMY N/A 06/28/2017   Procedure: LAPAROSCOPIC CHOLECYSTECTOMY;  Surgeon: Coralie Keens, MD;  Location: [facility];  Service: General;  Laterality: N/A;  . ESOPHAGOGASTRODUODENOSCOPY  05/2017   Dr. Collene Mares  . HERNIA REPAIR     Umbilical Hernia  . VIDEO BRONCHOSCOPY WITH ENDOBRONCHIAL ULTRASOUND N/A 09/28/2017   Procedure: VIDEO BRONCHOSCOPY WITH ENDOBRONCHIAL ULTRASOUND;  Surgeon: Collene Gobble, MD;  Location: [facility];  Service: Thoracic;  Laterality: N/A;  . WISDOM TOOTH EXTRACTION     2     OB History    Gravida  2   Para  2   Term  2   Preterm      AB      Living        SAB      TAB      Ectopic      Multiple      Live Births              Family History  Problem Relation Age of Onset  . Asthma Mother   . Heart murmur Mother   . Diabetes Mother   . Sleep apnea Mother   . Cancer Maternal Grandmother        stomach  . Cancer Maternal Grandfather        prostate  . Cancer Paternal Grandmother        breast  . Kidney disease Paternal Grandmother         dialysis  . Sarcoidosis Neg Hx   . Heart disease Neg Hx   . Stroke Neg Hx   . Breast cancer Neg Hx     Social History   Tobacco Use  . Smoking status: Never Smoker  . Smokeless tobacco: Never Used  Vaping Use  . Vaping Use: Never used  Substance Use Topics  . Alcohol use: Yes    Alcohol/week: 4.0 standard drinks    Types: 2 Glasses of wine, 2 Shots of liquor per week    Comment: ocasional  . Drug use: No    Home Medications Prior to Admission medications   Medication Sig Start Date End Date Taking? Authorizing Provider  benzonatate (TESSALON) 100 MG capsule Take 1 capsule (100 mg total) by mouth 3 (three) times daily. 12/13/17   Tysinger, Camelia Eng, PA-C  levonorgestrel (MIRENA) 20 MCG/24HR IUD 1 each by Intrauterine route once.    [provider]  LOTEMAX SM 0.38 % GEL Apply 1 drop to eye 2 (two) times daily. 08/28/19   [provider]  methocarbamol (ROBAXIN) 500 MG tablet Take 1 tablet (500 mg total) by mouth every 8 (eight) hours as needed for muscle spasms. 05/26/20   Lorin Glass, PA-C  ranitidine (ZANTAC) 150 MG tablet Take 300 mg by mouth 2 (two) times daily.    [provider]  Saline (NASOGEL) GEL Place 1 spray into both nostrils daily as needed (for moisture).    [provider]  sodium chloride (OCEAN) 0.65 % SOLN nasal spray Place 2 sprays into both nostrils as needed for congestion.    [provider]  terbinafine (LAMISIL) 250 MG tablet Take 1 tablet (250 mg total) by mouth daily. 10/10/19   Hyatt, Max T, DPM    Allergies    Penicillins, Influenza vaccines, and Prednisone  Review of Systems   Review of Systems  Constitutional: Negative for chills and fever.  HENT: Negative for congestion.   Respiratory: Negative for cough and shortness of breath.   Cardiovascular: Negative for chest pain.  Gastrointestinal: Positive for abdominal pain and nausea. Negative for diarrhea and vomiting.  Genitourinary: Positive for  flank pain. Negative for dysuria, menstrual problem, pelvic pain and urgency.  Musculoskeletal: Negative for back pain and neck pain.  Neurological: Negative for weakness.  All other systems reviewed and are negative.   Physical Exam Updated Vital Signs BP 118/77 (BP Location: Left Arm)   Pulse 67   Temp 98.2  F (36.8 C) (Oral)   Resp 16   Ht 5\' 2"  (1.575 m)   Wt 90.7 kg   SpO2 100%   BMI 36.58 kg/m   Physical Exam Vitals and nursing note reviewed.  Constitutional:      Appearance: She is well-developed.     Comments: Appears uncomfortable  HENT:     Head: Normocephalic and atraumatic.     Right Ear: External ear normal.     Left Ear: External ear normal.     Nose: Nose normal.  Eyes:     General: No scleral icterus.       Right eye: No discharge.        Left eye: No discharge.     Conjunctiva/sclera: Conjunctivae normal.     Pupils: Pupils are equal, round, and reactive to light.  Neck:     Trachea: No tracheal deviation.  Cardiovascular:     Rate and Rhythm: Normal rate and regular rhythm.     Heart sounds: Normal heart sounds. No murmur heard.  No friction rub. No gallop.   Pulmonary:     Effort: Pulmonary effort is normal. No respiratory distress.     Breath sounds: Normal breath sounds. No stridor. No wheezing.  Abdominal:     General: Bowel sounds are normal. There is no distension.     Palpations: Abdomen is soft.     Tenderness: There is no abdominal tenderness. There is right CVA tenderness. There is no left CVA tenderness or guarding.  Musculoskeletal:        General: No deformity.     Cervical back: Normal range of motion and neck supple.     Right lower leg: No edema.     Left lower leg: No edema.     Comments: There is diffuse tenderness to palpation along the right sided lumbar paraspinal muscles.  Palpation here exacerbates her reported pain.  Skin:    General: Skin is warm and dry.     Comments: There is no rash or ecchymosis, no skin  discoloration or wounds visualized over the area of pain in the right flank around to the right sided abdomen.  Neurological:     Mental Status: She is alert.     Cranial Nerves: No cranial nerve deficit.     Sensory: No sensory deficit.     Motor: No abnormal muscle tone.  Psychiatric:        Mood and Affect: Mood normal.        Behavior: Behavior normal.     ED Results / Procedures / Treatments   Labs (all labs ordered are listed, but only abnormal results are displayed) Labs Reviewed  PREGNANCY, URINE  URINALYSIS, ROUTINE W REFLEX MICROSCOPIC  COMPREHENSIVE METABOLIC PANEL  LIPASE, BLOOD  CBC WITH DIFFERENTIAL/PLATELET    EKG None  Radiology CT Renal Stone Study  Result Date: 05/26/2020 CLINICAL DATA:  Right-sided flank pain EXAM: CT ABDOMEN AND PELVIS WITHOUT CONTRAST TECHNIQUE: Multidetector CT imaging of the abdomen and pelvis was performed following the standard protocol without IV contrast. COMPARISON:  Dec 16, 2009 FINDINGS: Lower chest: The lung bases are clear. The heart size is normal. Hepatobiliary: The liver is normal. Status post cholecystectomy.There is no biliary ductal dilation. Pancreas: Normal contours without ductal dilatation. No peripancreatic fluid collection. Spleen: Unremarkable. Adrenals/Urinary Tract: --Adrenal glands: Unremarkable. --Right kidney/ureter: No hydronephrosis or radiopaque kidney stones. --Left kidney/ureter: No hydronephrosis or radiopaque kidney stones. --Urinary bladder: Unremarkable. Stomach/Bowel: --Stomach/Duodenum: No hiatal hernia or other gastric abnormality.  Normal duodenal course and caliber. --Small bowel: Unremarkable. --Colon: Unremarkable. --Appendix: Normal. Vascular/Lymphatic: Normal course and caliber of the major abdominal vessels. --No retroperitoneal lymphadenopathy. --No mesenteric lymphadenopathy. --No pelvic or inguinal lymphadenopathy. Reproductive: There is an IUD in place. Other: No ascites or free air. There is a fat  containing umbilical hernia. Superior to the umbilicus is an additional fat containing ventral wall hernia with an abdominal wall defect measuring 11 mm and hernia sac measuring approximately 2.7 cm. Musculoskeletal. No acute displaced fractures. IMPRESSION: 1. No acute abdominopelvic abnormality. Normal appendix. No radiopaque kidney stones. 2. Fat containing umbilical and ventral wall hernias. 3. Status post cholecystectomy. Electronically Signed   By: Constance Holster M.D.   On: 05/26/2020 19:40    Procedures Procedures (including critical care time)  Medications Ordered in ED Medications  ondansetron (ZOFRAN) injection 4 mg (4 mg Intravenous Given 05/26/20 1907)  morphine 4 MG/ML injection 4 mg (4 mg Intravenous Given 05/26/20 1908)    ED Course  I have reviewed the triage vital signs and the nursing notes.  Pertinent labs & imaging results that were available during my care of the patient were reviewed by me and considered in my medical decision making (see chart for details).    MDM Rules/Calculators/A&P                         Patient is a 44 year old woman who presents today for evaluation of right-sided flank pain since this morning.  On exam she does have right-sided CVA tenderness to percussion along with tenderness to palpation of the right-sided lumbar and lower thoracic paraspinal muscles.  Based on the degree of her pain and reporting this feels like prior kidney stones labs are obtained and reviewed.  CBC, CMP, lipase, urine are all unremarkable.  Pregnancy test is negative.  CT renal stone is obtained without evidence of ureterolithiasis, hydronephrosis, stranding or other cause for her pain visualized.  She did paint yesterday which is a different and abnormal activity for her.  I suspect her pain may be musculoskeletal related due to this different activity.  She does not have any rash however we did discuss the possibility of herpetic neuralgia prior to the onset of rash.   Ovarian torsion is considered, however given that the pain started much more superior in the right sided flank before wrapping around to the abdomen I doubt ovarian torsion.  Her pain is not consistent with expected pain for PID.  She was treated in the emergency room with IV morphine and Zofran, after which she reported improvement in her symptoms.  We discussed conservative care.  Recommended ibuprofen and Tylenol.  Additionally recommended close PCP follow-up.   Return precautions were discussed with patient who states their understanding.  At the time of discharge patient denied any unaddressed complaints or concerns.  Patient is agreeable for discharge home.  Note: Portions of this report may have been transcribed using voice recognition software. Every effort was made to ensure accuracy; however, inadvertent computerized transcription errors may be present   Final Clinical Impression(s) / ED Diagnoses Final diagnoses:  Right flank pain    Rx / DC Orders ED Discharge Orders         Ordered    methocarbamol (ROBAXIN) 500 MG tablet  Every 8 hours PRN        05/26/20 2047           Lorin Glass, PA-C 05/26/20 2248    Lennice Sites,  DO 05/26/20 2329

## 2020-05-27 NOTE — ED Notes (Addendum)
Pt d/c home by Deatra Canter, RN at 2100 on 05-26-20

## 2020-07-27 DIAGNOSIS — D242 Benign neoplasm of left breast: Secondary | ICD-10-CM

## 2020-07-27 HISTORY — DX: Benign neoplasm of left breast: D24.2

## 2020-07-31 ENCOUNTER — Encounter: Payer: BC Managed Care – PPO | Admitting: Medical

## 2020-08-01 ENCOUNTER — Encounter: Payer: Self-pay | Admitting: Medical

## 2020-12-09 ENCOUNTER — Ambulatory Visit: Payer: Self-pay | Admitting: Obstetrics and Gynecology

## 2020-12-27 ENCOUNTER — Other Ambulatory Visit: Payer: Self-pay

## 2020-12-27 ENCOUNTER — Ambulatory Visit: Payer: 59 | Admitting: Medical

## 2020-12-27 ENCOUNTER — Encounter: Payer: Self-pay | Admitting: Medical

## 2020-12-27 VITALS — BP 104/74 | HR 69 | Temp 97.2°F | Wt 212.4 lb

## 2020-12-27 DIAGNOSIS — Z975 Presence of (intrauterine) contraceptive device: Secondary | ICD-10-CM | POA: Diagnosis not present

## 2020-12-27 DIAGNOSIS — M2142 Flat foot [pes planus] (acquired), left foot: Secondary | ICD-10-CM

## 2020-12-27 DIAGNOSIS — M2141 Flat foot [pes planus] (acquired), right foot: Secondary | ICD-10-CM

## 2020-12-27 DIAGNOSIS — M2140 Flat foot [pes planus] (acquired), unspecified foot: Secondary | ICD-10-CM | POA: Insufficient documentation

## 2020-12-27 DIAGNOSIS — R0989 Other specified symptoms and signs involving the circulatory and respiratory systems: Secondary | ICD-10-CM | POA: Diagnosis not present

## 2020-12-27 DIAGNOSIS — M47816 Spondylosis without myelopathy or radiculopathy, lumbar region: Secondary | ICD-10-CM | POA: Diagnosis not present

## 2020-12-27 DIAGNOSIS — R202 Paresthesia of skin: Secondary | ICD-10-CM

## 2020-12-27 NOTE — Progress Notes (Signed)
Subjective:  Sandra [name] is a 45 y.o. female who presents for Chief Complaint  Patient presents with  . Numbness    And tingling bottom of both feet going up the back of both legs No pain started about two months ago      Here for complaint of numbness in both feet.  This has been going on for about 2 months.  Started on the right side but it gradually started on the left as well.  She does have a history of lumbar facet disease of her spine, prior injections.  Has seen Ortho in the past for the  She denies history of diabetes.  She does not drink heavy alcohol.  No alcohol in last 30 days.   she just recently started B12 supplement about a week ago  Denies injury, trauma.  No genital or saddle anesthesia, no incontinence.  She does have flat feet.  She has an IUD in place that has expired.  She sees gynecology next week for appointment to replace this.  No heavy periods otherwise.  She was not sure if her numbness was due to COVID.  She had COVID infection last year and lost taste and smell for period of time, but that came back gradually  No other aggravating or relieving factors.    No other c/o.  The following portions of the patient's history were reviewed and updated as appropriate: allergies, current medications, past family history, past medical history, past social history, past surgical history and problem list.  ROS Otherwise as in subjective above  Objective: BP 104/74   Pulse 69   Temp (!) 97.2 F (36.2 C)   Wt 212 lb 6.4 oz (96.3 kg)   SpO2 98%   BMI 38.85 kg/m   General appearance: alert, no distress, well developed, well nourished Neck: supple, no lymphadenopathy, no thyromegaly, no masses Heart: RRR, normal S1, S2, no murmurs Lungs: CTA bilaterally, no wheezes, rhonchi, or rales Pulses: 2+ radial pulses, 2+ pedal pulses, normal cap refill Ext: no edema Back nontender, flexion to about 100 degrees with mild pain, otherwise no swelling or  deformity Bilateral flat feet, otherwise legs nontender without deformity, no swelling or tenderness Somewhat decreased sensation bilateral feet of monofilament exam but strength and DTRs normal   Assessment: Encounter Diagnoses  Name Primary?  . Paresthesia of both feet Yes  . Pes planus of both feet   . Lumbar facet arthropathy   . Decreased pedal pulses      Plan: We will request copy of prior lumbar x-ray from orthopedics I reviewed the last orthopedic notes we have on file from 2019  We discussed possible causes of numbness in the feet.  She does have some flat feet and some mild symptoms of plantar fasciitis.  We discussed treatment for this including not going barefoot, wearing good supportive shoes with good arch supports, towel stretches and tennis ball massage.  Given the wide differential, labs as below.  I suspect either plantar fasciitis or lumbar facet arthropathy and radicular issue as possible causes  Follow-up with gynecology next week for replacement of IUD  Sandra [name] was seen today for numbness.  Diagnoses and all orders for this visit:  Paresthesia of both feet -     Vitamin B12 -     Comprehensive metabolic panel -     CBC -     TSH  Pes planus of both feet -     Vitamin B12 -     Comprehensive metabolic  panel -     CBC -     TSH  Lumbar facet arthropathy -     Vitamin B12 -     Comprehensive metabolic panel -     CBC -     TSH  Decreased pedal pulses -     Vitamin B12 -     Comprehensive metabolic panel -     CBC -     TSH    Follow up: Pending labs

## 2020-12-28 LAB — CBC
Hematocrit: 37.9 % (ref 34.0–46.6)
Hemoglobin: 12.5 g/dL (ref 11.1–15.9)
MCH: 29.2 pg (ref 26.6–33.0)
MCHC: 33 g/dL (ref 31.5–35.7)
MCV: 89 fL (ref 79–97)
Platelets: 299 10*3/uL (ref 150–450)
RBC: 4.28 x10E6/uL (ref 3.77–5.28)
RDW: 12.4 % (ref 11.7–15.4)
WBC: 3.8 10*3/uL (ref 3.4–10.8)

## 2020-12-28 LAB — COMPREHENSIVE METABOLIC PANEL
ALT: 14 IU/L (ref 0–32)
AST: 14 IU/L (ref 0–40)
Albumin/Globulin Ratio: 1.8 (ref 1.2–2.2)
Albumin: 4.6 g/dL (ref 3.8–4.8)
Alkaline Phosphatase: 56 IU/L (ref 44–121)
BUN/Creatinine Ratio: 15 (ref 9–23)
BUN: 11 mg/dL (ref 6–24)
Bilirubin Total: 0.4 mg/dL (ref 0.0–1.2)
CO2: 21 mmol/L (ref 20–29)
Calcium: 9.2 mg/dL (ref 8.7–10.2)
Chloride: 100 mmol/L (ref 96–106)
Creatinine, Ser: 0.75 mg/dL (ref 0.57–1.00)
Globulin, Total: 2.6 g/dL (ref 1.5–4.5)
Glucose: 84 mg/dL (ref 65–99)
Potassium: 4.3 mmol/L (ref 3.5–5.2)
Sodium: 136 mmol/L (ref 134–144)
Total Protein: 7.2 g/dL (ref 6.0–8.5)
eGFR: 101 mL/min/{1.73_m2} (ref >59)

## 2020-12-28 LAB — TSH: TSH: 1.17 u[IU]/mL (ref 0.450–4.500)

## 2020-12-28 LAB — VITAMIN B12: Vitamin B-12: 1344 pg/mL — ABNORMAL HIGH (ref 232–1245)

## 2020-12-30 ENCOUNTER — Other Ambulatory Visit: Payer: Self-pay | Admitting: Medical

## 2020-12-30 DIAGNOSIS — Z1231 Encounter for screening mammogram for malignant neoplasm of breast: Secondary | ICD-10-CM

## 2020-12-31 ENCOUNTER — Telehealth: Payer: Self-pay | Admitting: Medical

## 2020-12-31 DIAGNOSIS — M545 Low back pain, unspecified: Secondary | ICD-10-CM | POA: Diagnosis not present

## 2020-12-31 NOTE — Telephone Encounter (Signed)
[facility] records received

## 2021-01-01 ENCOUNTER — Other Ambulatory Visit: Payer: Self-pay

## 2021-01-01 ENCOUNTER — Ambulatory Visit
Admission: RE | Admit: 2021-01-01 | Discharge: 2021-01-01 | Disposition: A | Discharge status: Home / Self Care | Payer: Self-pay | Source: Ambulatory Visit | Attending: Medical | Admitting: Medical

## 2021-01-01 DIAGNOSIS — Z1231 Encounter for screening mammogram for malignant neoplasm of breast: Secondary | ICD-10-CM | POA: Diagnosis not present

## 2021-01-03 ENCOUNTER — Other Ambulatory Visit: Payer: Self-pay

## 2021-01-03 ENCOUNTER — Other Ambulatory Visit: Payer: Self-pay | Admitting: Medical

## 2021-01-03 DIAGNOSIS — M47816 Spondylosis without myelopathy or radiculopathy, lumbar region: Secondary | ICD-10-CM

## 2021-01-03 DIAGNOSIS — R928 Other abnormal and inconclusive findings on diagnostic imaging of breast: Secondary | ICD-10-CM

## 2021-01-03 DIAGNOSIS — M2142 Flat foot [pes planus] (acquired), left foot: Secondary | ICD-10-CM

## 2021-01-03 DIAGNOSIS — R202 Paresthesia of skin: Secondary | ICD-10-CM

## 2021-01-15 ENCOUNTER — Ambulatory Visit: Payer: 59 | Admitting: Obstetrics and Gynecology

## 2021-01-15 ENCOUNTER — Other Ambulatory Visit (HOSPITAL_COMMUNITY)
Admission: RE | Admit: 2021-01-15 | Discharge: 2021-01-15 | Disposition: A | Discharge status: Home / Self Care | Payer: 59 | Source: Ambulatory Visit | Attending: Obstetrics and Gynecology | Admitting: Obstetrics and Gynecology

## 2021-01-15 ENCOUNTER — Other Ambulatory Visit: Payer: Self-pay

## 2021-01-15 VITALS — BP 119/78 | HR 79 | Ht 62.0 in | Wt 213.1 lb

## 2021-01-15 DIAGNOSIS — Z113 Encounter for screening for infections with a predominantly sexual mode of transmission: Secondary | ICD-10-CM

## 2021-01-15 DIAGNOSIS — Z124 Encounter for screening for malignant neoplasm of cervix: Secondary | ICD-10-CM | POA: Insufficient documentation

## 2021-01-15 DIAGNOSIS — N946 Dysmenorrhea, unspecified: Secondary | ICD-10-CM

## 2021-01-15 DIAGNOSIS — Z975 Presence of (intrauterine) contraceptive device: Secondary | ICD-10-CM | POA: Diagnosis not present

## 2021-01-15 DIAGNOSIS — N800 Endometriosis of uterus: Secondary | ICD-10-CM

## 2021-01-15 DIAGNOSIS — N8003 Adenomyosis of the uterus: Secondary | ICD-10-CM

## 2021-01-15 DIAGNOSIS — T8332XD Displacement of intrauterine contraceptive device, subsequent encounter: Secondary | ICD-10-CM

## 2021-01-15 DIAGNOSIS — R69 Illness, unspecified: Secondary | ICD-10-CM | POA: Diagnosis not present

## 2021-01-15 LAB — RESULTS CONSOLE HPV: CHL HPV: NEGATIVE

## 2021-01-15 NOTE — Progress Notes (Signed)
Obstetrics and Gynecology New Patient Evaluation  Appointment Date: 01/15/2021  OBGYN Clinic: [facility]   Primary Care Provider: Carlena Hurl   Chief Complaint: new Mirena IUD  History of Present Illness: Sandra [name] is a 45 y.o. G2P2 (No LMP recorded. (Menstrual status: IUD).), seen for the above chief complaint. Her past medical history is significant for c/s x 2 and adenomyosis on u/s.  She had a Mirena IUD placed in 2016 for this and she states it is working great, but she does feel that it's wearing off b/c now she has cramps that she takes midol for but no bleeding (pt is amenorrheic).    Review of Systems: Pertinent items noted in HPI and remainder of comprehensive ROS otherwise negative.    Patient Active Problem List   Diagnosis Date Noted   IUD (intrauterine device) in place 12/27/2020   Decreased pedal pulses 12/27/2020   Lumbar facet arthropathy 12/27/2020   Flat foot 12/27/2020   Paresthesia of both feet 12/27/2020   Need for Tdap vaccination 07/31/2019   IUD threads lost 07/31/2019   Other nonspecific abnormal finding of lung field 07/31/2019   Pain of left lower extremity 12/13/2017   Long term systemic steroid user 12/13/2017   Contusion of left lower leg 12/13/2017   Sarcoidosis 10/11/2017   Esophageal reflux 10/11/2017   Mediastinal lymphadenopathy    Cough 09/23/2017   Headache syndrome 09/02/2017   Dizziness 09/02/2017   SOB (shortness of breath) 09/01/2017   Screen for STD (sexually transmitted disease) 09/01/2017   Screening for cervical cancer 09/01/2017   Cyst of breast 09/01/2017   Vaccine counseling 09/01/2017   Influenza vaccination declined 09/01/2017   Encounter for health maintenance examination in adult 09/01/2017   Abnormal chest x-ray 08/25/2017   OAB (overactive bladder) 08/25/2017   Witnessed apneic spells 08/25/2017   Snoring 08/25/2017   Daytime somnolence 08/25/2017   Other fatigue  08/25/2017   Adenomyosis 01/31/2015   Dysmenorrhea 01/31/2015    Past Medical History:  Past Medical History:  Diagnosis Date   Anemia    Arthritis    knees, hips   Complication of anesthesia    GERD (gastroesophageal reflux disease)    Headache    OAB (overactive bladder)    PONV (postoperative nausea and vomiting)    "patch works well."   Sarcoidosis 2019   Dr. Chesley Mires    Past Surgical History:  Past Surgical History:  Procedure Laterality Date   BREAST EXCISIONAL BIOPSY Right    2015   BREAST EXCISIONAL BIOPSY Left    2007 Papilloma removed   BREAST SURGERY     lt breast hematoma   CESAREAN SECTION     x 2   CHOLECYSTECTOMY N/A 06/28/2017   Procedure: LAPAROSCOPIC CHOLECYSTECTOMY;  Surgeon: Coralie Keens, MD;  Location: [place];  Service: General;  Laterality: N/A;   ESOPHAGOGASTRODUODENOSCOPY  05/2017   Dr. Collene Mares   HERNIA REPAIR     Umbilical Hernia   VIDEO BRONCHOSCOPY WITH ENDOBRONCHIAL ULTRASOUND N/A 09/28/2017   Procedure: VIDEO BRONCHOSCOPY WITH ENDOBRONCHIAL ULTRASOUND;  Surgeon: Collene Gobble, MD;  Location: [facility];  Service: Thoracic;  Laterality: N/A;   WISDOM TOOTH EXTRACTION     2    Past Obstetrical History:  OB History  Gravida Para Term Preterm AB Living  2 2 2         SAB IAB Ectopic Multiple Live Births               #  Outcome Date GA Lbr Len/2nd Weight Sex Delivery Anes PTL Lv  2 Term 2000    F CS-LTranv     1 Term 1997    F CS-LTranv       Past Gynecological History: As per HPI. History of Pap Smear(s): Yes.   Last pap 08/2017, which was negative  Social History:  Social History   Socioeconomic History   Marital status: Significant Other    Spouse name: Not on file   Number of children: Not on file   Years of education: Not on file   Highest education level: Not on file  Occupational History   Not on file  Tobacco Use   Smoking status: Never   Smokeless tobacco: Never  Vaping Use   Vaping Use: Never used  Substance and  Sexual Activity   Alcohol use: Yes    Alcohol/week: 4.0 standard drinks    Types: 2 Glasses of wine, 2 Shots of liquor per week    Comment: ocasional   Drug use: No   Sexual activity: Yes    Birth control/protection: Surgical  Other Topics Concern   Not on file  Social History Narrative   Lives with boyfriend, 50yo and 75yo daughter.  Does Occupational psychologist for [employer].  Exercise - walking 4-7 mi every morning at 5am.    07/2019   Social Determinants of Health   Financial Resource Strain: Not on file  Food Insecurity: Not on file  Transportation Needs: Not on file  Physical Activity: Not on file  Stress: Not on file  Social Connections: Not on file  Intimate Partner Violence: Not on file    Family History:  Family History  Problem Relation Age of Onset   Asthma Mother    Heart murmur Mother    Diabetes Mother    Sleep apnea Mother    Cancer Maternal Grandmother        stomach   Cancer Maternal Grandfather        prostate   Cancer Paternal Grandmother        breast   Kidney disease Paternal Grandmother        dialysis   Sarcoidosis Neg Hx    Heart disease Neg Hx    Stroke Neg Hx    Breast cancer Neg Hx     Health Maintenance:  Mammogram(s): Yes.   Date: 12/2020  Medications Sandra [name] had no medications administered during this visit. Current Outpatient Medications  Medication Sig Dispense Refill   LOTEMAX SM 0.38 % GEL Apply 1 drop to eye 2 (two) times daily.     methocarbamol (ROBAXIN) 500 MG tablet Take 1 tablet (500 mg total) by mouth every 8 (eight) hours as needed for muscle spasms. 20 tablet 0   Saline (NASOGEL) GEL Place 1 spray into both nostrils daily as needed (for moisture).     sodium chloride (OCEAN) 0.65 % SOLN nasal spray Place 2 sprays into both nostrils as needed for congestion.     levonorgestrel (MIRENA) 20 MCG/24HR IUD 1 each by Intrauterine route once.     No current facility-administered medications for this visit.     Allergies Penicillins, Influenza vaccines, and Prednisone   Physical Exam:  BP 119/78   Pulse 79   Ht 5\' 2"  (1.575 m)   Wt 213 lb 1.6 oz (96.7 kg)   BMI 38.98 kg/m  Body mass index is 38.98 kg/m.  General appearance: Well nourished, well developed female in no acute distress.  Cardiovascular: normal  s1 and s2.  No murmurs, rubs or gallops. Respiratory:  Clear to auscultation bilateral. Normal respiratory effort Abdomen: positive bowel sounds and no masses, hernias; diffusely non tender to palpation, non distended Neuro/Psych:  Normal mood and affect.  Skin:  Warm and dry.  Lymphatic:  No inguinal lymphadenopathy.   Pelvic exam: is not limited by body habitus EGBUS: within normal limits Vagina: within normal limits and with no blood or discharge in the vault Cervix: normal appearing cervix without tenderness, discharge or lesions. IUD strings NOT seen Uterus:  nonenlarged and non tender Adnexa:  normal adnexa and no mass, fullness, tenderness Rectovaginal: deferred  Laboratory: none  Radiology: no new imaging Narrative & Impression  CLINICAL DATA:  Right-sided flank pain   EXAM: CT ABDOMEN AND PELVIS WITHOUT CONTRAST   TECHNIQUE: Multidetector CT imaging of the abdomen and pelvis was performed following the standard protocol without IV contrast.   COMPARISON:  Dec 16, 2009   FINDINGS: Lower chest: The lung bases are clear. The heart size is normal.   Hepatobiliary: The liver is normal. Status post cholecystectomy.There is no biliary ductal dilation.   Pancreas: Normal contours without ductal dilatation. No peripancreatic fluid collection.   Spleen: Unremarkable.   Adrenals/Urinary Tract:   --Adrenal glands: Unremarkable.   --Right kidney/ureter: No hydronephrosis or radiopaque kidney stones.   --Left kidney/ureter: No hydronephrosis or radiopaque kidney stones.   --Urinary bladder: Unremarkable.   Stomach/Bowel:   --Stomach/Duodenum: No hiatal  hernia or other gastric abnormality. Normal duodenal course and caliber.   --Small bowel: Unremarkable.   --Colon: Unremarkable.   --Appendix: Normal.   Vascular/Lymphatic: Normal course and caliber of the major abdominal vessels.   --No retroperitoneal lymphadenopathy.   --No mesenteric lymphadenopathy.   --No pelvic or inguinal lymphadenopathy.   Reproductive: There is an IUD in place.   Other: No ascites or free air. There is a fat containing umbilical hernia. Superior to the umbilicus is an additional fat containing ventral wall hernia with an abdominal wall defect measuring 11 mm and hernia sac measuring approximately 2.7 cm.   Musculoskeletal. No acute displaced fractures.   IMPRESSION: 1. No acute abdominopelvic abnormality. Normal appendix. No radiopaque kidney stones. 2. Fat containing umbilical and ventral wall hernias. 3. Status post cholecystectomy.     Electronically Signed   By: Constance Holster M.D.   On: 05/26/2020 19:40    Assessment: pt stable  Plan: 1. Cervical cancer screening - Cytology - PAP( [place])  2. Screening for STD (sexually transmitted disease) - HIV Antibody (routine testing w rflx) - RPR - Hepatitis B Surface AntiGEN - Hepatitis C Antibody  3. IUD (intrauterine device) in place See below  4. Adenomyosis I told her that the Mirena is approved for 7 years for birth control but only 5 for uterine bleeding, so I told her I do recommend replacing it with a fresh one.  Since she has done so well with the Mirena I recommend doing another one but unfortunately we have to order it since we don't have any in stock.    5. Dysmenorrhea  6. Intrauterine contraceptive device threads lost, subsequent encounter Attempt in office removal and placement of new Mirena next visit  Orders Placed This Encounter  Procedures   HIV Antibody (routine testing w rflx)   RPR   Hepatitis B Surface AntiGEN   Hepatitis C Antibody      Durene Romans MD Attending Center for [facility])

## 2021-01-15 NOTE — Progress Notes (Signed)
Placed a request for Mirena IUD to be ordered for this Pt with Derinda Late, RN.

## 2021-01-16 LAB — HEPATITIS B SURFACE ANTIGEN: Hepatitis B Surface Ag: NEGATIVE

## 2021-01-16 LAB — HEPATITIS C ANTIBODY: Hep C Virus Ab: 0.1 s/co ratio (ref 0.0–0.9)

## 2021-01-16 LAB — RPR: RPR Ser Ql: NONREACTIVE

## 2021-01-16 LAB — HIV ANTIBODY (ROUTINE TESTING W REFLEX): HIV Screen 4th Generation wRfx: NONREACTIVE

## 2021-01-17 DIAGNOSIS — M79672 Pain in left foot: Secondary | ICD-10-CM | POA: Diagnosis not present

## 2021-01-17 DIAGNOSIS — M79671 Pain in right foot: Secondary | ICD-10-CM | POA: Diagnosis not present

## 2021-01-17 LAB — CYTOLOGY - PAP
Chlamydia: NEGATIVE
Comment: NEGATIVE
Comment: NEGATIVE
Comment: NEGATIVE
Comment: NORMAL
Diagnosis: NEGATIVE
High risk HPV: NEGATIVE
Neisseria Gonorrhea: NEGATIVE
Trichomonas: NEGATIVE

## 2021-01-17 NOTE — Progress Notes (Signed)
Pt's Mirena arrived in office 01/16/21

## 2021-01-28 ENCOUNTER — Other Ambulatory Visit: Payer: Self-pay | Admitting: Medical

## 2021-01-28 ENCOUNTER — Ambulatory Visit
Admission: RE | Admit: 2021-01-28 | Discharge: 2021-01-28 | Disposition: A | Discharge status: Home / Self Care | Payer: 59 | Source: Ambulatory Visit | Attending: Medical | Admitting: Medical

## 2021-01-28 ENCOUNTER — Other Ambulatory Visit: Payer: Self-pay

## 2021-01-28 DIAGNOSIS — N6321 Unspecified lump in the left breast, upper outer quadrant: Secondary | ICD-10-CM | POA: Diagnosis not present

## 2021-01-28 DIAGNOSIS — R928 Other abnormal and inconclusive findings on diagnostic imaging of breast: Secondary | ICD-10-CM

## 2021-01-28 DIAGNOSIS — R922 Inconclusive mammogram: Secondary | ICD-10-CM | POA: Diagnosis not present

## 2021-01-29 ENCOUNTER — Other Ambulatory Visit: Payer: Self-pay

## 2021-01-29 ENCOUNTER — Encounter: Payer: Self-pay | Admitting: Neurology

## 2021-01-29 DIAGNOSIS — R202 Paresthesia of skin: Secondary | ICD-10-CM

## 2021-01-30 ENCOUNTER — Ambulatory Visit
Admission: RE | Admit: 2021-01-30 | Discharge: 2021-01-30 | Disposition: A | Discharge status: Home / Self Care | Payer: 59 | Source: Ambulatory Visit | Attending: Medical | Admitting: Medical

## 2021-01-30 ENCOUNTER — Other Ambulatory Visit: Payer: Self-pay

## 2021-01-30 DIAGNOSIS — R928 Other abnormal and inconclusive findings on diagnostic imaging of breast: Secondary | ICD-10-CM

## 2021-01-30 DIAGNOSIS — N6012 Diffuse cystic mastopathy of left breast: Secondary | ICD-10-CM | POA: Diagnosis not present

## 2021-01-30 DIAGNOSIS — N6323 Unspecified lump in the left breast, lower outer quadrant: Secondary | ICD-10-CM | POA: Diagnosis not present

## 2021-01-31 LAB — HM MAMMOGRAPHY

## 2021-02-05 ENCOUNTER — Other Ambulatory Visit: Payer: 59

## 2021-02-06 ENCOUNTER — Ambulatory Visit (INDEPENDENT_AMBULATORY_CARE_PROVIDER_SITE_OTHER): Payer: 59 | Admitting: Obstetrics and Gynecology

## 2021-02-06 ENCOUNTER — Other Ambulatory Visit: Payer: Self-pay

## 2021-02-06 VITALS — BP 132/74 | HR 70 | Ht 62.0 in | Wt 213.5 lb

## 2021-02-06 DIAGNOSIS — T8332XD Displacement of intrauterine contraceptive device, subsequent encounter: Secondary | ICD-10-CM

## 2021-02-06 DIAGNOSIS — Z30432 Encounter for removal of intrauterine contraceptive device: Secondary | ICD-10-CM

## 2021-02-06 DIAGNOSIS — N882 Stricture and stenosis of cervix uteri: Secondary | ICD-10-CM | POA: Diagnosis not present

## 2021-02-06 DIAGNOSIS — Z3202 Encounter for pregnancy test, result negative: Secondary | ICD-10-CM | POA: Diagnosis not present

## 2021-02-06 LAB — POCT PREGNANCY, URINE: Preg Test, Ur: NEGATIVE

## 2021-02-06 NOTE — Procedures (Signed)
Intrauterine Device Removal (Attempt) Procedure Note  Patient seen on 01/15/21 as new patient referral for a new Mirena. Patient was using it for period control given her history of adenomyosis from her history of two prior c-sections. Pap was negative and HPV negative. No Mirenas in stock so patient was scheduled for visit today after ordering Mirena.   EGBUS normal. Vaginal vault normal. Cervix normal with IUD strings still NOT seen. Tenaculum applied to anterior lip of the cervix and I was unable to get past the internal os with the various IUD hooks; she did not tolerate this well.  I was able to get past the internal os with a sound but the patient was very uncomfortable and I was unable to get past the os with the IUD hooks after using the sound. Patient very uncomfortable so I stopped the removal attempt.  There were no complications.   I told her the only way to remove would be in the OR, which she is amenable to. She had a CT renal scan about 10 months ago that showed the IUD to be intrauterine but will get an ultrasound to confirm since going to the OR. Will post after ultrasound is done.  Sandra Romans MD Attending Center for [facility] (Faculty Practice) 02/06/2021

## 2021-02-13 ENCOUNTER — Other Ambulatory Visit: Payer: Self-pay

## 2021-02-13 ENCOUNTER — Ambulatory Visit
Admission: RE | Admit: 2021-02-13 | Discharge: 2021-02-13 | Disposition: A | Discharge status: Home / Self Care | Payer: 59 | Source: Ambulatory Visit | Attending: Obstetrics and Gynecology | Admitting: Obstetrics and Gynecology

## 2021-02-13 DIAGNOSIS — T8332XD Displacement of intrauterine contraceptive device, subsequent encounter: Secondary | ICD-10-CM | POA: Diagnosis not present

## 2021-02-13 DIAGNOSIS — D251 Intramural leiomyoma of uterus: Secondary | ICD-10-CM | POA: Diagnosis not present

## 2021-02-13 DIAGNOSIS — N854 Malposition of uterus: Secondary | ICD-10-CM | POA: Diagnosis not present

## 2021-02-17 ENCOUNTER — Telehealth: Payer: Self-pay

## 2021-02-17 NOTE — Telephone Encounter (Signed)
Patient called in requesting to speak with clinic staff regarding her results. Per patient this needs to be "ASAP", states she is taking 8 Advil a day due to pain

## 2021-02-17 NOTE — Telephone Encounter (Signed)
Called patient stating I am returning her phone call. Discussed Dr Ilda Basset is currently out of the office but should return Wednesday or Thursday to review her ultrasound results. Reviewed results with patient and told her he will likely go ahead and work on scheduling removal in the [place] when he returns. Patient states she is in severe excruciating pain and has to take 8 advil a day for the day and that still doesn't help. Patient reports dealing with this pain every day for 2 years and it is only getting worse. Patient reports frustration that it seems like no one wants to help her and tells her nothing is wrong. Patient is upset that she hasn't heard back from Dr Ilda Basset regarding her results and doesn't understand why it is taking so long as she hasn't had this experience before. Apologized to patient for her waiting and the pain she is experiencing. Discussed that Dr Ilda Basset has been out of the office since her ultrasound and hasn't seen the results yet. Told patient he should return Wednesday or Thursday and will likely work on getting the OR booked for her surgery after that. Advised patient that surgery may not occur for several weeks, it just depends on the availability of the OR. Patient became even more upset on the phone stating she feels like no one wants to do anything to help her and is just letting her suffer at home. Patient states she has had this pain for 2 years now and doesn't understand what is taking so long for someone to do something about it. Patient states she is taking 8 advil at a time for the pain and guesses we want her to OD and die from taking so much because we aren't doing anything for her. Patient states this is why black women die because no one takes their pain seriously or listens to them so they have to advocate for themselves otherwise they die. Patient stated that if we cannot help her or want to take her seriously she wants to find someone else that will. Apologized to  patient and told her her healthcare is important to [place] and we take her pain seriously but that doesn't mean we can do surgery for her this week. Told patient she is welcome to go to other healthcare provider if she would like. Was going to attempt to discuss pain/ibuprofen Rx with a provider but patient stated she was going to go somewhere else and hung up the phone.

## 2021-02-18 ENCOUNTER — Telehealth: Payer: Self-pay | Admitting: Obstetrics and Gynecology

## 2021-02-18 NOTE — Telephone Encounter (Signed)
GYN Telephone Note Patient called at [phone] and phone range like normal and then generic voicemail picked up; mailbox is full.  Number called again and same thing.    Inbasket message sent to patient  Durene Romans MD Attending Center for [place] (Faculty Practice) 02/18/2021 Time: 1155am

## 2021-02-19 ENCOUNTER — Telehealth: Payer: Self-pay | Admitting: Obstetrics and Gynecology

## 2021-02-19 NOTE — Telephone Encounter (Signed)
GYN Telephone Note  Patient called and u/s results reviewed. I told her I recommend IUD removal but I d/w her re: next steps. I told her to try not to take NSAIDs all day every day and to use it once or maybe twice a day a few times a week and to try and tolerate with apap and heating pad and hopefully we can get her surgery scheduled quickly.  I d/w her re: trying another medical therapy since the mirean did wor and I think her s/s are due to either it wearing off or it could be malpositioned even though it doesn't appear to be on u/s.  I also told her re: surgery and no uterine sparing has been shown to consistently work and that hysterectomy is the only one that is proven to work but it is a major surgery. I also told her that if everything looks okay for placement that she could do another mirena and I would look to see if there are any fibroids at the time and remove those.  Patient would like to proceed with hysteroscopy, iud removal and replacement, possible myomectomy  Durene Romans MD Attending Center for [place] (Faculty Practice) 02/19/2021 Time: 1118am

## 2021-02-21 ENCOUNTER — Encounter: Payer: Self-pay | Admitting: *Deleted

## 2021-02-21 ENCOUNTER — Telehealth: Payer: Self-pay | Admitting: *Deleted

## 2021-02-21 NOTE — Telephone Encounter (Signed)
Call from patient. Agreeable to Wednesday, 03-12-21 at 0730 at [facility]. Advised arrive at 0530. Advised will receive letter in mail and pre-op call from facility.  Encounter closed.

## 2021-02-21 NOTE — Telephone Encounter (Signed)
Call to patient regarding surgery date. Left message with August 17 and requested call back to [phone].

## 2021-03-05 ENCOUNTER — Encounter: Payer: 59 | Admitting: Neurology

## 2021-03-07 ENCOUNTER — Encounter (HOSPITAL_BASED_OUTPATIENT_CLINIC_OR_DEPARTMENT_OTHER): Payer: Self-pay | Admitting: Obstetrics and Gynecology

## 2021-03-07 ENCOUNTER — Ambulatory Visit: Payer: Self-pay | Admitting: Surgery

## 2021-03-07 ENCOUNTER — Other Ambulatory Visit: Payer: Self-pay

## 2021-03-07 DIAGNOSIS — D242 Benign neoplasm of left breast: Secondary | ICD-10-CM | POA: Diagnosis not present

## 2021-03-07 NOTE — Progress Notes (Signed)
Spoke w/ via phone for pre-op interview---Pt Lab needs dos----               Lab results------ COVID test -----patient states asymptomatic no test needed Arrive at -------0630 03/12/2021 NPO after MN NO Solid Food.  Clear liquids from MN until-0530 03/12/2021-- Med rec completed Medications to take morning of surgery -----Lotemax, Saline Gel, Ocean nasal spray Diabetic medication -----N/a Patient instructed no nail polish to be worn day of surgery Patient instructed to bring photo id and insurance card day of surgery Patient aware to have Driver (ride ) / caregiver  SO Sandra [name]  for 24 hours after surgery  Patient Special Instructions -----none Pre-Op special Istructions -----none Patient verbalized understanding of instructions that were given at this phone interview. Patient denies shortness of breath, chest pain, fever, cough at this phone interview.

## 2021-03-07 NOTE — H&P (Signed)
REFERRING PHYSICIAN:  Tysinger, Mertha Finders, *  PROVIDER:  Lissa Morales, MD  MRN: [id number] DOB: 1976-06-16 DATE OF ENCOUNTER: 03/07/2021  Subjective   Chief Complaint: Breast Mass (Intraductal papilloma left breast)     History of Present Illness: Sandra [name] is a 45 y.o. female who is seen today as an office consultation at the request of Dr. Aleen Campi for evaluation of Breast Mass (Intraductal papilloma left breast) .   This is a 45 year old female that I operated on in 2012 for a left breast papilloma and bloody nipple discharge.  The pathology was benign.    Recently, she underwent routine screening mammogram.  She has also noted some left lateral breast tenderness.  She was noted to have multiple cysts in her breast.  She also was found to have a solid mass at 0600 on the left 2 cmfn measuring 7 x 4 x 4 mm.  Biopsy revealed intraductal papilloma.  She presents now to discuss excision.    She had a previous right breast biopsy which was benign, as well as a laparoscopic cholecystectomy by Dr. Magnus Ivan.  She is scheduled for a gynecologic procedure next week.  Review of Systems: A complete review of systems was obtained from the patient.  I have reviewed this information and discussed as appropriate with the patient.  See HPI as well for other ROS.  Review of Systems  Constitutional: Negative.   HENT: Negative.   Eyes: Negative.   Respiratory: Negative.   Cardiovascular: Negative.   Gastrointestinal: Negative.   Genitourinary: Negative.   Musculoskeletal: Negative.   Skin: Negative.   Neurological: Negative.   Endo/Heme/Allergies: Negative.   Psychiatric/Behavioral: Negative.       Medical History: History reviewed. No pertinent past medical history.  Patient Active Problem List  Diagnosis   Cyst of breast   Esophageal reflux   Fibromyalgia   Headache syndrome    Past Surgical History:  Procedure Laterality Date   CESAREAN SECTION      CHOLECYSTECTOMY     HERNIA REPAIR       Allergies  Allergen Reactions   Penicillins Other (See Comments), Rash and Unknown    Has patient had a PCN reaction causing immediate rash, facial/tongue/throat swelling, SOB or lightheadedness with hypotension: yes Has patient had a PCN reaction causing severe rash involving mucus membranes or skin necrosis: no Has patient had a PCN reaction that required hospitalization: no Has patient had a PCN reaction occurring within the last 10 years: no If all of the above answers are "NO", then may proceed with Cephalosporin use. PATIENT HAS HAD A PCN REACTION WITH IMMEDIATE RASH, FACIAL/TONGUE/THROAT SWELLING, SOB, OR LIGHTHEADEDNESS WITH HYPOTENSION:  #  #  #  YES  #  #  #   Has patient had a PCN reaction causing severe rash involving mucus membranes or skin necrosis: no Has patient had a PCN reaction that required hospitalization: no Has patient had a PCN reaction occurring within the last 10 years: no     No current outpatient medications on file prior to visit.   No current facility-administered medications on file prior to visit.    History reviewed. No pertinent family history.   Social History   Tobacco Use  Smoking Status Never Smoker  Smokeless Tobacco Never Used     Social History   Socioeconomic History   Marital status: Life Partner  Tobacco Use   Smoking status: Never Smoker   Smokeless tobacco: Never Used  Advertising account planner  Vaping Use: Never used  Substance and Sexual Activity   Alcohol use: Yes   Drug use: Never    Objective:    Vitals:   03/07/21 1051  BP: 118/72  Pulse: 77  Temp: 36.3 C (97.4 F)  SpO2: 100%  Weight: 96.9 kg (213 lb 9.6 oz)  Height: 157.5 cm (5\' 2" )    Body mass index is 39.07 kg/m.  Physical Exam   Constitutional:  WDWN in NAD, conversant, no obvious deformities; lying in bed comfortably Eyes:  Pupils equal, round; sclera anicteric; moist conjunctiva; no lid lag HENT:  Oral mucosa moist;  good dentition  Neck:  No masses palpated, trachea midline; no thyromegaly Lungs:  CTA bilaterally; normal respiratory effort Breasts:  symmetric, no nipple changes; no palpable masses or lymphadenopathy on either side; left fibrocystic changes in lateral breast CV:  Regular rate and rhythm; no murmurs; extremities well-perfused with no edema Abd:  +bowel sounds, soft, non-tender, no palpable organomegaly; no palpable hernias Musc:  Unable to assess gait; no apparent clubbing or cyanosis in extremities Lymphatic:  No palpable cervical or axillary lymphadenopathy Skin:  Warm, dry; no sign of jaundice Psychiatric - alert and oriented x 4; calm mood and affect   Labs, Imaging and Diagnostic Testing: Diagnosis Breast, left, needle core biopsy, 6 o'clock, ribbin clip - FIBROCYSTIC CHANGES WITH APOCRINE METAPLASIA AND FOCAL FEATURES SUGGESTIVE OF INTRADUCTAL PAPILLOMA - NO ATYPIA OR MALIGNANCY IDENTIFIED Microscopic Comment SMM highlights the presence of a myoepithelial cell layer around the ductal proliferations. Cytokeratin 5/6 and ER shows mosaic pattern expression in the focus concerning for an intraductal papilloma. Manning Charity MD Pathologist, Electronic Signature (Case signed 01/31/2021)   CLINICAL DATA:  Patient recalled from screening for left breast asymmetry and calcifications.   EXAM: DIGITAL DIAGNOSTIC UNILATERAL LEFT MAMMOGRAM WITH TOMOSYNTHESIS AND CAD; ULTRASOUND LEFT BREAST LIMITED   TECHNIQUE: Left digital diagnostic mammography and breast tomosynthesis was performed. The images were evaluated with computer-aided detection.; Targeted ultrasound examination of the left breast was performed   COMPARISON:  Previous exam(s).   ACR Breast Density Category c: The breast tissue is heterogeneously dense, which may obscure small masses.   FINDINGS: Persistent low-density oval circumscribed mass within the upper-outer left breast. Additionally within the 6 o'clock  position left breast there is a partially obscured mass. Magnification views upper outer left breast demonstrate a single calcification which appears to layer on true lateral view, most consistent with milk of calcium.   Targeted ultrasound is performed, showing multiple cysts and clusters of cysts within the upper-outer left breast including a 4 mm cyst 2 o'clock position 3 cm from nipple and a 7 mm cyst 3 o'clock position 4 cm from nipple.   Within the left breast 6 o'clock position 2 cm from nipple there is a 7 x 4 x 4 mm solid and cystic mass.   No left axillary adenopathy.   IMPRESSION: Indeterminate solid and cystic left breast mass 6 o'clock position.   Fibrocystic changes upper outer left breast.   RECOMMENDATION: Ultrasound-guided core needle biopsy solid and cystic left breast mass 6 o'clock position.   I have discussed the findings and recommendations with the patient. If applicable, a reminder letter will be sent to the patient regarding the next appointment.   BI-RADS CATEGORY  4: Suspicious.     Electronically Signed   By: Annia Belt M.D.   On: 01/28/2021 10:01    Assessment and Plan:  Diagnoses and all orders for this visit:  Intraductal papilloma  of breast, left  Left radioactive seed localized lumpectomy.  The surgical procedure has been discussed with the patient.  Potential risks, benefits, alternative treatments, and expected outcomes have been explained.  All of the patient's questions at this time have been answered.  The likelihood of reaching the patient's treatment goal is good.  The patient understand the proposed surgical procedure and wishes to proceed.   No follow-ups on file.  Lissa Morales, MD  03/07/2021 3:40 PM

## 2021-03-10 ENCOUNTER — Other Ambulatory Visit: Payer: Self-pay | Admitting: Obstetrics and Gynecology

## 2021-03-10 ENCOUNTER — Other Ambulatory Visit: Payer: Self-pay | Admitting: Surgery

## 2021-03-10 DIAGNOSIS — D242 Benign neoplasm of left breast: Secondary | ICD-10-CM

## 2021-03-11 NOTE — Anesthesia Preprocedure Evaluation (Addendum)
Anesthesia Evaluation  Patient identified by MRN, date of birth, ID band Patient awake    History of Anesthesia Complications (+) PONV  Airway Mallampati: I  TM Distance: >3 FB Neck ROM: Full    Dental  (+) Dental Advisory Given   Pulmonary  sarcoid   breath sounds clear to auscultation       Cardiovascular negative cardio ROS   Rhythm:Regular Rate:Normal     Neuro/Psych negative neurological ROS     GI/Hepatic Neg liver ROS, GERD  Controlled,  Endo/Other  Morbid obesity  Renal/GU negative Renal ROS     Musculoskeletal   Abdominal (+) + obese,   Peds  Hematology negative hematology ROS (+)   Anesthesia Other Findings   Reproductive/Obstetrics                            Anesthesia Physical Anesthesia Plan  ASA: 3  Anesthesia Plan: General   Post-op Pain Management:    Induction: Intravenous  PONV Risk Score and Plan: 4 or greater and Dexamethasone and Scopolamine patch - Pre-op  Airway Management Planned: LMA  Additional Equipment: None  Intra-op Plan:   Post-operative Plan:   Informed Consent: I have reviewed the patients History and Physical, chart, labs and discussed the procedure including the risks, benefits and alternatives for the proposed anesthesia with the patient or authorized representative who has indicated his/her understanding and acceptance.     Dental advisory given  Plan Discussed with: CRNA and Surgeon  Anesthesia Plan Comments:        Anesthesia Quick Evaluation

## 2021-03-12 ENCOUNTER — Other Ambulatory Visit: Payer: Self-pay

## 2021-03-12 ENCOUNTER — Encounter: Payer: Self-pay | Admitting: Obstetrics and Gynecology

## 2021-03-12 ENCOUNTER — Encounter (HOSPITAL_BASED_OUTPATIENT_CLINIC_OR_DEPARTMENT_OTHER): Payer: Self-pay | Admitting: Obstetrics and Gynecology

## 2021-03-12 ENCOUNTER — Encounter (HOSPITAL_BASED_OUTPATIENT_CLINIC_OR_DEPARTMENT_OTHER)
Admission: RE | Disposition: A | Discharge status: Home / Self Care | Payer: Self-pay | Source: Home / Self Care | Attending: Obstetrics and Gynecology

## 2021-03-12 ENCOUNTER — Ambulatory Visit (HOSPITAL_BASED_OUTPATIENT_CLINIC_OR_DEPARTMENT_OTHER): Payer: 59 | Admitting: Anesthesiology

## 2021-03-12 ENCOUNTER — Telehealth: Payer: Self-pay

## 2021-03-12 ENCOUNTER — Ambulatory Visit (HOSPITAL_BASED_OUTPATIENT_CLINIC_OR_DEPARTMENT_OTHER)
Admission: RE | Admit: 2021-03-12 | Discharge: 2021-03-12 | Disposition: A | Discharge status: Home / Self Care | Payer: 59 | Attending: Obstetrics and Gynecology | Admitting: Obstetrics and Gynecology

## 2021-03-12 DIAGNOSIS — N3281 Overactive bladder: Secondary | ICD-10-CM | POA: Diagnosis not present

## 2021-03-12 DIAGNOSIS — R9389 Abnormal findings on diagnostic imaging of other specified body structures: Secondary | ICD-10-CM | POA: Insufficient documentation

## 2021-03-12 DIAGNOSIS — Z888 Allergy status to other drugs, medicaments and biological substances status: Secondary | ICD-10-CM | POA: Insufficient documentation

## 2021-03-12 DIAGNOSIS — N840 Polyp of corpus uteri: Secondary | ICD-10-CM | POA: Diagnosis not present

## 2021-03-12 DIAGNOSIS — Z887 Allergy status to serum and vaccine status: Secondary | ICD-10-CM | POA: Diagnosis not present

## 2021-03-12 DIAGNOSIS — Z79899 Other long term (current) drug therapy: Secondary | ICD-10-CM | POA: Insufficient documentation

## 2021-03-12 DIAGNOSIS — Z880 Allergy status to penicillin: Secondary | ICD-10-CM | POA: Insufficient documentation

## 2021-03-12 DIAGNOSIS — N800 Endometriosis of uterus: Secondary | ICD-10-CM | POA: Diagnosis not present

## 2021-03-12 DIAGNOSIS — Z30433 Encounter for removal and reinsertion of intrauterine contraceptive device: Secondary | ICD-10-CM | POA: Diagnosis not present

## 2021-03-12 DIAGNOSIS — Z9889 Other specified postprocedural states: Secondary | ICD-10-CM

## 2021-03-12 DIAGNOSIS — T8332XD Displacement of intrauterine contraceptive device, subsequent encounter: Secondary | ICD-10-CM | POA: Diagnosis not present

## 2021-03-12 DIAGNOSIS — N946 Dysmenorrhea, unspecified: Secondary | ICD-10-CM | POA: Diagnosis not present

## 2021-03-12 DIAGNOSIS — N882 Stricture and stenosis of cervix uteri: Secondary | ICD-10-CM | POA: Diagnosis not present

## 2021-03-12 HISTORY — PX: DILATATION & CURETTAGE/HYSTEROSCOPY WITH MYOSURE: SHX6511

## 2021-03-12 HISTORY — PX: INTRAUTERINE DEVICE (IUD) INSERTION: SHX5877

## 2021-03-12 HISTORY — PX: IUD REMOVAL: SHX5392

## 2021-03-12 LAB — POCT PREGNANCY, URINE: Preg Test, Ur: NEGATIVE

## 2021-03-12 SURGERY — DILATATION & CURETTAGE/HYSTEROSCOPY WITH MYOSURE
Anesthesia: General | Site: Vagina

## 2021-03-12 MED ORDER — IBUPROFEN 600 MG PO TABS: 600.0000 mg | ORAL_TABLET | Freq: Four times a day (QID) | ORAL | 3 refills | Status: DC | PRN

## 2021-03-12 MED ORDER — ONDANSETRON HCL 4 MG/2ML IJ SOLN
INTRAMUSCULAR | Status: DC | PRN
Start: 2021-03-12 — End: 2021-03-12
  Administered 2021-03-12: 4 mg via INTRAVENOUS

## 2021-03-12 MED ORDER — PROMETHAZINE HCL 25 MG/ML IJ SOLN: 6.2500 mg | INTRAMUSCULAR | Status: DC | PRN

## 2021-03-12 MED ORDER — MIDAZOLAM HCL 2 MG/2ML IJ SOLN: 0.5000 mg | Freq: Once | INTRAMUSCULAR | Status: DC | PRN

## 2021-03-12 MED ORDER — FENTANYL CITRATE (PF) 100 MCG/2ML IJ SOLN
INTRAMUSCULAR | Status: AC
  Filled 2021-03-12: qty 2

## 2021-03-12 MED ORDER — MIDAZOLAM HCL 2 MG/2ML IJ SOLN
INTRAMUSCULAR | Status: AC
  Filled 2021-03-12: qty 2

## 2021-03-12 MED ORDER — FENTANYL CITRATE (PF) 100 MCG/2ML IJ SOLN
25.0000 ug | INTRAMUSCULAR | Status: DC | PRN
  Administered 2021-03-12 (×3): 25 ug via INTRAVENOUS

## 2021-03-12 MED ORDER — LACTATED RINGERS IV SOLN: INTRAVENOUS | Status: DC

## 2021-03-12 MED ORDER — LEVONORGESTREL 20 MCG/DAY IU IUD
1.0000 | INTRAUTERINE_SYSTEM | Freq: Once | INTRAUTERINE | Status: AC
  Administered 2021-03-12: 1 via INTRAUTERINE

## 2021-03-12 MED ORDER — MIDAZOLAM HCL 5 MG/5ML IJ SOLN
INTRAMUSCULAR | Status: DC | PRN
  Administered 2021-03-12: 2 mg via INTRAVENOUS

## 2021-03-12 MED ORDER — ACETAMINOPHEN 500 MG PO TABS
ORAL_TABLET | ORAL | Status: AC
  Filled 2021-03-12: qty 2

## 2021-03-12 MED ORDER — KETOROLAC TROMETHAMINE 30 MG/ML IJ SOLN
INTRAMUSCULAR | Status: DC | PRN
  Administered 2021-03-12: 30 mg via INTRAVENOUS

## 2021-03-12 MED ORDER — OXYCODONE HCL 5 MG PO TABS
ORAL_TABLET | ORAL | Status: AC
  Filled 2021-03-12: qty 1

## 2021-03-12 MED ORDER — ONDANSETRON HCL 4 MG/2ML IJ SOLN
INTRAMUSCULAR | Status: AC
  Filled 2021-03-12: qty 2

## 2021-03-12 MED ORDER — ACETAMINOPHEN 500 MG PO TABS
1000.0000 mg | ORAL_TABLET | Freq: Once | ORAL | Status: AC
  Administered 2021-03-12: 1000 mg via ORAL

## 2021-03-12 MED ORDER — PROPOFOL 10 MG/ML IV BOLUS
INTRAVENOUS | Status: DC | PRN
  Administered 2021-03-12: 200 mg via INTRAVENOUS

## 2021-03-12 MED ORDER — SCOPOLAMINE 1 MG/3DAYS TD PT72
1.0000 | MEDICATED_PATCH | TRANSDERMAL | Status: DC
  Administered 2021-03-12: 1.5 mg via TRANSDERMAL

## 2021-03-12 MED ORDER — OXYCODONE HCL 5 MG/5ML PO SOLN: 5.0000 mg | Freq: Once | ORAL | Status: AC | PRN

## 2021-03-12 MED ORDER — MEPERIDINE HCL 25 MG/ML IJ SOLN: 6.2500 mg | INTRAMUSCULAR | Status: DC | PRN

## 2021-03-12 MED ORDER — LIDOCAINE HCL (PF) 2 % IJ SOLN
INTRAMUSCULAR | Status: AC
  Filled 2021-03-12: qty 5

## 2021-03-12 MED ORDER — SODIUM CHLORIDE 0.9 % IR SOLN
Status: DC | PRN
  Administered 2021-03-12: 1000 mL

## 2021-03-12 MED ORDER — FENTANYL CITRATE (PF) 100 MCG/2ML IJ SOLN
INTRAMUSCULAR | Status: DC | PRN
  Administered 2021-03-12 (×2): 50 ug via INTRAVENOUS

## 2021-03-12 MED ORDER — SCOPOLAMINE 1 MG/3DAYS TD PT72
MEDICATED_PATCH | TRANSDERMAL | Status: AC
  Filled 2021-03-12: qty 1

## 2021-03-12 MED ORDER — DEXAMETHASONE SODIUM PHOSPHATE 10 MG/ML IJ SOLN
INTRAMUSCULAR | Status: AC
  Filled 2021-03-12: qty 1

## 2021-03-12 MED ORDER — DEXAMETHASONE SODIUM PHOSPHATE 4 MG/ML IJ SOLN
INTRAMUSCULAR | Status: DC | PRN
  Administered 2021-03-12: 10 mg via INTRAVENOUS

## 2021-03-12 MED ORDER — ACETAMINOPHEN 500 MG PO TABS: 500.0000 mg | ORAL_TABLET | Freq: Four times a day (QID) | ORAL | 0 refills | Status: DC | PRN

## 2021-03-12 MED ORDER — PROPOFOL 10 MG/ML IV BOLUS
INTRAVENOUS | Status: AC
  Filled 2021-03-12: qty 40

## 2021-03-12 MED ORDER — LIDOCAINE HCL (CARDIAC) PF 100 MG/5ML IV SOSY
PREFILLED_SYRINGE | INTRAVENOUS | Status: DC | PRN
  Administered 2021-03-12: 20 mg via INTRAVENOUS

## 2021-03-12 MED ORDER — LEVONORGESTREL 20 MCG/DAY IU IUD
INTRAUTERINE_SYSTEM | INTRAUTERINE | Status: AC
  Filled 2021-03-12: qty 1

## 2021-03-12 MED ORDER — OXYCODONE HCL 5 MG PO TABS
5.0000 mg | ORAL_TABLET | Freq: Once | ORAL | Status: AC | PRN
  Administered 2021-03-12: 5 mg via ORAL

## 2021-03-12 MED ORDER — LIDOCAINE HCL 1 % IJ SOLN
INTRAMUSCULAR | Status: DC | PRN
  Administered 2021-03-12: 20 mL

## 2021-03-12 SURGICAL SUPPLY — 24 items
CATH ROBINSON RED A/P 16FR (CATHETERS) ×3 IMPLANT
DEVICE MYOSURE LITE (MISCELLANEOUS) IMPLANT
DEVICE MYOSURE REACH (MISCELLANEOUS) IMPLANT
DILATOR CANAL MILEX (MISCELLANEOUS) IMPLANT
GAUZE 4X4 16PLY ~~LOC~~+RFID DBL (SPONGE) ×5 IMPLANT
GLOVE SURG POLYISO LF SZ7 (GLOVE) ×3 IMPLANT
GLOVE SURG UNDER POLY LF SZ7 (GLOVE) ×3 IMPLANT
GLOVE SURG UNDER POLY LF SZ7.5 (GLOVE) ×3 IMPLANT
GOWN STRL REUS W/ TWL XL LVL3 (GOWN DISPOSABLE) ×2 IMPLANT
GOWN STRL REUS W/TWL XL LVL3 (GOWN DISPOSABLE) ×6 IMPLANT
IV NS IRRIG 3000ML ARTHROMATIC (IV SOLUTION) ×1 IMPLANT
KIT PROCEDURE FLUENT (KITS) ×3 IMPLANT
MYOSURE XL FIBROID (MISCELLANEOUS)
Mirena Levonorgestrel-Releasing Intrauterine Syste ×1 IMPLANT
NS IRRIG 1000ML POUR BTL (IV SOLUTION) ×2 IMPLANT
NS IRRIG 500ML POUR BTL (IV SOLUTION) ×1 IMPLANT
PACK VAGINAL MINOR WOMEN LF (CUSTOM PROCEDURE TRAY) ×3 IMPLANT
PAD OB MATERNITY 4.3X12.25 (PERSONAL CARE ITEMS) ×3 IMPLANT
PAD PREP 24X48 CUFFED NSTRL (MISCELLANEOUS) ×3 IMPLANT
SEAL ROD LENS SCOPE MYOSURE (ABLATOR) ×3 IMPLANT
SUT VIC AB 2-0 SH 27 (SUTURE)
SUT VIC AB 2-0 SH 27XBRD (SUTURE) IMPLANT
SYSTEM TISS REMOVAL MYOSURE XL (MISCELLANEOUS) IMPLANT
TOWEL OR 17X26 10 PK STRL BLUE (TOWEL DISPOSABLE) ×3 IMPLANT

## 2021-03-12 NOTE — Discharge Instructions (Addendum)
We will discuss your surgery once again in detail at your post-op visit in two to four weeks. If you haven't already done so, please call to make your appointment as soon as possible.  Hysteroscopy, Care After These instructions give you information on caring for yourself after your procedure. Your doctor may also give you more specific instructions. Call your doctor if you have any problems or questions after your procedure. HOME CARE Do not drive for 24 hours. Wait 1 week before doing any activities that wear you out. Do not stand for a long time. Limit stair climbing to once or twice a day. Rest often. Continue with your usual diet. Drink enough fluids to keep your pee (urine) clear or pale yellow. If you have a hard time pooping (constipation), you may: Take a medicine to help you go poop (laxative) as told by your doctor. Eat more fruit and bran. Drink more fluids. Take showers, not baths, for as long as told by your doctor. Do not swim or use a hot tub until your doctor says it is okay. Have someone with you for 1day after the procedure. Do not douche, use tampons, or have sex (intercourse) until seen by your doctor Only take medicines as told by your doctor. Do not take aspirin. It can cause bleeding. Keep all doctor visits. GET HELP IF: You have cramps or pain not helped by medicine. You have new pain in the belly (abdomen). You have a bad smelling fluid coming from your vagina. You have a rash. You have problems with any medicine. GET HELP RIGHT AWAY IF:  You start to bleed more than a regular period. You have a fever. You have chest pain. You have trouble breathing. You feel dizzy or feel like passing out (fainting). You pass out. You have pain in the tops of your shoulders. You have vaginal bleeding with or without clumps of blood (blood clots). MAKE SURE YOU: Understand these instructions. Will watch your condition. Will get help right away if you are not doing  well or get worse. Document Released: 04/21/2008 Document Revised: 07/18/2013 Document Reviewed: 02/09/2013 [facility] Patient Information 2015 [place], [place]. This information is not intended to replace advice given to you by your health care provider. Make sure you discuss any questions you have with your health care provider.   Post Anesthesia Home Care Instructions  Activity: Get plenty of rest for the remainder of the day. A responsible adult should stay with you for 24 hours following the procedure.  For the next 24 hours, DO NOT: -Drive a car -Paediatric nurse -Drink alcoholic beverages -Take any medication unless instructed by your physician -Make any legal decisions or sign important papers.  Meals: Start with liquid foods such as gelatin or soup. Progress to regular foods as tolerated. Avoid greasy, spicy, heavy foods. If nausea and/or vomiting occur, drink only clear liquids until the nausea and/or vomiting subsides. Call your physician if vomiting continues.  Special Instructions/Symptoms: Your throat may feel dry or sore from the anesthesia or the breathing tube placed in your throat during surgery. If this causes discomfort, gargle with warm salt water. The discomfort should disappear within 24 hours.  If you had a scopolamine patch placed behind your ear for the management of post- operative nausea and/or vomiting:  1. The medication in the patch is effective for 72 hours, after which it should be removed.  Wrap patch in a tissue and discard in the trash. Wash hands thoroughly with soap and water.  2. You may remove the patch earlier than 72 hours if you experience unpleasant side effects which may include dry mouth, dizziness or visual disturbances. 3. Avoid touching the patch. Wash your hands with soap and water after contact with the patch.   DISCHARGE INSTRUCTIONS: D&C / D&E The following instructions have been prepared to help you care for yourself upon your return  home.   Personal hygiene:  Use sanitary pads for vaginal drainage, not tampons.  Shower the day after your procedure.  NO tub baths, pools or Jacuzzis for 2-3 weeks.  Wipe front to back after using the bathroom.  Activity and limitations:  Do NOT drive or operate any equipment for 24 hours. The effects of anesthesia are still present and drowsiness may result.  Do NOT rest in bed all day.  Walking is encouraged.  Walk up and down stairs slowly.  You may resume your normal activity in one to two days or as indicated by your physician.  Sexual activity: NO intercourse for at least 2 weeks after the procedure, or as indicated by your physician.  Diet: Eat a light meal as desired this evening. You may resume your usual diet tomorrow.  Return to work: You may resume your work activities in one to two days or as indicated by your doctor.  What to expect after your surgery: Expect to have vaginal bleeding/discharge for 2-3 days and spotting for up to 10 days. It is not unusual to have soreness for up to 1-2 weeks. You may have a slight burning sensation when you urinate for the first day. Mild cramps may continue for a couple of days. You may have a regular period in 2-6 weeks.  Call your doctor for any of the following:  Excessive vaginal bleeding, saturating and changing one pad every hour.  Inability to urinate 6 hours after discharge from hospital.  Pain not relieved by pain medication.  Fever of 100.4 F or greater.  Unusual vaginal discharge or odor.   Call for an appointment:

## 2021-03-12 NOTE — H&P (Signed)
Obstetrics & Gynecology Surgical H&P   Date of Surgery: 03/12/2021    Primary OBGYN: [facility]  Reason for Admission: scheduled surgery  History of Present Illness: Sandra [name] is a 45 y.o. G2P2 (Patient's last menstrual period was 03/12/2021 (exact date).), with the above CC. PMHx is significant for lost IUD strings, c/s x 2, adenomyosis and fibroids on u/s  Patient here for Mirena removal, placement of a new and removal of any fibroids, polyps due to tolerate in office IUD removal.   Patient is doing well and not having any questions or issues  ROS: A 12-point review of systems was performed and negative, except as stated in the above HPI.  OBGYN History: As per HPI. OB History  Gravida Para Term Preterm AB Living  '2 2 2        '$ SAB IAB Ectopic Multiple Live Births               # Outcome Date GA Lbr Len/2nd Weight Sex Delivery Anes PTL Lv  2 Term 2000    F CS-LTranv     1 Term 1997    F CS-LTranv       Pap neg 2022   Past Medical History: Past Medical History:  Diagnosis Date   Anemia    Arthritis    knees, hips   Complication of anesthesia    nausea and vomiting , dose well with patch   GERD (gastroesophageal reflux disease)    Headache    OAB (overactive bladder)    PONV (postoperative nausea and vomiting)    "patch works well."   Sarcoidosis 2019   Dr. Chesley Mires    Past Surgical History: Past Surgical History:  Procedure Laterality Date   BREAST EXCISIONAL BIOPSY Right    2015   BREAST EXCISIONAL BIOPSY Left    2007 Papilloma removed   BREAST SURGERY     lt breast hematoma   CESAREAN SECTION     x 2   CHOLECYSTECTOMY N/A 06/28/2017   Procedure: LAPAROSCOPIC CHOLECYSTECTOMY;  Surgeon: Coralie Keens, MD;  Location: [facility];  Service: General;  Laterality: N/A;   ESOPHAGOGASTRODUODENOSCOPY  05/2017   Dr. Collene Mares   HERNIA REPAIR     Umbilical Hernia   VIDEO BRONCHOSCOPY WITH ENDOBRONCHIAL ULTRASOUND N/A 09/28/2017    Procedure: VIDEO BRONCHOSCOPY WITH ENDOBRONCHIAL ULTRASOUND;  Surgeon: Collene Gobble, MD;  Location: [facility] OR;  Service: Thoracic;  Laterality: N/A;   WISDOM TOOTH EXTRACTION     2    Family History:  Family History  Problem Relation Age of Onset   Asthma Mother    Heart murmur Mother    Diabetes Mother    Sleep apnea Mother    Cancer Maternal Grandmother        stomach   Cancer Maternal Grandfather        prostate   Cancer Paternal Grandmother        breast   Kidney disease Paternal Grandmother        dialysis   Sarcoidosis Neg Hx    Heart disease Neg Hx    Stroke Neg Hx    Breast cancer Neg Hx      Social History:  Social History   Socioeconomic History   Marital status: Significant Other    Spouse name: Not on file   Number of children: Not on file   Years of education: Not on file   Highest education level: Not on file  Occupational History  Not on file  Tobacco Use   Smoking status: Never   Smokeless tobacco: Never  Vaping Use   Vaping Use: Never used  Substance and Sexual Activity   Alcohol use: Yes    Alcohol/week: 3.0 standard drinks    Types: 3 Glasses of wine per week    Comment: occasional   Drug use: Yes    Types: Marijuana    Comment: on occasion will smoke, last use 03/09/21   Sexual activity: Yes    Birth control/protection: Surgical  Other Topics Concern   Not on file  Social History Narrative   Lives with boyfriend, 83yo and 61yo daughter.  Does Occupational psychologist for [employer].  Exercise - walking 4-7 mi every morning at 5am.    07/2019   Social Determinants of Health   Financial Resource Strain: Not on file  Food Insecurity: Not on file  Transportation Needs: Not on file  Physical Activity: Not on file  Stress: Not on file  Social Connections: Not on file  Intimate Partner Violence: Not on file     Allergy: Allergies  Allergen Reactions   Penicillins Rash and Other (See Comments)    PATIENT HAS HAD A PCN REACTION WITH  IMMEDIATE RASH, FACIAL/TONGUE/THROAT SWELLING, SOB, OR LIGHTHEADEDNESS WITH HYPOTENSION:  #  #  #  YES  #  #  #   Has patient had a PCN reaction causing severe rash involving mucus membranes or skin necrosis: no Has patient had a PCN reaction that required hospitalization: no Has patient had a PCN reaction occurring within the last 10 years: no    Influenza Vaccines Other (See Comments)    UNSPECIFIED ILLNESS was sick for 1 yr after getting flu shot   Prednisone Other (See Comments)    Causes open sores     Current Outpatient Medications: Medications Prior to Admission  Medication Sig Dispense Refill Last Dose   Loteprednol Etabonate (EYSUVIS) 0.25 % SUSP Apply to eye.   03/12/2021   Probiotic Product (PROBIOTIC BLEND PO) Take by mouth 3 x daily with food.   03/11/2021   levonorgestrel (MIRENA) 20 MCG/24HR IUD 1 each by Intrauterine route once.      LOTEMAX SM 0.38 % GEL Apply 1 drop to eye 2 (two) times daily. (Patient not taking: Reported on 03/12/2021)   Not Taking   Saline (NASOGEL) GEL Place 1 spray into both nostrils daily as needed (for moisture).   More than a month   sodium chloride (OCEAN) 0.65 % SOLN nasal spray Place 2 sprays into both nostrils as needed for congestion.   More than a month     Hospital Medications: Current Facility-Administered Medications  Medication Dose Route Frequency Provider Last Rate Last Admin   lactated ringers infusion   Intravenous Continuous Audry Pili, MD 50 mL/hr at 03/12/21 0740 Continued from Pre-op at 03/12/21 0740   lactated ringers infusion   Intravenous Continuous Aletha Halim, MD       levonorgestrel (MIRENA) 20 MCG/DAY IUD 1 each  1 each Intrauterine Once Aletha Halim, MD       scopolamine (TRANSDERM-SCOP) 1 MG/3DAYS 1.5 mg  1 patch Transdermal Q72H Annye Asa, MD   1.5 mg at 03/12/21 [id number]     Physical Exam:  Current Vital Signs 24h Vital Sign Ranges  T 98 F (36.7 C) Temp  Avg: 98 F (36.7 C)  Min: 98 F (36.7  C)  Max: 98 F (36.7 C)  BP 117/73 BP  Min: 117/73  Max:  117/73  HR 71 Pulse  Avg: 71  Min: 71  Max: 71  RR 17 Resp  Avg: 17  Min: 17  Max: 17  SaO2 100 % Room Air SpO2  Avg: 100 %  Min: 100 %  Max: 100 %       24 Hour I/O Current Shift I/O  Time Ins Outs No intake/output data recorded. No intake/output data recorded.    Body mass index is 39.21 kg/m. General appearance: Well nourished, well developed female in no acute distress.  Cardiovascular: S1, S2 normal, no murmur, rub or gallop, regular rate and rhythm Respiratory:  Clear to auscultation bilateral. Normal respiratory effort Abdomen: positive bowel sounds and no masses, hernias; diffusely non tender to palpation, non distended Neuro/Psych:  Normal mood and affect.  Skin:  Warm and dry.  Extremities: no clubbing, cyanosis, or edema.   Laboratory: UPT: negative  Imaging:  Narrative & Impression  CLINICAL DATA:  Indeterminate intrauterine device location, pelvic pain   EXAM: ULTRASOUND PELVIS TRANSVAGINAL   TECHNIQUE: Transvaginal ultrasound examination of the pelvis was performed including evaluation of the uterus, ovaries, adnexal regions, and pelvic cul-de-sac.   COMPARISON:  None.   FINDINGS: Uterus   Measurements: 7.4 x 4.2 x 4.9 cm = volume: 79 mL. The uterus is retroverted. The myometrial echotexture is coarsened and multiple heterogeneously hypoechoic masses are identified within the uterine fundus in keeping with multiple uterine fibroids. At least 2 intramural lesions are identified measuring up to 3.3 x 1.4 x 2.4 cm. A a exophytic lesion arises from the fundus measuring 1.6 x 1.3 x 1.8 cm.   Endometrium   Thickness: Obscured by intrauterine device, but not overtly thickened measuring up to 5 mm segmentally. An intrauterine device is in expected position within the uterine cavity.   Right ovary   Measurements: 1.9 x 1.5 x 1.1 cm = volume: 2 mL. Normal appearance/no adnexal mass.   Left  ovary   Measurements: 3.0 x 1.5 x 1.2 cm = volume: 3 mL. Normal appearance/no adnexal mass.   Other findings:  No abnormal free fluid   IMPRESSION: Retroverted uterus.   Intrauterine device in expected position within the endometrial cavity.   Multiple intramural and exophytic uterine fibroids measuring up to 3.3 cm.     Electronically Signed   By: Fidela Salisbury MD   On: 02/13/2021 22:07    Assessment: Ms. Meggitt is a 45 y.o. G2P2 (Patient's last menstrual period was 03/12/2021 (exact date).) here for scheduled surgery; pt doing well Plan: Plan d/w her for hysteroscopy, IUD removal and replacement with new Mirena and possible myosure myomectomy/polypectomy  Can proceed when OR is ready.    Durene Romans MD Attending Center for [facility])

## 2021-03-12 NOTE — Telephone Encounter (Signed)
Patient left VM on nurse line requesting letter to return to work tomorrow. Per chart review, letter has been included in [place]. Called pt; VM left stating letter is in [place]. Encouraged pt to call with further concerns.

## 2021-03-12 NOTE — Transfer of Care (Signed)
Immediate Anesthesia Transfer of Care Note  Patient: Sandra [name]  Procedure(s) Performed: DILATATION & CURETTAGE/HYSTEROSCOPY (Vagina ) INTRAUTERINE DEVICE (IUD) REMOVAL (Uterus) INTRAUTERINE DEVICE (IUD) INSERTION Mirena (Uterus)  Patient Location: PACU  Anesthesia Type:General  Level of Consciousness: drowsy  Airway & Oxygen Therapy: Patient Spontanous Breathing and Patient connected to face mask oxygen  Post-op Assessment: Report given to RN and Post -op Vital signs reviewed and stable  Post vital signs: Reviewed and stable  Last Vitals:  Vitals Value Taken Time  BP 115/74 03/12/21 0911  Temp    Pulse    Resp 23 03/12/21 0913  SpO2    Vitals shown include unvalidated device data.  Last Pain:  Vitals:   03/12/21 0653  TempSrc: Oral  PainSc: 5       Patients Stated Pain Goal: 7 ([phone] [id number])  Complications: No notable events documented.

## 2021-03-12 NOTE — Anesthesia Procedure Notes (Signed)
Procedure Name: LMA Insertion Date/Time: 03/12/2021 8:35 AM Performed by: Lieutenant Diego, CRNA Pre-anesthesia Checklist: Patient identified, Emergency Drugs available, Suction available and Patient being monitored Patient Re-evaluated:Patient Re-evaluated prior to induction Oxygen Delivery Method: Circle system utilized Preoxygenation: Pre-oxygenation with 100% oxygen Induction Type: IV induction Ventilation: Mask ventilation without difficulty LMA: LMA inserted LMA Size: 4.0 Number of attempts: 1 Placement Confirmation: positive ETCO2 and breath sounds checked- equal and bilateral Tube secured with: Tape Dental Injury: Teeth and Oropharynx as per pre-operative assessment

## 2021-03-12 NOTE — OR Nursing (Signed)
IUD removed in [facility] OR #4 by Dr. Ilda Basset.

## 2021-03-12 NOTE — Anesthesia Postprocedure Evaluation (Signed)
Anesthesia Post Note  Patient: Margaretta Cheff  Procedure(s) Performed: DILATATION & CURETTAGE/HYSTEROSCOPY (Vagina ) INTRAUTERINE DEVICE (IUD) REMOVAL (Uterus) INTRAUTERINE DEVICE (IUD) INSERTION Mirena (Uterus)     Patient location during evaluation: PACU Anesthesia Type: General Level of consciousness: awake and alert, patient cooperative and oriented Pain management: pain level controlled Vital Signs Assessment: post-procedure vital signs reviewed and stable Respiratory status: spontaneous breathing, nonlabored ventilation and respiratory function stable Cardiovascular status: blood pressure returned to baseline and stable Postop Assessment: no apparent nausea or vomiting and able to ambulate Anesthetic complications: no   No notable events documented.  Last Vitals:  Vitals:   03/12/21 0930 03/12/21 0945  BP: 101/78 (!) 135/95  Pulse: 70 69  Resp: 15 17  Temp:    SpO2: 96% 99%    Last Pain:  Vitals:   03/12/21 0945  TempSrc:   PainSc: 3                  Damir Leung,E. Azariya Freeman

## 2021-03-12 NOTE — Op Note (Addendum)
Operative Note   03/12/2021  PRE-OP DIAGNOSIS: Lost intrauterine device. Expired intrauterine device.    POST-OP DIAGNOSIS: Same. Possible endometrial polyps   SURGEON: Surgeon(s) and Role:    * Aletha Halim, MD - Primary  ASSISTANT: None  PROCEDURE: Intrauterine device Removal. Hysteroscopy. Dilation and Curettage. Placement of Mirena intrauterine device.   ANESTHESIA: General and paracervical block  ESTIMATED BLOOD LOSS: 56m  DRAINS: None   TOTAL IV FLUIDS: Per OR report  SPECIMENS: endometrial curettings  VTE PROPHYLAXIS: SCDs to the bilateral lower extremities  ANTIBIOTICS: Not indicated  FLUID DEFICIT: [id number] COMPLICATIONS: None  DISPOSITION: PACU - hemodynamically stable.  CONDITION: stable  INDICATION: Inability to tolerate office exam.   FINDINGS: Exam under anesthesia revealed small, mobile and retroverted uterus with no masses and bilateral adnexa without masses or fullness. Intact IUD removed with strings 4cm but curled around each other.  Hysteroscopy revealed a grossly normal appearing uterine cavity with possible endometrial polyps, obscured bilateral tubal ostia, normal appearing endocervical canal. No fibroids noted and no potential polyps noted at the end of the D&C.   PROCEDURE IN DETAIL:  After informed consent was obtained, the patient was taken to the operating room where anesthesia was obtained without difficulty. The patient was positioned in the dorsal lithotomy position in [place]  The patient was examined under anesthesia, with the above noted findings.  The bi-valved speculum was placed inside the patient's vagina, and the the posterior lip of the cervix was seen and grasped with the tenaculum.  A paracervical block was achieved with 267m1% lidocaine.  The uterine cavity was sounded to 8cm and the IUD was easily removed with uterine packing forceps; IUD intact. The cervix was progressively dilated to a 21 French-Pratt dilator.  The  hysteroscope was introduced, with the above noted findings. The hystersocope was removed and the uterine cavity was curetted until a gritty texture was noted, yielding small amount of endometrial curettings.  Mirena IUD ([id number], 2024 AUG) placed per manufacturer's instructions and strings cut to 4cm.  All instruments were removed, with excellent hemostasis noted throughout.  She was then taken out of dorsal lithotomy. The patient tolerated the procedure well.  Sponge, lap and instrument counts were correct x2.  The patient was taken to recovery room in excellent condition.  ChDurene RomansD Attending Center for [employer]

## 2021-03-13 ENCOUNTER — Encounter (HOSPITAL_BASED_OUTPATIENT_CLINIC_OR_DEPARTMENT_OTHER): Payer: Self-pay | Admitting: Obstetrics and Gynecology

## 2021-03-13 LAB — SURGICAL PATHOLOGY

## 2021-03-26 ENCOUNTER — Encounter: Payer: Self-pay | Admitting: Internal Medicine

## 2021-04-01 ENCOUNTER — Other Ambulatory Visit: Payer: Self-pay

## 2021-04-01 ENCOUNTER — Encounter (HOSPITAL_BASED_OUTPATIENT_CLINIC_OR_DEPARTMENT_OTHER): Payer: Self-pay | Admitting: Surgery

## 2021-04-07 ENCOUNTER — Ambulatory Visit
Admission: RE | Admit: 2021-04-07 | Discharge: 2021-04-07 | Disposition: A | Discharge status: Home / Self Care | Payer: 59 | Source: Ambulatory Visit | Attending: Surgery | Admitting: Surgery

## 2021-04-07 ENCOUNTER — Other Ambulatory Visit: Payer: Self-pay

## 2021-04-07 DIAGNOSIS — R928 Other abnormal and inconclusive findings on diagnostic imaging of breast: Secondary | ICD-10-CM | POA: Diagnosis not present

## 2021-04-07 DIAGNOSIS — D242 Benign neoplasm of left breast: Secondary | ICD-10-CM

## 2021-04-07 NOTE — Progress Notes (Signed)

## 2021-04-08 ENCOUNTER — Encounter (HOSPITAL_BASED_OUTPATIENT_CLINIC_OR_DEPARTMENT_OTHER)
Admission: RE | Disposition: A | Discharge status: Home / Self Care | Payer: Self-pay | Source: Home / Self Care | Attending: Surgery

## 2021-04-08 ENCOUNTER — Ambulatory Visit (HOSPITAL_BASED_OUTPATIENT_CLINIC_OR_DEPARTMENT_OTHER): Payer: 59 | Admitting: Certified Registered"

## 2021-04-08 ENCOUNTER — Other Ambulatory Visit: Payer: Self-pay

## 2021-04-08 ENCOUNTER — Encounter: Payer: 59 | Admitting: Neurology

## 2021-04-08 ENCOUNTER — Ambulatory Visit (HOSPITAL_BASED_OUTPATIENT_CLINIC_OR_DEPARTMENT_OTHER)
Admission: RE | Admit: 2021-04-08 | Discharge: 2021-04-08 | Disposition: A | Discharge status: Home / Self Care | Payer: 59 | Attending: Surgery | Admitting: Surgery

## 2021-04-08 ENCOUNTER — Encounter (HOSPITAL_BASED_OUTPATIENT_CLINIC_OR_DEPARTMENT_OTHER): Payer: Self-pay | Admitting: Surgery

## 2021-04-08 ENCOUNTER — Ambulatory Visit
Admission: RE | Admit: 2021-04-08 | Discharge: 2021-04-08 | Disposition: A | Discharge status: Home / Self Care | Payer: 59 | Source: Ambulatory Visit | Attending: Surgery | Admitting: Surgery

## 2021-04-08 DIAGNOSIS — N6092 Unspecified benign mammary dysplasia of left breast: Secondary | ICD-10-CM | POA: Diagnosis not present

## 2021-04-08 DIAGNOSIS — D649 Anemia, unspecified: Secondary | ICD-10-CM | POA: Diagnosis not present

## 2021-04-08 DIAGNOSIS — N319 Neuromuscular dysfunction of bladder, unspecified: Secondary | ICD-10-CM | POA: Diagnosis not present

## 2021-04-08 DIAGNOSIS — Z880 Allergy status to penicillin: Secondary | ICD-10-CM | POA: Insufficient documentation

## 2021-04-08 DIAGNOSIS — K219 Gastro-esophageal reflux disease without esophagitis: Secondary | ICD-10-CM | POA: Diagnosis not present

## 2021-04-08 DIAGNOSIS — N6489 Other specified disorders of breast: Secondary | ICD-10-CM | POA: Diagnosis not present

## 2021-04-08 DIAGNOSIS — N6032 Fibrosclerosis of left breast: Secondary | ICD-10-CM | POA: Diagnosis not present

## 2021-04-08 DIAGNOSIS — D242 Benign neoplasm of left breast: Secondary | ICD-10-CM

## 2021-04-08 DIAGNOSIS — D241 Benign neoplasm of right breast: Secondary | ICD-10-CM | POA: Diagnosis not present

## 2021-04-08 DIAGNOSIS — R928 Other abnormal and inconclusive findings on diagnostic imaging of breast: Secondary | ICD-10-CM | POA: Diagnosis not present

## 2021-04-08 DIAGNOSIS — N6012 Diffuse cystic mastopathy of left breast: Secondary | ICD-10-CM | POA: Diagnosis not present

## 2021-04-08 HISTORY — PX: BREAST LUMPECTOMY WITH RADIOACTIVE SEED LOCALIZATION: SHX6424

## 2021-04-08 LAB — POCT PREGNANCY, URINE: Preg Test, Ur: NEGATIVE

## 2021-04-08 SURGERY — BREAST LUMPECTOMY WITH RADIOACTIVE SEED LOCALIZATION
Anesthesia: General | Site: Breast | Laterality: Left

## 2021-04-08 MED ORDER — ONDANSETRON HCL 4 MG/2ML IJ SOLN
INTRAMUSCULAR | Status: DC | PRN
  Administered 2021-04-08: 4 mg via INTRAVENOUS

## 2021-04-08 MED ORDER — ROCURONIUM BROMIDE 10 MG/ML (PF) SYRINGE
PREFILLED_SYRINGE | INTRAVENOUS | Status: AC
  Filled 2021-04-08: qty 10

## 2021-04-08 MED ORDER — FENTANYL CITRATE (PF) 100 MCG/2ML IJ SOLN: 25.0000 ug | INTRAMUSCULAR | Status: DC | PRN

## 2021-04-08 MED ORDER — FENTANYL CITRATE (PF) 100 MCG/2ML IJ SOLN
INTRAMUSCULAR | Status: AC
  Filled 2021-04-08: qty 2

## 2021-04-08 MED ORDER — LACTATED RINGERS IV SOLN: INTRAVENOUS | Status: DC

## 2021-04-08 MED ORDER — DIPHENHYDRAMINE HCL 50 MG/ML IJ SOLN
INTRAMUSCULAR | Status: AC
  Filled 2021-04-08: qty 1

## 2021-04-08 MED ORDER — PROPOFOL 500 MG/50ML IV EMUL
INTRAVENOUS | Status: AC
  Filled 2021-04-08: qty 50

## 2021-04-08 MED ORDER — FENTANYL CITRATE (PF) 100 MCG/2ML IJ SOLN
INTRAMUSCULAR | Status: DC | PRN
  Administered 2021-04-08: 75 ug via INTRAVENOUS
  Administered 2021-04-08: 25 ug via INTRAVENOUS

## 2021-04-08 MED ORDER — LIDOCAINE 2% (20 MG/ML) 5 ML SYRINGE
INTRAMUSCULAR | Status: AC
  Filled 2021-04-08: qty 5

## 2021-04-08 MED ORDER — DEXAMETHASONE SODIUM PHOSPHATE 10 MG/ML IJ SOLN
INTRAMUSCULAR | Status: AC
  Filled 2021-04-08: qty 1

## 2021-04-08 MED ORDER — ACETAMINOPHEN 500 MG PO TABS
1000.0000 mg | ORAL_TABLET | ORAL | Status: AC
  Administered 2021-04-08: 1000 mg via ORAL

## 2021-04-08 MED ORDER — BUPIVACAINE-EPINEPHRINE 0.25% -1:200000 IJ SOLN
INTRAMUSCULAR | Status: DC | PRN
  Administered 2021-04-08: 10 mL

## 2021-04-08 MED ORDER — CHLORHEXIDINE GLUCONATE CLOTH 2 % EX PADS: 6.0000 | MEDICATED_PAD | Freq: Once | CUTANEOUS | Status: DC

## 2021-04-08 MED ORDER — VANCOMYCIN HCL IN DEXTROSE 1-5 GM/200ML-% IV SOLN
INTRAVENOUS | Status: AC
  Filled 2021-04-08: qty 200

## 2021-04-08 MED ORDER — PROPOFOL 10 MG/ML IV BOLUS
INTRAVENOUS | Status: AC
  Filled 2021-04-08: qty 20

## 2021-04-08 MED ORDER — ACETAMINOPHEN 500 MG PO TABS
ORAL_TABLET | ORAL | Status: AC
  Filled 2021-04-08: qty 2

## 2021-04-08 MED ORDER — SCOPOLAMINE 1 MG/3DAYS TD PT72
MEDICATED_PATCH | TRANSDERMAL | Status: AC
  Filled 2021-04-08: qty 1

## 2021-04-08 MED ORDER — MIDAZOLAM HCL 2 MG/2ML IJ SOLN
INTRAMUSCULAR | Status: AC
  Filled 2021-04-08: qty 2

## 2021-04-08 MED ORDER — ONDANSETRON HCL 4 MG/2ML IJ SOLN
INTRAMUSCULAR | Status: AC
  Filled 2021-04-08: qty 2

## 2021-04-08 MED ORDER — PROMETHAZINE HCL 25 MG/ML IJ SOLN: 6.2500 mg | INTRAMUSCULAR | Status: DC | PRN

## 2021-04-08 MED ORDER — SCOPOLAMINE 1 MG/3DAYS TD PT72
1.0000 | MEDICATED_PATCH | TRANSDERMAL | Status: DC
  Administered 2021-04-08: 1.5 mg via TRANSDERMAL

## 2021-04-08 MED ORDER — VANCOMYCIN HCL IN DEXTROSE 1-5 GM/200ML-% IV SOLN
1000.0000 mg | INTRAVENOUS | Status: AC
  Administered 2021-04-08: 1000 mg via INTRAVENOUS

## 2021-04-08 MED ORDER — DIPHENHYDRAMINE HCL 50 MG/ML IJ SOLN
INTRAMUSCULAR | Status: DC | PRN
  Administered 2021-04-08: 25 mg via INTRAVENOUS

## 2021-04-08 MED ORDER — MIDAZOLAM HCL 5 MG/5ML IJ SOLN
INTRAMUSCULAR | Status: DC | PRN
  Administered 2021-04-08: 2 mg via INTRAVENOUS

## 2021-04-08 MED ORDER — PROPOFOL 10 MG/ML IV BOLUS
INTRAVENOUS | Status: DC | PRN
  Administered 2021-04-08: 150 mg via INTRAVENOUS
  Administered 2021-04-08 (×3): 50 mg via INTRAVENOUS

## 2021-04-08 MED ORDER — PROPOFOL 500 MG/50ML IV EMUL
INTRAVENOUS | Status: DC | PRN
  Administered 2021-04-08: 150 ug/kg/min via INTRAVENOUS

## 2021-04-08 MED ORDER — DEXAMETHASONE SODIUM PHOSPHATE 4 MG/ML IJ SOLN
INTRAMUSCULAR | Status: DC | PRN
  Administered 2021-04-08: 10 mg via INTRAVENOUS

## 2021-04-08 MED ORDER — SUGAMMADEX SODIUM 200 MG/2ML IV SOLN
INTRAVENOUS | Status: DC | PRN
  Administered 2021-04-08: 200 mg via INTRAVENOUS

## 2021-04-08 MED ORDER — LIDOCAINE 2% (20 MG/ML) 5 ML SYRINGE
INTRAMUSCULAR | Status: DC | PRN
  Administered 2021-04-08: 100 mg via INTRAVENOUS

## 2021-04-08 MED ORDER — ROCURONIUM BROMIDE 100 MG/10ML IV SOLN
INTRAVENOUS | Status: DC | PRN
  Administered 2021-04-08: 50 mg via INTRAVENOUS

## 2021-04-08 SURGICAL SUPPLY — 43 items
APL PRP STRL LF DISP 70% ISPRP (MISCELLANEOUS) ×1
APL SKNCLS STERI-STRIP NONHPOA (GAUZE/BANDAGES/DRESSINGS) ×1
APPLIER CLIP 9.375 MED OPEN (MISCELLANEOUS) ×3
APR CLP MED 9.3 20 MLT OPN (MISCELLANEOUS) ×1
BENZOIN TINCTURE PRP APPL 2/3 (GAUZE/BANDAGES/DRESSINGS) ×3 IMPLANT
BLADE HEX COATED 2.75 (ELECTRODE) ×3 IMPLANT
BLADE SURG 15 STRL LF DISP TIS (BLADE) ×1 IMPLANT
BLADE SURG 15 STRL SS (BLADE) ×3
CHLORAPREP W/TINT 26 (MISCELLANEOUS) ×3 IMPLANT
CLIP APPLIE 9.375 MED OPEN (MISCELLANEOUS) ×1 IMPLANT
CLOSURE WOUND 1/2 X4 (GAUZE/BANDAGES/DRESSINGS) ×1
COVER BACK TABLE 60X90IN (DRAPES) ×3 IMPLANT
COVER MAYO STAND STRL (DRAPES) ×3 IMPLANT
COVER PROBE W GEL 5X96 (DRAPES) ×3 IMPLANT
DRAPE LAPAROTOMY 100X72 PEDS (DRAPES) ×3 IMPLANT
DRAPE UTILITY XL STRL (DRAPES) ×3 IMPLANT
DRSG TEGADERM 4X4.75 (GAUZE/BANDAGES/DRESSINGS) ×3 IMPLANT
ELECT REM PT RETURN 9FT ADLT (ELECTROSURGICAL) ×3
ELECTRODE REM PT RTRN 9FT ADLT (ELECTROSURGICAL) ×1 IMPLANT
GAUZE SPONGE 4X4 12PLY STRL LF (GAUZE/BANDAGES/DRESSINGS) ×2 IMPLANT
GLOVE SURG ENC MOIS LTX SZ7 (GLOVE) ×3 IMPLANT
GLOVE SURG POLYISO LF SZ6 (GLOVE) ×2 IMPLANT
GLOVE SURG UNDER POLY LF SZ6.5 (GLOVE) ×2 IMPLANT
GLOVE SURG UNDER POLY LF SZ7 (GLOVE) ×2 IMPLANT
GLOVE SURG UNDER POLY LF SZ7.5 (GLOVE) ×3 IMPLANT
GOWN STRL REUS W/ TWL LRG LVL3 (GOWN DISPOSABLE) ×2 IMPLANT
GOWN STRL REUS W/TWL LRG LVL3 (GOWN DISPOSABLE) ×9
KIT MARKER MARGIN INK (KITS) ×3 IMPLANT
NDL HYPO 25X1 1.5 SAFETY (NEEDLE) ×1 IMPLANT
NEEDLE HYPO 25X1 1.5 SAFETY (NEEDLE) ×3 IMPLANT
NS IRRIG 1000ML POUR BTL (IV SOLUTION) ×3 IMPLANT
PACK BASIN DAY SURGERY FS (CUSTOM PROCEDURE TRAY) ×3 IMPLANT
PENCIL SMOKE EVACUATOR (MISCELLANEOUS) ×3 IMPLANT
SLEEVE SCD COMPRESS KNEE MED (STOCKING) ×3 IMPLANT
SPONGE T-LAP 18X18 ~~LOC~~+RFID (SPONGE) IMPLANT
SPONGE T-LAP 4X18 ~~LOC~~+RFID (SPONGE) ×3 IMPLANT
STRIP CLOSURE SKIN 1/2X4 (GAUZE/BANDAGES/DRESSINGS) ×2 IMPLANT
SUT MON AB 4-0 PC3 18 (SUTURE) ×3 IMPLANT
SUT VIC AB 3-0 SH 27 (SUTURE) ×3
SUT VIC AB 3-0 SH 27X BRD (SUTURE) ×1 IMPLANT
SYR CONTROL 10ML LL (SYRINGE) ×3 IMPLANT
TOWEL GREEN STERILE FF (TOWEL DISPOSABLE) ×3 IMPLANT
TRAY FAXITRON CT DISP (TRAY / TRAY PROCEDURE) ×3 IMPLANT

## 2021-04-08 NOTE — Op Note (Signed)
Pre-op Diagnosis:  Left breast intraductal papilloma Post-op Diagnosis: same Procedure:  left radioactive seed localized lumpectomy Surgeon:  Jaki Steptoe K. Anesthesia:  GEN - LMA Indications:  This is a 45 year old female that I operated on in 2012 for a left breast papilloma and bloody nipple discharge.  The pathology was benign.     Recently, she underwent routine screening mammogram.  She has also noted some left lateral breast tenderness.  She was noted to have multiple cysts in her breast.  She also was found to have a solid mass at 0600 on the left 2 cmfn measuring 7 x 4 x 4 mm.  Biopsy revealed intraductal papilloma.  She presents now to discuss excision.     She had a previous right breast biopsy which was benign, as well as a laparoscopic cholecystectomy by Dr. Ninfa Linden.  Description of procedure: The patient is brought to the operating room placed in supine position on the operating room table. After an adequate level of general anesthesia was obtained, her left breast was prepped with ChloraPrep and draped in sterile fashion. A timeout was taken to ensure the proper patient and proper procedure. We interrogated the breast with the neoprobe. We made a circumareolar incision around the lower side of the nipple after infiltrating with 0.25% Marcaine. Dissection was carried down in the breast tissue with cautery. We used the neoprobe to guide [place] towards the radioactive seed. We excised an area of tissue around the radioactive seed 1.5 cm in diameter. The specimen was removed and was oriented with a paint kit. Specimen mammogram showed the radioactive seed as well as the biopsy clip within the specimen. This was sent for pathologic examination. There is no residual radioactivity within the biopsy cavity. We inspected carefully for hemostasis. The wound was thoroughly irrigated. The wound was closed with a deep layer of 3-0 Vicryl and a subcuticular layer of 4-0 Monocryl. Benzoin Steri-Strips were  applied. The patient was then extubated and brought to the recovery room in stable condition. All sponge, instrument, and needle counts are correct.  Imogene Burn. Georgette Dover, MD, [facility] Surgery  General/ Trauma Surgery  04/08/2021 11:01 AM

## 2021-04-08 NOTE — Anesthesia Procedure Notes (Signed)
Procedure Name: Intubation Date/Time: 04/08/2021 10:24 AM Performed by: Ezequiel Kayser, CRNA Pre-anesthesia Checklist: Patient identified, Emergency Drugs available, Suction available and Patient being monitored Patient Re-evaluated:Patient Re-evaluated prior to induction Oxygen Delivery Method: Circle System Utilized Preoxygenation: Pre-oxygenation with 100% oxygen Induction Type: IV induction Ventilation: Mask ventilation without difficulty Laryngoscope Size: Mac and 3 Grade View: Grade I Tube type: Oral Tube size: 7.0 mm Number of attempts: 1 Airway Equipment and Method: Stylet and Oral airway Placement Confirmation: ETT inserted through vocal cords under direct vision, positive ETCO2 and breath sounds checked- equal and bilateral Secured at: 21 cm Tube secured with: Tape Dental Injury: Teeth and Oropharynx as per pre-operative assessment

## 2021-04-08 NOTE — H&P (Signed)
Subjective    Chief Complaint: Breast Mass (Intraductal papilloma left breast)       History of Present Illness: Sandra [name] is a 45 y.o. female who is seen today as an office consultation at the request of Dr. Glade Lloyd for evaluation of Breast Mass (Intraductal papilloma left breast) .   This is a 45 year old female that I operated on in 2012 for a left breast papilloma and bloody nipple discharge.  The pathology was benign.     Recently, she underwent routine screening mammogram.  She has also noted some left lateral breast tenderness.  She was noted to have multiple cysts in her breast.  She also was found to have a solid mass at 0600 on the left 2 cmfn measuring 7 x 4 x 4 mm.  Biopsy revealed intraductal papilloma.  She presents now to discuss excision.     She had a previous right breast biopsy which was benign, as well as a laparoscopic cholecystectomy by Dr. Ninfa Linden.   Review of Systems: A complete review of systems was obtained from the patient.  I have reviewed this information and discussed as appropriate with the patient.  See HPI as well for other ROS.   Review of Systems  Constitutional: Negative.   HENT: Negative.   Eyes: Negative.   Respiratory: Negative.   Cardiovascular: Negative.   Gastrointestinal: Negative.   Genitourinary: Negative.   Musculoskeletal: Negative.   Skin: Negative.   Neurological: Negative.   Endo/Heme/Allergies: Negative.   Psychiatric/Behavioral: Negative.         Medical History: History reviewed. No pertinent past medical history.      Patient Active Problem List  Diagnosis   Cyst of breast   Esophageal reflux   Fibromyalgia   Headache syndrome           Past Surgical History:  Procedure Laterality Date   CESAREAN SECTION       CHOLECYSTECTOMY       HERNIA REPAIR               Allergies  Allergen Reactions   Penicillins Other (See Comments), Rash and Unknown      Has patient had a PCN reaction causing immediate  rash, facial/tongue/throat swelling, SOB or lightheadedness with hypotension: yes Has patient had a PCN reaction causing severe rash involving mucus membranes or skin necrosis: no Has patient had a PCN reaction that required hospitalization: no Has patient had a PCN reaction occurring within the last 10 years: no If all of the above answers are "NO", then may proceed with Cephalosporin use. PATIENT HAS HAD A PCN REACTION WITH IMMEDIATE RASH, FACIAL/TONGUE/THROAT SWELLING, SOB, OR LIGHTHEADEDNESS WITH HYPOTENSION:  #  #  #  YES  #  #  #   Has patient had a PCN reaction causing severe rash involving mucus membranes or skin necrosis: no Has patient had a PCN reaction that required hospitalization: no Has patient had a PCN reaction occurring within the last 10 years: no        No current outpatient medications on file prior to visit.    No current facility-administered medications on file prior to visit.      History reviewed. No pertinent family history.    Social History       Tobacco Use  Smoking Status Never Smoker  Smokeless Tobacco Never Used      Social History        Socioeconomic History   Marital status: Social worker  Tobacco  Use   Smoking status: Never Smoker   Smokeless tobacco: Never Used  Vaping Use   Vaping Use: Never used  Substance and Sexual Activity   Alcohol use: Yes   Drug use: Never      Objective:         Vitals:     BP: 118/72  Pulse: 77  Temp: 36.3 C (97.4 F)  SpO2: 100%  Weight: 96.9 kg (213 lb 9.6 oz)  Height: 157.5 cm ('5\' 2"'$ )    Body mass index is 39.07 kg/m.   Physical Exam    Constitutional:  WDWN in NAD, conversant, no obvious deformities; lying in bed comfortably Eyes:  Pupils equal, round; sclera anicteric; moist conjunctiva; no lid lag HENT:  Oral mucosa moist; good dentition  Neck:  No masses palpated, trachea midline; no thyromegaly Lungs:  CTA bilaterally; normal respiratory effort Breasts:  symmetric, no nipple  changes; no palpable masses or lymphadenopathy on either side; left fibrocystic changes in lateral breast CV:  Regular rate and rhythm; no murmurs; extremities well-perfused with no edema Abd:  +bowel sounds, soft, non-tender, no palpable organomegaly; no palpable hernias Musc:  Unable to assess gait; no apparent clubbing or cyanosis in extremities Lymphatic:  No palpable cervical or axillary lymphadenopathy Skin:  Warm, dry; no sign of jaundice Psychiatric - alert and oriented x 4; calm mood and affect     Labs, Imaging and Diagnostic Testing: Diagnosis Breast, left, needle core biopsy, 6 o'clock, ribbin clip - FIBROCYSTIC CHANGES WITH APOCRINE METAPLASIA AND FOCAL FEATURES SUGGESTIVE OF INTRADUCTAL PAPILLOMA - NO ATYPIA OR MALIGNANCY IDENTIFIED Microscopic Comment SMM highlights the presence of a myoepithelial cell layer around the ductal proliferations. Cytokeratin 5/6 and ER shows mosaic pattern expression in the focus concerning for an intraductal papilloma. Thressa Sheller MD Pathologist, Electronic Signature (Case signed 01/31/2021)     CLINICAL DATA:  Patient recalled from screening for left breast asymmetry and calcifications.   EXAM: DIGITAL DIAGNOSTIC UNILATERAL LEFT MAMMOGRAM WITH TOMOSYNTHESIS AND CAD; ULTRASOUND LEFT BREAST LIMITED   TECHNIQUE: Left digital diagnostic mammography and breast tomosynthesis was performed. The images were evaluated with computer-aided detection.; Targeted ultrasound examination of the left breast was performed   COMPARISON:  Previous exam(s).   ACR Breast Density Category c: The breast tissue is heterogeneously dense, which may obscure small masses.   FINDINGS: Persistent low-density oval circumscribed mass within the upper-outer left breast. Additionally within the 6 o'clock position left breast there is a partially obscured mass. Magnification views upper outer left breast demonstrate a single calcification which appears to layer  on true lateral view, most consistent with milk of calcium.   Targeted ultrasound is performed, showing multiple cysts and clusters of cysts within the upper-outer left breast including a 4 mm cyst 2 o'clock position 3 cm from nipple and a 7 mm cyst 3 o'clock position 4 cm from nipple.   Within the left breast 6 o'clock position 2 cm from nipple there is a 7 x 4 x 4 mm solid and cystic mass.   No left axillary adenopathy.   IMPRESSION: Indeterminate solid and cystic left breast mass 6 o'clock position.   Fibrocystic changes upper outer left breast.   RECOMMENDATION: Ultrasound-guided core needle biopsy solid and cystic left breast mass 6 o'clock position.   I have discussed the findings and recommendations with the patient. If applicable, a reminder letter will be sent to the patient regarding the next appointment.   BI-RADS CATEGORY  4: Suspicious.     Electronically  Signed   By: Lovey Newcomer M.D.   On: 01/28/2021 10:01     Assessment and Plan:  Diagnoses and all orders for this visit:   Intraductal papilloma of breast, left   Left radioactive seed localized lumpectomy.  The surgical procedure has been discussed with the patient.  Potential risks, benefits, alternative treatments, and expected outcomes have been explained.  All of the patient's questions at this time have been answered.  The likelihood of reaching the patient's treatment goal is good.  The patient understand the proposed surgical procedure and wishes to proceed.    Imogene Burn. Georgette Dover, MD, [facility]  General Surgery   04/08/2021 9:18 AM

## 2021-04-08 NOTE — Transfer of Care (Signed)
Immediate Anesthesia Transfer of Care Note  Patient: Sandra [name]  Procedure(s) Performed: LEFT BREAST LUMPECTOMY WITH RADIOACTIVE SEED LOCALIZATION (Left: Breast)  Patient Location: PACU  Anesthesia Type:General  Level of Consciousness: drowsy  Airway & Oxygen Therapy: Patient Spontanous Breathing and Patient connected to face mask oxygen  Post-op Assessment: Report given to RN and Post -op Vital signs reviewed and stable  Post vital signs: Reviewed and stable  Last Vitals:  Vitals Value Taken Time  BP 95/59 04/08/21 1115  Temp    Pulse 81 04/08/21 1117  Resp 23 04/08/21 1117  SpO2 100 % 04/08/21 1117  Vitals shown include unvalidated device [name].  Last Pain:  Vitals:   04/08/21 0914  TempSrc: Oral  PainSc: 0-No pain         Complications: No notable events documented.

## 2021-04-08 NOTE — Anesthesia Preprocedure Evaluation (Signed)
Anesthesia Evaluation  Patient identified by MRN, date of birth, ID band Patient awake    Reviewed: Allergy & Precautions, NPO status , Patient's Chart, lab work & pertinent test results  History of Anesthesia Complications (+) PONV and history of anesthetic complications  Airway Mallampati: II  TM Distance: >3 FB Neck ROM: Full    Dental  (+) Teeth Intact, Dental Advisory Given   Pulmonary neg pulmonary ROS,    Pulmonary exam normal breath sounds clear to auscultation       Cardiovascular negative cardio ROS Normal cardiovascular exam Rhythm:Regular Rate:Normal     Neuro/Psych  Headaches,    GI/Hepatic Neg liver ROS, GERD  Poorly Controlled,  Endo/Other  Morbid obesitySarcoidosis   Renal/GU negative Renal ROS Bladder dysfunction      Musculoskeletal  (+) Arthritis ,   Abdominal   Peds  Hematology negative hematology ROS (+)   Anesthesia Other Findings Day of surgery medications reviewed with the patient.  Reproductive/Obstetrics                             Anesthesia Physical Anesthesia Plan  ASA: 3  Anesthesia Plan: General   Post-op Pain Management:    Induction: Intravenous  PONV Risk Score and Plan: 4 or greater and Diphenhydramine, Scopolamine patch - Pre-op, Midazolam, TIVA, Dexamethasone and Ondansetron  Airway Management Planned: Oral ETT  Additional Equipment:   Intra-op Plan:   Post-operative Plan: Extubation in OR  Informed Consent: I have reviewed the patients History and Physical, chart, labs and discussed the procedure including the risks, benefits and alternatives for the proposed anesthesia with the patient or authorized representative who has indicated his/her understanding and acceptance.     Dental advisory given  Plan Discussed with: CRNA  Anesthesia Plan Comments:         Anesthesia Quick Evaluation

## 2021-04-08 NOTE — Discharge Instructions (Addendum)
[facility] Office Phone Number [phone]  BREAST BIOPSY/ PARTIAL MASTECTOMY: POST OP INSTRUCTIONS  Always review your discharge instruction sheet given to you by the facility where your surgery was performed.  IF YOU HAVE DISABILITY OR FAMILY LEAVE FORMS, YOU MUST BRING THEM TO THE OFFICE FOR PROCESSING.  DO NOT GIVE THEM TO YOUR DOCTOR.  A prescription for pain medication may be given to you upon discharge.  Take your pain medication as prescribed, if needed.  If narcotic pain medicine is not needed, then you may take acetaminophen (Tylenol) or ibuprofen (Advil) as needed. Take your usually prescribed medications unless otherwise directed If you need a refill on your pain medication, please contact your pharmacy.  They will contact our office to request authorization.  Prescriptions will not be filled after 5pm or on week-ends. You should eat very light the first 24 hours after surgery, such as soup, crackers, pudding, etc.  Resume your normal diet the day after surgery. Most patients will experience some swelling and bruising in the breast.  Ice packs and a good support bra will help.  Swelling and bruising can take several days to resolve.  It is common to experience some constipation if taking pain medication after surgery.  Increasing fluid intake and taking a stool softener will usually help or prevent this problem from occurring.  A mild laxative (Milk of Magnesia or Miralax) should be taken according to package directions if there are no bowel movements after 48 hours. Unless discharge instructions indicate otherwise, you may remove your bandages 24-48 hours after surgery, and you may shower at that time.  You may have steri-strips (small skin tapes) in place directly over the incision.  These strips should be left on the skin for 7-10 days.  If your surgeon used skin glue on the incision, you may shower in 24 hours.  The glue will flake off over the next 2-3 weeks.  Any  sutures or staples will be removed at the office during your follow-up visit. ACTIVITIES:  You may resume regular daily activities (gradually increasing) beginning the next day.  Wearing a good support bra or sports bra minimizes pain and swelling.  You may have sexual intercourse when it is comfortable. You may drive when you no longer are taking prescription pain medication, you can comfortably wear a seatbelt, and you can safely maneuver your car and apply brakes. RETURN TO WORK:  ______________________________________________________________________________________ Dennis Bast should see your doctor in the office for a follow-up appointment approximately two weeks after your surgery.  Your doctor's nurse will typically make your follow-up appointment when she calls you with your pathology report.  Expect your pathology report 2-3 business days after your surgery.  You may call to check if you do not hear from [place] after three days. OTHER INSTRUCTIONS: _______________________________________________________________________________________________ _____________________________________________________________________________________________________________________________________ _____________________________________________________________________________________________________________________________________ _____________________________________________________________________________________________________________________________________  WHEN TO CALL YOUR DOCTOR: Fever over 101.0 Nausea and/or vomiting. Extreme swelling or bruising. Continued bleeding from incision. Increased pain, redness, or drainage from the incision.  The clinic staff is available to answer your questions during regular business hours.  Please don't hesitate to call and ask to speak to one of the nurses for clinical concerns.  If you have a medical emergency, go to the nearest emergency room or call 911.  A surgeon from [facility] is always on call at the hospital.  For further questions, please visit [email or web address]   No tylenol until after 3:30pm today.   Post Anesthesia Home Care Instructions  Activity: Get  plenty of rest for the remainder of the day. A responsible individual must stay with you for 24 hours following the procedure.  For the next 24 hours, DO NOT: -Drive a car -Paediatric nurse -Drink alcoholic beverages -Take any medication unless instructed by your physician -Make any legal decisions or sign important papers.  Meals: Start with liquid foods such as gelatin or soup. Progress to regular foods as tolerated. Avoid greasy, spicy, heavy foods. If nausea and/or vomiting occur, drink only clear liquids until the nausea and/or vomiting subsides. Call your physician if vomiting continues.  Special Instructions/Symptoms: Your throat may feel dry or sore from the anesthesia or the breathing tube placed in your throat during surgery. If this causes discomfort, gargle with warm salt water. The discomfort should disappear within 24 hours.  If you had a scopolamine patch placed behind your ear for the management of post- operative nausea and/or vomiting:  1. The medication in the patch is effective for 72 hours, after which it should be removed.  Wrap patch in a tissue and discard in the trash. Wash hands thoroughly with soap and water. 2. You may remove the patch earlier than 72 hours if you experience unpleasant side effects which may include dry mouth, dizziness or visual disturbances. 3. Avoid touching the patch. Wash your hands with soap and water after contact with the patch.

## 2021-04-08 NOTE — Anesthesia Postprocedure Evaluation (Signed)
Anesthesia Post Note  Patient: Sandra [name]  Procedure(s) Performed: LEFT BREAST LUMPECTOMY WITH RADIOACTIVE SEED LOCALIZATION (Left: Breast)     Patient location during evaluation: PACU Anesthesia Type: General Level of consciousness: awake and alert Pain management: pain level controlled Vital Signs Assessment: post-procedure vital signs reviewed and stable Respiratory status: spontaneous breathing, nonlabored ventilation and respiratory function stable Cardiovascular status: blood pressure returned to baseline and stable Postop Assessment: no apparent nausea or vomiting Anesthetic complications: no   No notable events documented.  Last Vitals:  Vitals:   04/08/21 1138 04/08/21 1145  BP: 90/68 101/84  Pulse: 69 70  Resp: 18 18  Temp:  36.9 C  SpO2: 97% 98%    Last Pain:  Vitals:   04/08/21 1145  TempSrc:   PainSc: 0-No pain                 Catalina Gravel

## 2021-04-10 ENCOUNTER — Encounter (HOSPITAL_BASED_OUTPATIENT_CLINIC_OR_DEPARTMENT_OTHER): Payer: Self-pay | Admitting: Surgery

## 2021-04-11 LAB — SURGICAL PATHOLOGY

## 2021-04-16 ENCOUNTER — Ambulatory Visit: Payer: 59 | Admitting: Obstetrics and Gynecology

## 2021-04-17 DIAGNOSIS — Z20822 Contact with and (suspected) exposure to covid-19: Secondary | ICD-10-CM | POA: Diagnosis not present

## 2021-04-18 ENCOUNTER — Ambulatory Visit: Payer: 59 | Admitting: Medical

## 2021-04-18 ENCOUNTER — Other Ambulatory Visit: Payer: Self-pay

## 2021-04-18 VITALS — BP 130/88 | HR 89 | Wt 216.8 lb

## 2021-04-18 DIAGNOSIS — G4700 Insomnia, unspecified: Secondary | ICD-10-CM | POA: Diagnosis not present

## 2021-04-18 DIAGNOSIS — D869 Sarcoidosis, unspecified: Secondary | ICD-10-CM | POA: Diagnosis not present

## 2021-04-18 DIAGNOSIS — L299 Pruritus, unspecified: Secondary | ICD-10-CM | POA: Insufficient documentation

## 2021-04-18 MED ORDER — CLONAZEPAM 0.5 MG PO TABS
0.5000 mg | ORAL_TABLET | Freq: Every evening | ORAL | 0 refills | Status: DC | PRN
Start: 2021-04-18 — End: 2021-08-11

## 2021-04-18 MED ORDER — TRIAMCINOLONE ACETONIDE 0.1 % EX CREA: 1.0000 "application " | TOPICAL_CREAM | Freq: Two times a day (BID) | CUTANEOUS | 0 refills | Status: DC

## 2021-04-18 MED ORDER — HYDROXYZINE PAMOATE 25 MG PO CAPS
25.0000 mg | ORAL_CAPSULE | Freq: Three times a day (TID) | ORAL | 0 refills | Status: DC | PRN
Start: 2021-04-18 — End: 2021-08-11

## 2021-04-18 NOTE — Progress Notes (Signed)
Subjective:  Sandra [name] is a 45 y.o. female who presents for Chief Complaint  Patient presents with   palms    Palms and bottom of feet itching since Tuesday. Can't sleep. Constant itch     Here for itching.  Started 2-3 days ago.  Itching nonstop of hands and feet, soles and palms.  No itching elsewhere.  Itching so bad can't sleep the last 2 days.   Tried Vaseline but that worsened the itch.  Tried other lotion without help.  Even benadryl not helping  She has hx/o sarcoidosis so not sure if its playing a role  No recent new foods, creams or other exposures  She had recent surgery for breast biopsy Tuesday 2 days ago and follow up with surgeon today was ok, no concerned for infection  She notes that she was taking extra B12 a few months ago when her B12 labs were found to be elevated.  No other aggravating or relieving factors.    No other c/o.  The following portions of the patient's history were reviewed and updated as appropriate: allergies, current medications, past family history, past medical history, past social history, past surgical history and problem list.  ROS Otherwise as in subjective above  Objective: BP 130/88   Pulse 89   Wt 216 lb 12.8 oz (98.3 kg)   BMI 39.65 kg/m   General appearance: alert, no distress, well developed, well nourished HEENT: normocephalic, sclerae anicteric, conjunctiva pink and moist, TMs pearly, nares patent, no discharge or erythema, pharynx normal Oral cavity: MMM, no lesions Neck: supple, no lymphadenopathy, no thyromegaly, no masses Heart: RRR, normal S1, S2, no murmurs Lungs: CTA bilaterally, no wheezes, rhonchi, or rales Pulses: 2+ radial pulses, 2+ pedal pulses, normal cap refill Ext: no edema Skin: no obvious rash or defmority of palms, soles or other     Assessment: Encounter Diagnoses  Name Primary?   Pruritic dermatitis Yes   Sarcoidosis    Insomnia, unspecified type      Plan: Discussed concerns.  Begin  hydroxyzine, begin topical cream.   For the next few days can also use the clonazepam as aid for sleep and calming anxiety about the itching   Discussed proper use of medications  Given unusual symptoms and prior sarcoidosis, labs as below today   Sandra [name] was seen today for palms.  Diagnoses and all orders for this visit:  Pruritic dermatitis -     Comprehensive metabolic panel -     CBC with Differential/Platelet -     Sedimentation rate -     Vitamin B12  Sarcoidosis -     Comprehensive metabolic panel -     CBC with Differential/Platelet -     Sedimentation rate -     Vitamin B12  Insomnia, unspecified type  Other orders -     triamcinolone cream (KENALOG) 0.1 %; Apply 1 application topically 2 (two) times daily. -     hydrOXYzine (VISTARIL) 25 MG capsule; Take 1 capsule (25 mg total) by mouth every 8 (eight) hours as needed. -     clonazePAM (KLONOPIN) 0.5 MG tablet; Take 1 tablet (0.5 mg total) by mouth at bedtime as needed for anxiety.   Follow up: pending labs

## 2021-04-19 LAB — CBC WITH DIFFERENTIAL/PLATELET
Basophils Absolute: 0.1 10*3/uL (ref 0.0–0.2)
Basos: 1 %
EOS (ABSOLUTE): 0.3 10*3/uL (ref 0.0–0.4)
Eos: 4 %
Hematocrit: 36.1 % (ref 34.0–46.6)
Hemoglobin: 12.3 g/dL (ref 11.1–15.9)
Immature Grans (Abs): 0 10*3/uL (ref 0.0–0.1)
Immature Granulocytes: 0 %
Lymphocytes Absolute: 1.9 10*3/uL (ref 0.7–3.1)
Lymphs: 34 %
MCH: 29.9 pg (ref 26.6–33.0)
MCHC: 34.1 g/dL (ref 31.5–35.7)
MCV: 88 fL (ref 79–97)
Monocytes Absolute: 0.5 10*3/uL (ref 0.1–0.9)
Monocytes: 9 %
Neutrophils Absolute: 3 10*3/uL (ref 1.4–7.0)
Neutrophils: 52 %
Platelets: 267 10*3/uL (ref 150–450)
RBC: 4.12 x10E6/uL (ref 3.77–5.28)
RDW: 12.3 % (ref 11.7–15.4)
WBC: 5.7 10*3/uL (ref 3.4–10.8)

## 2021-04-19 LAB — SEDIMENTATION RATE: Sed Rate: 16 mm/hr (ref 0–32)

## 2021-04-19 LAB — COMPREHENSIVE METABOLIC PANEL
ALT: 9 IU/L (ref 0–32)
AST: 14 IU/L (ref 0–40)
Albumin/Globulin Ratio: 1.8 (ref 1.2–2.2)
Albumin: 4.7 g/dL (ref 3.8–4.8)
Alkaline Phosphatase: 55 IU/L (ref 44–121)
BUN/Creatinine Ratio: 17 (ref 9–23)
BUN: 12 mg/dL (ref 6–24)
Bilirubin Total: 0.2 mg/dL (ref 0.0–1.2)
CO2: 21 mmol/L (ref 20–29)
Calcium: 9.4 mg/dL (ref 8.7–10.2)
Chloride: 101 mmol/L (ref 96–106)
Creatinine, Ser: 0.72 mg/dL (ref 0.57–1.00)
Globulin, Total: 2.6 g/dL (ref 1.5–4.5)
Glucose: 100 mg/dL — ABNORMAL HIGH (ref 65–99)
Potassium: 4.3 mmol/L (ref 3.5–5.2)
Sodium: 139 mmol/L (ref 134–144)
Total Protein: 7.3 g/dL (ref 6.0–8.5)
eGFR: 106 mL/min/{1.73_m2} (ref >59)

## 2021-04-19 LAB — VITAMIN B12: Vitamin B-12: 764 pg/mL (ref 232–1245)

## 2021-04-22 ENCOUNTER — Encounter: Payer: Self-pay | Admitting: Medical

## 2021-04-26 DIAGNOSIS — Z20822 Contact with and (suspected) exposure to covid-19: Secondary | ICD-10-CM | POA: Diagnosis not present

## 2021-04-27 DIAGNOSIS — Z20822 Contact with and (suspected) exposure to covid-19: Secondary | ICD-10-CM | POA: Diagnosis not present

## 2021-04-28 DIAGNOSIS — Z20822 Contact with and (suspected) exposure to covid-19: Secondary | ICD-10-CM | POA: Diagnosis not present

## 2021-04-29 ENCOUNTER — Encounter: Payer: 59 | Admitting: Neurology

## 2021-05-27 DIAGNOSIS — Z20822 Contact with and (suspected) exposure to covid-19: Secondary | ICD-10-CM | POA: Diagnosis not present

## 2021-05-28 DIAGNOSIS — Z20822 Contact with and (suspected) exposure to covid-19: Secondary | ICD-10-CM | POA: Diagnosis not present

## 2021-05-29 ENCOUNTER — Other Ambulatory Visit: Payer: Self-pay

## 2021-05-29 ENCOUNTER — Ambulatory Visit: Payer: 59 | Admitting: Neurology

## 2021-05-29 DIAGNOSIS — R202 Paresthesia of skin: Secondary | ICD-10-CM | POA: Diagnosis not present

## 2021-05-29 DIAGNOSIS — Z20822 Contact with and (suspected) exposure to covid-19: Secondary | ICD-10-CM | POA: Diagnosis not present

## 2021-05-29 NOTE — Procedures (Signed)
[facility]  [place], [address] [place]  [place], [place] [address] Tel: [phone] Fax:  [phone] Test Date:  05/29/2021  Patient: Sandra [name] DOB: 1975/11/09 Physician: Narda Amber, DO  Sex: Female Height: 5\' 2"  Ref Phys: Melony Overly, MD  ID#: [id number]   Technician:    Patient Complaints: This is a 45 year old female referred for evaluation of bilateral feet numbness.  NCV & EMG Findings: Extensive electrodiagnostic testing right lower extremity shows:  Right sural and superficial peroneal sensory responses are within normal limits. Right peroneal and tibial motor responses are within normal limits. Right tibial H reflex study is within normal limits. There is no evidence of active or chronic motor axonal loss changes affecting any of the tested muscles.  Motor unit configuration and recruitment pattern is within normal limits.    Impression: This is a normal study of the right lower extremity.  In particular, there is no evidence of a sensorimotor polyneuropathy or lumbosacral radiculopathy.    Electrodiagnostic testing of the left lower extremity was not performed at patient's request.   ___________________________ Narda Amber, DO    Nerve Conduction Studies Anti Sensory Summary Table   Stim Site NR Peak (ms) Norm Peak (ms) P-T Amp (V) Norm P-T Amp  Right Sup Peroneal Anti Sensory (Ant Lat Mall)  35C  12 cm    2.2 <4.5 16.1 >5  Site 2    2.2  18.7   Right Sural Anti Sensory (Lat Mall)  35C  Calf    2.1 <4.5 20.9 >5   Motor Summary Table   Stim Site NR Onset (ms) Norm Onset (ms) O-P Amp (mV) Norm O-P Amp Site1 Site2 Delta-0 (ms) Dist (cm) Vel (m/s) Norm Vel (m/s)  Right Peroneal Motor (Ext Dig Brev)  35C  Ankle    2.0 <5.5 11.6 >3 B Fib Ankle 6.4 37.0 58 >40  B Fib    8.4  10.5  Poplt B Fib 1.3 8.0 62 >40  Poplt    9.7  10.0         Right Tibial Motor (Abd Hall Brev)  35C  Ankle    3.0 <6.0 6.4 >6 Knee Ankle 7.4 42.0 57 >40   Knee    10.4  2.8          H Reflex Studies   NR H-Lat (ms) Lat Norm (ms) L-R H-Lat (ms)  Right Tibial (Gastroc)  35C     29.39 <35    EMG   Side Muscle Ins Act Fibs Psw Fasc Number Recrt Dur Dur. Amp Amp. Poly Poly. Comment  Right AntTibialis Nml Nml Nml Nml Nml Nml Nml Nml Nml Nml Nml Nml N/A  Right Gastroc Nml Nml Nml Nml Nml Nml Nml Nml Nml Nml Nml Nml N/A  Right Flex Dig Long Nml Nml Nml Nml Nml Nml Nml Nml Nml Nml Nml Nml N/A  Right RectFemoris Nml Nml Nml Nml Nml Nml Nml Nml Nml Nml Nml Nml N/A      Waveforms:          y

## 2021-06-06 DIAGNOSIS — M79671 Pain in right foot: Secondary | ICD-10-CM | POA: Diagnosis not present

## 2021-06-10 ENCOUNTER — Ambulatory Visit (INDEPENDENT_AMBULATORY_CARE_PROVIDER_SITE_OTHER): Payer: 59 | Admitting: Medical

## 2021-06-10 ENCOUNTER — Other Ambulatory Visit: Payer: Self-pay

## 2021-06-10 ENCOUNTER — Encounter: Payer: Self-pay | Admitting: Medical

## 2021-06-10 VITALS — Temp 96.7°F | Wt 215.0 lb

## 2021-06-10 DIAGNOSIS — R059 Cough, unspecified: Secondary | ICD-10-CM | POA: Diagnosis not present

## 2021-06-10 DIAGNOSIS — B379 Candidiasis, unspecified: Secondary | ICD-10-CM | POA: Diagnosis not present

## 2021-06-10 DIAGNOSIS — J329 Chronic sinusitis, unspecified: Secondary | ICD-10-CM

## 2021-06-10 DIAGNOSIS — J40 Bronchitis, not specified as acute or chronic: Secondary | ICD-10-CM | POA: Diagnosis not present

## 2021-06-10 MED ORDER — HYDROCOD POLST-CPM POLST ER 10-8 MG/5ML PO SUER: 5.0000 mL | Freq: Two times a day (BID) | ORAL | 0 refills | Status: DC

## 2021-06-10 MED ORDER — FLUCONAZOLE 150 MG PO TABS: 150.0000 mg | ORAL_TABLET | ORAL | 0 refills | Status: DC

## 2021-06-10 MED ORDER — AZITHROMYCIN 250 MG PO TABS: ORAL_TABLET | ORAL | 0 refills | Status: DC

## 2021-06-10 NOTE — Progress Notes (Signed)
Subjective:     Patient ID: Sandra [name], female   DOB: 17-Mar-1976, 45 y.o.   MRN: [id number]  This visit type was conducted due to national recommendations for restrictions regarding the COVID-19 Pandemic (e.g. social distancing) in an effort to limit this patient's exposure and mitigate transmission in our community.  Due to their co-morbid illnesses, this patient is at least at moderate risk for complications without adequate follow up.  This format is felt to be most appropriate for this patient at this time.    Documentation for virtual audio and video telecommunications through [facility] encounter:  The patient was located at home. The provider was located in the office. The patient did consent to this visit and is aware of possible charges through their insurance for this visit.  The other persons participating in this telemedicine service were none. Time spent on call was 20 minutes and in review of previous records 20 minutes total.  This virtual service is not related to other E/M service within previous 7 days.   HPI Chief Complaint  Patient presents with   cough    Cough x last Thursday, deep cough, chest congestion, phelgm in chest. Runny nose, HA,  negative covid- saturday Also thinks she might have a yeast infection coming on too. - having discharge and itchy x 2 days.    She notes cold symptoms x 5 days.   Went from head to chest.  Hard to get out the phlegm.  Colored green phelgm now, from clear prior.   Started with sore throat.  She notes right rawness in nose for all the blowing and mucous.   No fever.  No body aches or chills.   Has had some wheezing sounds that started yesterday.  No NVD.  She notes alka selzter plus and Theraflu tea.   Has had 1 sick contact.  Overall seems to be getting worse and worse congestion in chest.  She notes vaginal discharge.  Did recently change soaps.  Whitish discharge x 2 days, itching.  Period is suppose to start today. No concern  for STD.  Bought some new underwear recently    No recent infections in last few months.  No recent antibiotic, no new sexual partners.    No other aggravating or relieving factors. No other complaint.   Past Medical History:  Diagnosis Date   Anemia    Arthritis    knees, hips   Complication of anesthesia    nausea and vomiting , dose well with patch   GERD (gastroesophageal reflux disease)    Headache    OAB (overactive bladder)    PONV (postoperative nausea and vomiting)    "patch works well."   Sarcoidosis 2019   Dr. Coralyn Helling   Current Outpatient Medications on File Prior to Visit  Medication Sig Dispense Refill   acetaminophen (TYLENOL) 500 MG tablet Take 1 tablet (500 mg total) by mouth every 6 (six) hours as needed. 30 tablet 0   clonazePAM (KLONOPIN) 0.5 MG tablet Take 1 tablet (0.5 mg total) by mouth at bedtime as needed for anxiety. 15 tablet 0   hydrOXYzine (VISTARIL) 25 MG capsule Take 1 capsule (25 mg total) by mouth every 8 (eight) hours as needed. 30 capsule 0   ibuprofen (ADVIL) 600 MG tablet Take 1 tablet (600 mg total) by mouth every 6 (six) hours as needed. 60 tablet 3   levonorgestrel (MIRENA) 20 MCG/24HR IUD 1 each by Intrauterine route once.     Loteprednol Etabonate (  EYSUVIS) 0.25 % SUSP Apply to eye.     Probiotic Product (PROBIOTIC BLEND PO) Take by mouth 3 x daily with food.     Saline (NASOGEL) GEL Place 1 spray into both nostrils daily as needed (for moisture).     sodium chloride (OCEAN) 0.65 % SOLN nasal spray Place 2 sprays into both nostrils as needed for congestion.     triamcinolone cream (KENALOG) 0.1 % Apply 1 application topically 2 (two) times daily. 30 g 0   No current facility-administered medications on file prior to visit.     Review of Systems As in subjective    Objective:   Physical Exam Due to coronavirus pandemic stay at home measures, patient visit was virtual and they were not examined in person.   Temp (!) 96.7 F (35.9  C)   Wt 215 lb (97.5 kg)   BMI 39.32 kg/m   General well-developed well-nourished no acute distress, mildly ill-appearing, stopped up sounding No labored breathing or witnessed wheezing     Assessment:     Encounter Diagnoses  Name Primary?   Yeast infection Yes   Sinobronchitis    Cough, unspecified type        Plan:     We discussed symptoms and concerns and limitations of virtual consult.  Yeast infection-we discussed symptoms do suggest yeast infection although we cannot prove this without swabs or in person evaluation.  She has had no new sexual partners no concern for STD.  No problems with BV or yeast infection in recent months.  She did recently change soaps and got new different material underwear.  Advise she always wash her new clothes before wearing them.  Begin Diflucan.  She can use this weekly for the next 2 to 3 weeks if needed since she is going to be on antibiotic as well  Sinobronchitis, cough-discussed rest, nasal saline, continue the Alka-Seltzer treatment during the day and can use the Tussionex at night.  Discussed risk and benefits and proper use of medications below.  Caution with sedation on Tussionex.  If not much improved in the next 3 to 5 days or if new or worse symptoms then recheck  Sandra [name] was seen today for cough.  Diagnoses and all orders for this visit:  Yeast infection  Sinobronchitis  Cough, unspecified type  Other orders -     fluconazole (DIFLUCAN) 150 MG tablet; Take 1 tablet (150 mg total) by mouth once a week. -     chlorpheniramine-HYDROcodone (TUSSIONEX PENNKINETIC ER) 10-8 MG/5ML SUER; Take 5 mLs by mouth 2 (two) times daily. -     azithromycin (ZITHROMAX) 250 MG tablet; 2 tablets day 1, then 1 tablet days 2-4  F/u prn

## 2021-06-13 DIAGNOSIS — M47816 Spondylosis without myelopathy or radiculopathy, lumbar region: Secondary | ICD-10-CM | POA: Diagnosis not present

## 2021-06-17 ENCOUNTER — Encounter: Payer: Self-pay | Admitting: Medical

## 2021-06-17 ENCOUNTER — Ambulatory Visit: Payer: 59 | Admitting: Medical

## 2021-06-17 VITALS — BP 130/88 | HR 98 | Wt 221.6 lb

## 2021-06-17 DIAGNOSIS — N760 Acute vaginitis: Secondary | ICD-10-CM

## 2021-06-17 MED ORDER — METRONIDAZOLE 500 MG PO TABS: ORAL_TABLET | ORAL | 0 refills | Status: DC

## 2021-06-17 NOTE — Progress Notes (Signed)
  Subjective:  Sandra [name] is a 45 y.o. female who presents for Chief Complaint  Patient presents with   Vaginal Itching    With yellow and brown discharge     Here for evaluation discharge.  Symptoms times about 3 weeks.  Was cheesy discharge and itching but now more watery yellowish-brown discharge since being on Z-Pak antibiotic recently.  No new partners.  Same partner x10 years, no condom use.  He is circumcised and has no symptoms.  She does note recent change in soap products and underwear new that she bought.  No other symptoms.  Otherwise normal state of health.  Her menstrual period finished last week  No other aggravating or relieving factors.    No other c/o.  The following portions of the patient's history were reviewed and updated as appropriate: allergies, current medications, past family history, past medical history, past social history, past surgical history and problem list.  ROS Otherwise as in subjective above  Objective: BP 130/88 (BP Location: Right Arm, Patient Position: Sitting)   Pulse 98   Wt 221 lb 9.6 oz (100.5 kg)   SpO2 97%   BMI 40.53 kg/m   General appearance: alert, no distress, well developed, well nourished Gyn: normal external genitalia, modreate yellow brown watery discharge, otherwise normal exam, swabs taken, IUD strings in place, exam chaperoned by nurse   Assessment: Encounter Diagnosis  Name Primary?   Acute vaginitis Yes     Plan: She finished her second 150 mg Diflucan today.  She will take her last Diflucan 150 mg in 1 week.  She will begin medication as below.  Discussed hygiene.  We will send out other testing call with results  Sandra [name] was seen today for vaginal itching.  Diagnoses and all orders for this visit:  Acute vaginitis -     Chlamydia/Gonococcus/Trichomonas, NAA  Other orders -     metroNIDAZOLE (FLAGYL) 500 MG tablet; 4 tablets once, don't consume alcohol while taking the medication  Follow up:  pending labs

## 2021-06-20 LAB — CHLAMYDIA/GONOCOCCUS/TRICHOMONAS, NAA
Chlamydia by NAA: NEGATIVE
Gonococcus by NAA: NEGATIVE
Trich vag by NAA: POSITIVE — AB

## 2021-06-23 ENCOUNTER — Other Ambulatory Visit: Payer: Self-pay | Admitting: Medical

## 2021-06-23 MED ORDER — BENZONATATE 200 MG PO CAPS: 200.0000 mg | ORAL_CAPSULE | Freq: Three times a day (TID) | ORAL | 0 refills | Status: DC | PRN

## 2021-06-23 MED ORDER — METRONIDAZOLE 500 MG PO TABS: ORAL_TABLET | ORAL | 0 refills | Status: DC

## 2021-06-23 NOTE — Telephone Encounter (Signed)
Patient called and states she took the flagyl last week but still having symptoms of discharge and wants more meds sent in.

## 2021-06-24 ENCOUNTER — Other Ambulatory Visit: Payer: Self-pay

## 2021-06-24 ENCOUNTER — Telehealth: Payer: 59 | Admitting: Family Medicine

## 2021-06-24 ENCOUNTER — Encounter: Payer: Self-pay | Admitting: Family Medicine

## 2021-06-24 VITALS — Temp 97.8°F | Wt 221.0 lb

## 2021-06-24 DIAGNOSIS — J111 Influenza due to unidentified influenza virus with other respiratory manifestations: Secondary | ICD-10-CM | POA: Diagnosis not present

## 2021-06-24 DIAGNOSIS — J4520 Mild intermittent asthma, uncomplicated: Secondary | ICD-10-CM

## 2021-06-24 MED ORDER — ALBUTEROL SULFATE HFA 108 (90 BASE) MCG/ACT IN AERS
2.0000 | INHALATION_SPRAY | Freq: Four times a day (QID) | RESPIRATORY_TRACT | 0 refills | Status: DC | PRN
Start: 2021-06-24 — End: 2021-10-10

## 2021-06-24 NOTE — Progress Notes (Signed)
Subjective:    Patient ID: Sandra [name], female    DOB: Jan 04, 1976, 45 y.o.   MRN: [id number]  HPI Documentation for virtual audio and video telecommunications through [facility] encounter: The patient was located at home. 2 patient identifiers used.  The provider was located in the office. The patient did consent to this visit and is aware of possible charges through their insurance for this visit. The other persons participating in this telemedicine service were none. Time spent on call was 5 minutes and in review of previous records >18 minutes total for counseling and coordination of care. This virtual service is not related to other E/M service within previous 7 days.  She states that on Thursday she developed chills, temperature to 102, headache, myalgias with chest congestion and coughing.  She recently finished a Z-Pak.  She does have underlying allergies.  Review of Systems     Objective:   Physical Exam Alert and fatigued looking.       Assessment & Plan:  Mild intermittent asthma, unspecified whether complicated - Plan: albuterol (VENTOLIN HFA) 108 (90 Base) MCG/ACT inhaler  Influenza - Plan: albuterol (VENTOLIN HFA) 108 (90 Base) MCG/ACT inhaler She is to continue on Tylenol as well as ibuprofen alternating between the 2, use Robitussin-DM as NyQuil.  She has also been using Tessalon and I will give her albuterol to help with all that.

## 2021-06-26 DIAGNOSIS — Z20822 Contact with and (suspected) exposure to covid-19: Secondary | ICD-10-CM | POA: Diagnosis not present

## 2021-06-27 ENCOUNTER — Telehealth: Payer: Self-pay

## 2021-06-27 DIAGNOSIS — Z20822 Contact with and (suspected) exposure to covid-19: Secondary | ICD-10-CM | POA: Diagnosis not present

## 2021-06-27 NOTE — Telephone Encounter (Signed)
P.A. Amanda Cockayne, tried [facility] and cheapest anywhere is $200

## 2021-06-27 NOTE — Telephone Encounter (Signed)
P.A. Amanda Cockayne was completed and also tried [facility] and cheapest cash pay was $200

## 2021-06-27 NOTE — Telephone Encounter (Signed)
Called pharmacy back and held for 40 minutes still unale to reach anyone

## 2021-06-27 NOTE — Telephone Encounter (Signed)
Was able to get approval, called pharmacy after waiting 45 minutes found out [facility] put in rejection for generic Hycodan not generic Tussionex so still wouldn't go thru.  They sent in another P.A. still coming in as Hycodan so I called [facility] t# [phone] was able to get approved over the phone for 1 month for the quantity exception.  Pt did fill for 70 ml on 06/10/21 & now can get 70 more.  If needs again next month will need to call back in.

## 2021-06-28 DIAGNOSIS — N760 Acute vaginitis: Secondary | ICD-10-CM | POA: Diagnosis not present

## 2021-06-28 DIAGNOSIS — Z20822 Contact with and (suspected) exposure to covid-19: Secondary | ICD-10-CM | POA: Diagnosis not present

## 2021-06-30 ENCOUNTER — Other Ambulatory Visit: Payer: Self-pay | Admitting: Medical

## 2021-06-30 ENCOUNTER — Telehealth: Payer: Self-pay

## 2021-06-30 MED ORDER — METRONIDAZOLE 0.75 % VA GEL: 1.0000 | Freq: Every day | VAGINAL | 0 refills | Status: DC

## 2021-06-30 MED ORDER — FLUCONAZOLE 150 MG PO TABS: 150.0000 mg | ORAL_TABLET | ORAL | 0 refills | Status: DC

## 2021-06-30 MED ORDER — METRONIDAZOLE 1.3 % VA GEL
1.0000 | Freq: Every day | VAGINAL | 0 refills | Status: DC
Start: 2021-06-30 — End: 2021-06-30

## 2021-06-30 NOTE — Telephone Encounter (Signed)
[facility] called that Metronidazole 1.3 not covered by insurance and cost $220, the .75% is covered, ok to switch per Dr. Redmond School

## 2021-06-30 NOTE — Telephone Encounter (Signed)
Called pharmacy & pt has picked up all 140 ml of cough medicine and was covered by insurance.

## 2021-06-30 NOTE — Progress Notes (Signed)
Diflu

## 2021-07-23 ENCOUNTER — Encounter: Payer: 59 | Admitting: Medical

## 2021-07-28 DIAGNOSIS — Z20822 Contact with and (suspected) exposure to covid-19: Secondary | ICD-10-CM | POA: Diagnosis not present

## 2021-07-29 ENCOUNTER — Other Ambulatory Visit: Payer: Self-pay

## 2021-07-29 ENCOUNTER — Telehealth: Payer: 59 | Admitting: Family Medicine

## 2021-07-29 ENCOUNTER — Encounter: Payer: Self-pay | Admitting: Family Medicine

## 2021-07-29 VITALS — Temp 97.6°F | Wt 221.0 lb

## 2021-07-29 DIAGNOSIS — U071 COVID-19: Secondary | ICD-10-CM

## 2021-07-29 DIAGNOSIS — D869 Sarcoidosis, unspecified: Secondary | ICD-10-CM

## 2021-07-29 DIAGNOSIS — Z20822 Contact with and (suspected) exposure to covid-19: Secondary | ICD-10-CM | POA: Diagnosis not present

## 2021-07-29 NOTE — Progress Notes (Signed)
Subjective:    Patient ID: Sandra [name], female    DOB: Apr 16, 1976, 46 y.o.   MRN: [id number]  HPI Documentation for virtual audio and video telecommunications through [facility] encounter: The patient was located at home. 2 patient identifiers used.  The provider was located in the office. The patient did consent to this visit and is aware of possible charges through their insurance for this visit. The other persons participating in this telemedicine service were none. Time spent on call was 5 minutes and in review of previous records >20 minutes total for counseling and coordination of care. This virtual service is not related to other E/M service within previous 7 days.  She states that Friday she developed a slight cough followed the next day by some diaphoresis, increasing cough, nausea and vomiting.  On Sunday she lost her sense of smell and taste and tested positive for COVID.  She has not had any previous vaccines due to her underlying sarcoid and was told not to have them.  She states that today she is feeling slightly better.  Review of Systems     Objective:   Physical Exam Alert and in no distress otherwise not examined       Assessment & Plan:  Sarcoidosis  COVID-19 She is to continue to use an inhaler that she has had from a previous viral infection, use Robitussin-DM.  I will give her a note so she can return to work on Thursday as long she continues to improve.  She will need to wear a mask for another 5 days.  She was comfortable with that.

## 2021-07-30 DIAGNOSIS — Z20822 Contact with and (suspected) exposure to covid-19: Secondary | ICD-10-CM | POA: Diagnosis not present

## 2021-08-07 DIAGNOSIS — U071 COVID-19: Secondary | ICD-10-CM

## 2021-08-07 HISTORY — DX: COVID-19: U07.1

## 2021-08-11 ENCOUNTER — Encounter: Payer: Self-pay | Admitting: Medical

## 2021-08-11 ENCOUNTER — Other Ambulatory Visit: Payer: Self-pay

## 2021-08-11 ENCOUNTER — Ambulatory Visit: Payer: 59 | Admitting: Medical

## 2021-08-11 VITALS — BP 110/74 | HR 75 | Ht 62.5 in | Wt 217.4 lb

## 2021-08-11 DIAGNOSIS — Z975 Presence of (intrauterine) contraceptive device: Secondary | ICD-10-CM | POA: Diagnosis not present

## 2021-08-11 DIAGNOSIS — Z0000 Encounter for general adult medical examination without abnormal findings: Secondary | ICD-10-CM | POA: Diagnosis not present

## 2021-08-11 DIAGNOSIS — D869 Sarcoidosis, unspecified: Secondary | ICD-10-CM | POA: Diagnosis not present

## 2021-08-11 DIAGNOSIS — K219 Gastro-esophageal reflux disease without esophagitis: Secondary | ICD-10-CM

## 2021-08-11 DIAGNOSIS — R0681 Apnea, not elsewhere classified: Secondary | ICD-10-CM

## 2021-08-11 DIAGNOSIS — R202 Paresthesia of skin: Secondary | ICD-10-CM | POA: Diagnosis not present

## 2021-08-11 DIAGNOSIS — N3281 Overactive bladder: Secondary | ICD-10-CM | POA: Diagnosis not present

## 2021-08-11 DIAGNOSIS — Z1211 Encounter for screening for malignant neoplasm of colon: Secondary | ICD-10-CM | POA: Insufficient documentation

## 2021-08-11 DIAGNOSIS — Z8619 Personal history of other infectious and parasitic diseases: Secondary | ICD-10-CM | POA: Insufficient documentation

## 2021-08-11 DIAGNOSIS — R400 Somnolence: Secondary | ICD-10-CM

## 2021-08-11 DIAGNOSIS — Z131 Encounter for screening for diabetes mellitus: Secondary | ICD-10-CM

## 2021-08-11 DIAGNOSIS — Z13220 Encounter for screening for lipoid disorders: Secondary | ICD-10-CM | POA: Diagnosis not present

## 2021-08-11 DIAGNOSIS — R5383 Other fatigue: Secondary | ICD-10-CM | POA: Diagnosis not present

## 2021-08-11 DIAGNOSIS — R0683 Snoring: Secondary | ICD-10-CM

## 2021-08-11 DIAGNOSIS — Z7185 Encounter for immunization safety counseling: Secondary | ICD-10-CM | POA: Diagnosis not present

## 2021-08-11 DIAGNOSIS — R21 Rash and other nonspecific skin eruption: Secondary | ICD-10-CM | POA: Insufficient documentation

## 2021-08-11 DIAGNOSIS — H5713 Ocular pain, bilateral: Secondary | ICD-10-CM

## 2021-08-11 DIAGNOSIS — N6009 Solitary cyst of unspecified breast: Secondary | ICD-10-CM

## 2021-08-11 MED ORDER — BETAMETHASONE DIPROPIONATE 0.05 % EX CREA: TOPICAL_CREAM | Freq: Two times a day (BID) | CUTANEOUS | 0 refills | Status: DC

## 2021-08-11 NOTE — Progress Notes (Signed)
Subjective:   HPI  Sandra [name] is a 46 y.o. female who presents for Chief Complaint  Patient presents with   fasting cpe    Fasting cpe, sees obgyn     Patient Care Team: Crysten Kaman, Camelia Eng, PA-C as PCP - General (Family Medicine) Dr. Augusto Gamble, eye doctor Dentist Dr. Narda Amber, neurology Dr. Donnie Mesa, general surgery Dr. Aletha Halim, gynecology Dr. Tyson Dense, podiatry Dr. Bo Merino, rheumatology    Concerns: Regularly by her eyes.  Her eyes are hurting and stay irritated often.  She sees eye doctor but wants to get see ophthalmology.  She is worried about this being related to sarcoidosis.  She has history of sarcoidosis.  Has mostly eye pain.  No redness.  No vision change.  She still feels fatigued a lot.  She does have history of snoring and witnessed apnea spells but no prior sleep  She would like to go back to see a specialist about sarcoidosis.  She has not seen rheumatology or pulmonology in the past year.  She still has flareups of rash in her antecubital regions periodically.  The prior prescription for triamcinolone cream helps with the rash and regimen.  She had trichomonas several weeks ago.  She did take treatment.  She would like to be rechecked.  She broke up with her partner.  He was treated as well     Reviewed their medical, surgical, family, social, medication, and allergy history and updated chart as appropriate.  Past Medical History:  Diagnosis Date   Anemia    Arthritis    knees, hips   Complication of anesthesia    nausea and vomiting , dose well with patch   GERD (gastroesophageal reflux disease)    Headache    OAB (overactive bladder)    PONV (postoperative nausea and vomiting)    "patch works well."   Sarcoidosis 2019   Dr. Chesley Mires    Family History  Problem Relation Age of Onset   Asthma Mother    Heart murmur Mother    Diabetes Mother    Sleep apnea Mother    Cancer Maternal Grandmother        stomach    Cancer Maternal Grandfather        prostate   Cancer Paternal Grandmother        breast   Kidney disease Paternal Grandmother        dialysis   Sarcoidosis Neg Hx    Heart disease Neg Hx    Stroke Neg Hx    Breast cancer Neg Hx      Current Outpatient Medications:    acetaminophen (TYLENOL) 500 MG tablet, Take 1 tablet (500 mg total) by mouth every 6 (six) hours as needed., Disp: 30 tablet, Rfl: 0   albuterol (VENTOLIN HFA) 108 (90 Base) MCG/ACT inhaler, Inhale 2 puffs into the lungs every 6 (six) hours as needed for wheezing or shortness of breath., Disp: 8 g, Rfl: 0   betamethasone dipropionate 0.05 % cream, Apply topically 2 (two) times daily., Disp: 45 g, Rfl: 0   levonorgestrel (MIRENA) 20 MCG/24HR IUD, 1 each by Intrauterine route once., Disp: , Rfl:    Loteprednol Etabonate (EYSUVIS) 0.25 % SUSP, Apply to eye., Disp: , Rfl:    Probiotic Product (PROBIOTIC BLEND PO), Take by mouth 3 x daily with food., Disp: , Rfl:    Saline (NASOGEL) GEL, Place 1 spray into both nostrils daily as needed (for moisture)., Disp: , Rfl:    sodium  chloride (OCEAN) 0.65 % SOLN nasal spray, Place 2 sprays into both nostrils as needed for congestion., Disp: , Rfl:   Allergies  Allergen Reactions   Penicillins Rash and Other (See Comments)    PATIENT HAS HAD A PCN REACTION WITH IMMEDIATE RASH, FACIAL/TONGUE/THROAT SWELLING, SOB, OR LIGHTHEADEDNESS WITH HYPOTENSION:  #  #  #  YES  #  #  #   Has patient had a PCN reaction causing severe rash involving mucus membranes or skin necrosis: no Has patient had a PCN reaction that required hospitalization: no Has patient had a PCN reaction occurring within the last 10 years: no    Influenza Vaccines Other (See Comments)    UNSPECIFIED ILLNESS was sick for 1 yr after getting flu shot   Prednisone Other (See Comments)    Causes open sores      Review of Systems Constitutional: -fever, -chills, -sweats, -unexpected weight change, -decreased appetite,  +fatigue Allergy: -sneezing, -itching, -congestion Dermatology: -changing moles, --rash, -lumps ENT: -runny nose, -ear pain, -sore throat, -hoarseness, -sinus pain, -teeth pain, - ringing in ears, -hearing loss, -nosebleeds Cardiology: -chest pain, -palpitations, -swelling, -difficulty breathing when lying flat, -waking up short of breath Respiratory: -cough, -shortness of breath, -difficulty breathing with exercise or exertion, -wheezing, -coughing up blood Gastroenterology: -abdominal pain, -nausea, -vomiting, -diarrhea, -constipation, -blood in stool, -changes in bowel movement, -difficulty swallowing or eating Hematology: -bleeding, -bruising  Musculoskeletal: +joint aches, -muscle aches, -joint swelling, -back pain, -neck pain, -cramping, -changes in gait Ophthalmology: denies vision changes, eye redness, itching, discharge Urology: -burning with urination, -difficulty urinating, -blood in urine, -urinary frequency, -urgency, -incontinence Neurology: -headache, -weakness, -tingling, +numbness, -memory loss, -falls, -dizziness Psychology: -depressed mood, -agitation, -sleep problems Breast/gyn: +breast tendnerss, -discharge, -lumps, -vaginal discharge,- irregular periods, -heavy periods   Depression screen [facility] 2/9 08/11/2021 04/18/2021 01/15/2021 07/31/2019 09/01/2017  Decreased Interest 0 0 - 0 0  Down, Depressed, Hopeless 0 0 0 0 0  PHQ - 2 Score 0 0 0 0 0  Altered sleeping - - 0 - -  Tired, decreased energy - - 2 - -  Change in appetite - - 0 - -  Feeling bad or failure about yourself  - - 0 - -  Trouble concentrating - - 0 - -  Moving slowly or fidgety/restless - - 0 - -  Suicidal thoughts - - 0 - -  PHQ-9 Score - - 2 - -       Objective:  BP 110/74    Pulse 75    Ht 5' 2.5" (1.588 m)    Wt 217 lb 6.4 oz (98.6 kg)    BMI 39.13 kg/m   General appearance: alert, no distress, WD/WN, African American female Skin: bilat antecubital region with patch of rough slightly raised rash,  brown, unremarkable otherwise HEENT: normocephalic, conjunctiva/corneas normal, sclerae anicteric, bilat upper and lower eye lids seems puffy but no redness, no drainage.  PERRLA, EOMi Neck: supple, no lymphadenopathy, no thyromegaly, no masses, normal ROM, no bruits Chest: non tender, normal shape and expansion Heart: RRR, normal S1, S2, no murmurs Lungs: CTA bilaterally, no wheezes, rhonchi, or rales Abdomen: +bs, soft, non tender, non distended, no masses, no hepatomegaly, no splenomegaly, no bruits Back: non tender, normal ROM, no scoliosis Musculoskeletal: upper extremities non tender, no obvious deformity, normal ROM throughout, lower extremities non tender, no obvious deformity, normal ROM throughout Extremities: no edema, no cyanosis, no clubbing Pulses: 2+ symmetric, upper and lower extremities, normal cap refill Neurological: alert, oriented x 3,  CN2-12 intact, strength normal upper extremities and lower extremities, sensation normal throughout, DTRs 2+ throughout, no cerebellar signs, gait normal Psychiatric: normal affect, behavior normal, pleasant  Breast/gyn/rectal - deferred to gynecology     Assessment and Plan :   Encounter Diagnoses  Name Primary?   Encounter for health maintenance examination in adult Yes   Sarcoidosis    Screen for colon cancer    Rash    Gastroesophageal reflux disease without esophagitis    OAB (overactive bladder)    Vaccine counseling    Paresthesia of both feet    Other fatigue    IUD (intrauterine device) in place    Screening for lipid disorders    Screening for diabetes mellitus    History of trichomoniasis    Daytime somnolence    Cyst of breast, unspecified laterality    Witnessed apneic spells    Eye pain, bilateral    Snoring      This visit was a preventative care visit, also known as wellness visit or routine physical.   Topics typically include healthy lifestyle, diet, exercise, preventative care, vaccinations, sick and  well care, proper use of emergency dept and after hours care, as well as other concerns.     Recommendations: Continue to return yearly for your annual wellness and preventative care visits.  This gives [place] a chance to discuss healthy lifestyle, exercise, vaccinations, review your chart record, and perform screenings where appropriate.  I recommend you see your eye doctor yearly for routine vision care.  I recommend you see your dentist yearly for routine dental care including hygiene visits twice yearly.   Vaccination recommendations were reviewed Immunization History  Administered Date(s) Administered   Pneumococcal Polysaccharide-23 07/31/2019   Tdap 07/31/2019     Screening for cancer: Colon cancer screening: We will refer you for screening colonoscopy  Breast cancer screening: You should perform a self breast exam monthly.   We reviewed recommendations for regular mammograms and breast cancer screening.  Cervical cancer screening: We reviewed recommendations for pap smear screening.  Pap reviewed and is up to date   Skin cancer screening: Check your skin regularly for new changes, growing lesions, or other lesions of concern Come in for evaluation if you have skin lesions of concern.  Lung cancer screening: If you have a greater than 20 pack year history of tobacco use, then you may qualify for lung cancer screening with a chest CT scan.   Please call your insurance company to inquire about coverage for this test.  We currently don't have screenings for other cancers besides breast, cervical, colon, and lung cancers.  If you have a strong family history of cancer or have other cancer screening concerns, please let me know.    Bone health: Get at least 150 minutes of aerobic exercise weekly Get weight bearing exercise at least once weekly Bone density test:  A bone density test is an imaging test that uses a type of X-ray to measure the amount of calcium and other  minerals in your bones. The test may be used to diagnose or screen you for a condition that causes weak or thin bones (osteoporosis), predict your risk for a broken bone (fracture), or determine how well your osteoporosis treatment is working. The bone density test is recommended for females 46 and older, or females or males <27 if certain risk factors such as thyroid disease, long term use of steroids such as for asthma or rheumatological issues, vitamin D deficiency, estrogen deficiency,  family history of osteoporosis, self or family history of fragility fracture in first degree relative.    Heart health: Get at least 150 minutes of aerobic exercise weekly Limit alcohol It is important to maintain a healthy blood pressure and healthy cholesterol numbers  Heart disease screening: Screening for heart disease includes screening for blood pressure, fasting lipids, glucose/diabetes screening, BMI height to weight ratio, reviewed of smoking status, physical activity, and diet.    Goals include blood pressure 120/80 or less, maintaining a healthy lipid/cholesterol profile, preventing diabetes or keeping diabetes numbers under good control, not smoking or using tobacco products, exercising most days per week or at least 150 minutes per week of exercise, and eating healthy variety of fruits and vegetables, healthy oils, and avoiding unhealthy food choices like fried food, fast food, high sugar and high cholesterol foods.    Other tests may possibly include EKG test, CT coronary calcium score, echocardiogram, exercise treadmill stress test.    Medical care options: I recommend you continue to seek care here first for routine care.  We try really hard to have available appointments Monday through Friday daytime hours for sick visits, acute visits, and physicals.  Urgent care should be used for after hours and weekends for significant issues that cannot wait till the next day.  The emergency department  should be used for significant potentially life-threatening emergencies.  The emergency department is expensive, can often have long wait times for less significant concerns, so try to utilize primary care, urgent care, or telemedicine when possible to avoid unnecessary trips to the emergency department.  Virtual visits and telemedicine have been introduced since the pandemic started in 2020, and can be convenient ways to receive medical care.  We offer virtual appointments as well to assist you in a variety of options to seek medical care.    Separate significant issues discussed: Eye pain, history of sarcoidosis-referral to ophthalmology  History of sarcoidosis-advised updated recheck with rheumatology.  She will call to schedule.  Update labs as below.  I reviewed the last pulmonology notes in the past 2 years.  She is due back with pulmonology as well although she has no current lung or breathing symptoms  Recent trichomonas-recheck labs since she and partner got treated.  She has since broken up with a partner.  Advise condom use.  Overactive bladder-no recent concern  GERD-no recent concern  Rash-cream prescribed below to use for flareups.  Stop triamcinolone as it is not working as good.  Discussed risk and benefits of medication and not to overuse medication.  Continue daily moisturizing lotion.  Paresthesias of both feet-recent eval with neurology including nerve conduction study.  Fatigue-possibly related to sarcoid or other such as sleep apnea.  Advise sleep evaluation.  Follow-up with rheumatology.  Labs as below  Witnessed apnea spells, fatigue, snoring-referral for sleep evaluation   Sandra [name] was seen today for fasting cpe.  Diagnoses and all orders for this visit:  Encounter for health maintenance examination in adult -     Sedimentation rate -     High sensitivity CRP -     Lipid panel -     Hemoglobin A1c -     CBC with Differential/Platelet -     Comprehensive  metabolic panel -     Chlamydia/Gonococcus/Trichomonas, NAA  Sarcoidosis -     Ambulatory referral to Ophthalmology -     Sedimentation rate -     High sensitivity CRP  Screen for colon cancer -  Ambulatory referral to Gastroenterology  Rash  Gastroesophageal reflux disease without esophagitis  OAB (overactive bladder)  Vaccine counseling  Paresthesia of both feet  Other fatigue -     Ambulatory referral to Neurology  IUD (intrauterine device) in place  Screening for lipid disorders -     Lipid panel  Screening for diabetes mellitus -     Hemoglobin A1c  History of trichomoniasis -     Chlamydia/Gonococcus/Trichomonas, NAA  Daytime somnolence  Cyst of breast, unspecified laterality  Witnessed apneic spells -     Ambulatory referral to Neurology  Eye pain, bilateral  Snoring -     Ambulatory referral to Neurology  Other orders -     betamethasone dipropionate 0.05 % cream; Apply topically 2 (two) times daily.    Follow-up pending labs, yearly for physical

## 2021-08-12 ENCOUNTER — Encounter: Payer: Self-pay | Admitting: Gastroenterology

## 2021-08-12 LAB — HEMOGLOBIN A1C
Est. average glucose Bld gHb Est-mCnc: 114 mg/dL
Hgb A1c MFr Bld: 5.6 % (ref 4.8–5.6)

## 2021-08-12 LAB — CBC WITH DIFFERENTIAL/PLATELET
Basophils Absolute: 0.1 10*3/uL (ref 0.0–0.2)
Basos: 1 %
EOS (ABSOLUTE): 0.1 10*3/uL (ref 0.0–0.4)
Eos: 3 %
Hematocrit: 36.8 % (ref 34.0–46.6)
Hemoglobin: 12.5 g/dL (ref 11.1–15.9)
Immature Grans (Abs): 0 10*3/uL (ref 0.0–0.1)
Immature Granulocytes: 0 %
Lymphocytes Absolute: 1.8 10*3/uL (ref 0.7–3.1)
Lymphs: 40 %
MCH: 29.8 pg (ref 26.6–33.0)
MCHC: 34 g/dL (ref 31.5–35.7)
MCV: 88 fL (ref 79–97)
Monocytes Absolute: 0.5 10*3/uL (ref 0.1–0.9)
Monocytes: 12 %
Neutrophils Absolute: 2.1 10*3/uL (ref 1.4–7.0)
Neutrophils: 44 %
Platelets: 325 10*3/uL (ref 150–450)
RBC: 4.2 x10E6/uL (ref 3.77–5.28)
RDW: 12.7 % (ref 11.7–15.4)
WBC: 4.6 10*3/uL (ref 3.4–10.8)

## 2021-08-12 LAB — COMPREHENSIVE METABOLIC PANEL
ALT: 10 IU/L (ref 0–32)
AST: 17 IU/L (ref 0–40)
Albumin/Globulin Ratio: 1.9 (ref 1.2–2.2)
Albumin: 4.2 g/dL (ref 3.8–4.8)
Alkaline Phosphatase: 54 IU/L (ref 44–121)
BUN/Creatinine Ratio: 13 (ref 9–23)
BUN: 10 mg/dL (ref 6–24)
Bilirubin Total: 0.3 mg/dL (ref 0.0–1.2)
CO2: 23 mmol/L (ref 20–29)
Calcium: 9.3 mg/dL (ref 8.7–10.2)
Chloride: 105 mmol/L (ref 96–106)
Creatinine, Ser: 0.77 mg/dL (ref 0.57–1.00)
Globulin, Total: 2.2 g/dL (ref 1.5–4.5)
Glucose: 100 mg/dL — ABNORMAL HIGH (ref 70–99)
Potassium: 4.6 mmol/L (ref 3.5–5.2)
Sodium: 139 mmol/L (ref 134–144)
Total Protein: 6.4 g/dL (ref 6.0–8.5)
eGFR: 97 mL/min/{1.73_m2} (ref >59)

## 2021-08-12 LAB — LIPID PANEL
Chol/HDL Ratio: 3.2 ratio (ref 0.0–4.4)
Cholesterol, Total: 168 mg/dL (ref 100–199)
HDL: 52 mg/dL (ref >39)
LDL Chol Calc (NIH): 108 mg/dL — ABNORMAL HIGH (ref 0–99)
Triglycerides: 34 mg/dL (ref 0–149)
VLDL Cholesterol Cal: 8 mg/dL (ref 5–40)

## 2021-08-12 LAB — CHLAMYDIA/GONOCOCCUS/TRICHOMONAS, NAA
Chlamydia by NAA: NEGATIVE
Gonococcus by NAA: NEGATIVE
Trich vag by NAA: NEGATIVE

## 2021-08-12 LAB — HIGH SENSITIVITY CRP: CRP, High Sensitivity: 0.4 mg/L (ref 0.00–3.00)

## 2021-08-12 LAB — SEDIMENTATION RATE: Sed Rate: 10 mm/hr (ref 0–32)

## 2021-08-21 ENCOUNTER — Other Ambulatory Visit: Payer: Self-pay

## 2021-08-21 ENCOUNTER — Ambulatory Visit (AMBULATORY_SURGERY_CENTER): Payer: 59 | Admitting: *Deleted

## 2021-08-21 VITALS — Ht 62.0 in | Wt 213.0 lb

## 2021-08-21 DIAGNOSIS — Z1211 Encounter for screening for malignant neoplasm of colon: Secondary | ICD-10-CM

## 2021-08-21 MED ORDER — NA SULFATE-K SULFATE-MG SULF 17.5-3.13-1.6 GM/177ML PO SOLN: 1.0000 | Freq: Once | ORAL | 0 refills | Status: AC

## 2021-08-21 NOTE — Progress Notes (Signed)
No egg or soy allergy known to patient  issues known to pt with past sedation with any surgeries or procedures PONV  Patient denies ever being told they had issues or difficulty with intubation  No FH of Malignant Hyperthermia Pt is not on diet pills Pt is not on  home 02  Pt is not on blood thinners  Pt states  issues with constipation - has a BM every 2-3  days - varies hard and soft- if eats fruits and veggies soft bm's- uses a smooth move tea OTC and it helps  No A fib or A flutter  Pt is fully vaccinated  for Covid    NO PA's for preps discussed with pt In PV today  Discussed with pt there will be an out-of-pocket cost for prep and that varies from $0 to 70 +  dollars - pt verbalized understanding   Due to the COVID-19 pandemic we are asking patients to follow certain guidelines in PV and the [place]   Pt aware of COVID protocols and [facility] guidelines   PV completed over the phone. Pt verified name, DOB, address and insurance during PV today.   Pt encouraged to call with questions or issues.  If pt has My chart, procedure instructions sent via My Chart

## 2021-08-25 ENCOUNTER — Telehealth: Payer: Self-pay | Admitting: Gastroenterology

## 2021-08-25 DIAGNOSIS — Z1211 Encounter for screening for malignant neoplasm of colon: Secondary | ICD-10-CM

## 2021-08-25 MED ORDER — NA SULFATE-K SULFATE-MG SULF 17.5-3.13-1.6 GM/177ML PO SOLN: 1.0000 | ORAL | 0 refills | Status: DC

## 2021-08-25 NOTE — Telephone Encounter (Signed)
Inbound call from patient states surprep was not sent to her pharmacy

## 2021-08-25 NOTE — Telephone Encounter (Signed)
Resent Suprep to pt's [facility] per request.

## 2021-08-28 ENCOUNTER — Telehealth: Payer: Self-pay | Admitting: Medical

## 2021-08-28 NOTE — Telephone Encounter (Signed)
Received a call from Dr. Estanislado Pandy office is response to a medical records request sent\. They advise all records are in [place].

## 2021-09-01 ENCOUNTER — Telehealth: Payer: Self-pay | Admitting: Medical

## 2021-09-01 NOTE — Telephone Encounter (Signed)
Referral Followup °

## 2021-09-08 DIAGNOSIS — D869 Sarcoidosis, unspecified: Secondary | ICD-10-CM | POA: Diagnosis not present

## 2021-09-08 DIAGNOSIS — H524 Presbyopia: Secondary | ICD-10-CM | POA: Diagnosis not present

## 2021-09-09 ENCOUNTER — Encounter: Payer: Self-pay | Admitting: Gastroenterology

## 2021-09-11 ENCOUNTER — Ambulatory Visit (AMBULATORY_SURGERY_CENTER): Payer: 59 | Admitting: Gastroenterology

## 2021-09-11 ENCOUNTER — Encounter: Payer: Self-pay | Admitting: Gastroenterology

## 2021-09-11 ENCOUNTER — Other Ambulatory Visit: Payer: Self-pay

## 2021-09-11 VITALS — BP 100/74 | HR 65 | Temp 97.3°F | Resp 13 | Ht 62.5 in | Wt 213.0 lb

## 2021-09-11 DIAGNOSIS — Z1211 Encounter for screening for malignant neoplasm of colon: Secondary | ICD-10-CM

## 2021-09-11 MED ORDER — SODIUM CHLORIDE 0.9 % IV SOLN: 500.0000 mL | Freq: Once | INTRAVENOUS | Status: DC

## 2021-09-11 NOTE — Op Note (Signed)
[facility] Patient Name: Sandra [name] Procedure Date: 09/11/2021 1:54 PM MRN: [id number] Endoscopist: Sherilyn Cooter L. Myrtie Neither , MD Age: 46 Referring MD:  Date of Birth: [birth year] Gender: Female Account #: [id number] Procedure:                Colonoscopy Indications:              Screening for colorectal malignant neoplasm, This                            is the patient's first colonoscopy Medicines:                Monitored Anesthesia Care Procedure:                Pre-Anesthesia Assessment:                           - Prior to the procedure, a History and Physical                            was performed, and patient medications and                            allergies were reviewed. The patient's tolerance of                            previous anesthesia was also reviewed. The risks                            and benefits of the procedure and the sedation                            options and risks were discussed with the patient.                            All questions were answered, and informed consent                            was obtained. Prior Anticoagulants: The patient has                            taken no previous anticoagulant or antiplatelet                            agents. ASA Grade Assessment: II - A patient with                            mild systemic disease. After reviewing the risks                            and benefits, the patient was deemed in                            satisfactory condition to undergo the procedure.  After obtaining informed consent, the colonoscope                            was passed under direct vision. Throughout the                            procedure, the patient's blood pressure, pulse, and                            oxygen saturations were monitored continuously. The                            Olympus CF-HQ190L [id number]) Colonoscope was                            introduced through the anus  and advanced to the the                            cecum, identified by appendiceal orifice and                            ileocecal valve. The colonoscopy was somewhat                            difficult due to a redundant colon. Successful                            completion of the procedure was aided by                            straightening and shortening the scope to obtain                            bowel loop reduction. The patient tolerated the                            procedure well. The quality of the bowel                            preparation was excellent. The ileocecal valve,                            appendiceal orifice, and rectum were photographed.                            The bowel preparation used was SUPREP. Scope In: 2:00:13 PM Scope Out: 2:13:15 PM Scope Withdrawal Time: 0 hours 7 minutes 47 seconds  Total Procedure Duration: 0 hours 13 minutes 2 seconds  Findings:                 The perianal and digital rectal examinations were                            normal.  The entire examined colon appeared normal on direct                            and retroflexion views. Complications:            No immediate complications. Estimated Blood Loss:     Estimated blood loss: none. Impression:               - The entire examined colon is normal on direct and                            retroflexion views.                           - No specimens collected. Recommendation:           - Patient has a contact number available for                            emergencies. The signs and symptoms of potential                            delayed complications were discussed with the                            patient. Return to normal activities tomorrow.                            Written discharge instructions were provided to the                            patient.                           - Resume previous diet.                           -  Continue present medications.                           - Repeat colonoscopy in 10 years for screening                            purposes. Ison Wichmann L. Myrtie Neither, MD 09/11/2021 2:17:05 PM This report has been signed electronically.

## 2021-09-11 NOTE — Patient Instructions (Signed)
Thank you for letting us take care of your healthcare needs today.     YOU HAD AN ENDOSCOPIC PROCEDURE TODAY AT [facility]:   Refer to the procedure report that was given to you for any specific questions about what was found during the examination.  If the procedure report does not answer your questions, please call your gastroenterologist to clarify.  If you requested that your care partner not be given the details of your procedure findings, then the procedure report has been included in a sealed envelope for you to review at your convenience later.  YOU SHOULD EXPECT: Some feelings of bloating in the abdomen. Passage of more gas than usual.  Walking can help get rid of the air that was put into your GI tract during the procedure and reduce the bloating. If you had a lower endoscopy (such as a colonoscopy or flexible sigmoidoscopy) you may notice spotting of blood in your stool or on the toilet paper. If you underwent a bowel prep for your procedure, you may not have a normal bowel movement for a few days.  Please Note:  You might notice some irritation and congestion in your nose or some drainage.  This is from the oxygen used during your procedure.  There is no need for concern and it should clear up in a day or so.  SYMPTOMS TO REPORT IMMEDIATELY:  Following lower endoscopy (colonoscopy or flexible sigmoidoscopy):  Excessive amounts of blood in the stool  Significant tenderness or worsening of abdominal pains  Swelling of the abdomen that is new, acute  Fever of 100F or higher   For urgent or emergent issues, a gastroenterologist can be reached at any hour by calling [phone]. Do not use MyChart messaging for urgent concerns.    DIET:  We do recommend a small meal at first, but then you may proceed to your regular diet.  Drink plenty of fluids but you should avoid alcoholic beverages for 24 hours.  ACTIVITY:  You should plan to take it easy for the rest of  today and you should NOT DRIVE or use heavy machinery until tomorrow (because of the sedation medicines used during the test).    FOLLOW UP: Our staff will call the number listed on your records 48-72 hours following your procedure to check on you and address any questions or concerns that you may have regarding the information given to you following your procedure. If we do not reach you, we will leave a message.  We will attempt to reach you two times.  During this call, we will ask if you have developed any symptoms of COVID 19. If you develop any symptoms (ie: fever, flu-like symptoms, shortness of breath, cough etc.) before then, please call [phone].  If you test positive for Covid 19 in the 2 weeks post procedure, please call and report this information to [place].    If any biopsies were taken you will be contacted by phone or by letter within the next 1-3 weeks.  Please call us at [phone] if you have not heard about the biopsies in 3 weeks.    SIGNATURES/CONFIDENTIALITY: You and/or your care partner have signed paperwork which will be entered into your electronic medical record.  These signatures attest to the fact that that the information above on your After Visit Summary has been reviewed and is understood.  Full responsibility of the confidentiality of this discharge information lies with you and/or your care-partner.

## 2021-09-11 NOTE — Progress Notes (Signed)
Pt's states no medical or surgical changes since previsit or office visit.  ° °VS DT °

## 2021-09-11 NOTE — Progress Notes (Signed)
To Pacu, VSS. Report to [email or web address] ?

## 2021-09-11 NOTE — Progress Notes (Signed)
History and Physical:  This patient presents for endoscopic testing for: Encounter Diagnosis  Name Primary?   Special screening for malignant neoplasms, colon Yes    Patient denies chronic abdominal pain, rectal bleeding, constipation or diarrhea. First screening exam  ROS: Patient denies chest pain or shortness of breath   Past Medical History: Past Medical History:  Diagnosis Date   Anemia    Arthritis    knees, hips   Complication of anesthesia    nausea and vomiting , dose well with patch   COVID-19 virus infection 08/07/2021   GERD (gastroesophageal reflux disease)    Headache    OAB (overactive bladder)    PONV (postoperative nausea and vomiting)    "patch works well."   Sarcoidosis 2019   Dr. Chesley Mires     Past Surgical History: Past Surgical History:  Procedure Laterality Date   BREAST EXCISIONAL BIOPSY Right    2015   BREAST EXCISIONAL BIOPSY Left    2007 Papilloma removed   BREAST LUMPECTOMY WITH RADIOACTIVE SEED LOCALIZATION Left 04/08/2021   Procedure: LEFT BREAST LUMPECTOMY WITH RADIOACTIVE SEED LOCALIZATION;  Surgeon: Donnie Mesa, MD;  Location: [place];  Service: General;  Laterality: Left;   BREAST SURGERY     lt breast hematoma   CESAREAN SECTION     x 2   CHOLECYSTECTOMY N/A 06/28/2017   Procedure: LAPAROSCOPIC CHOLECYSTECTOMY;  Surgeon: Coralie Keens, MD;  Location: [place];  Service: General;  Laterality: N/A;   DILATATION & CURETTAGE/HYSTEROSCOPY WITH MYOSURE N/A 03/12/2021   Procedure: DILATATION & CURETTAGE/HYSTEROSCOPY;  Surgeon: Aletha Halim, MD;  Location: [place];  Service: Gynecology;  Laterality: N/A;   ESOPHAGOGASTRODUODENOSCOPY  05/2017   Dr. Collene Mares   HERNIA REPAIR     Umbilical Hernia   INTRAUTERINE DEVICE (IUD) INSERTION N/A 03/12/2021   Procedure: INTRAUTERINE DEVICE (IUD) INSERTION Mirena;  Surgeon: Aletha Halim, MD;  Location: [facility];  Service: Gynecology;   Laterality: N/A;   IUD REMOVAL N/A 03/12/2021   Procedure: INTRAUTERINE DEVICE (IUD) REMOVAL;  Surgeon: Aletha Halim, MD;  Location: [place];  Service: Gynecology;  Laterality: N/A;   VIDEO BRONCHOSCOPY WITH ENDOBRONCHIAL ULTRASOUND N/A 09/28/2017   Procedure: VIDEO BRONCHOSCOPY WITH ENDOBRONCHIAL ULTRASOUND;  Surgeon: Collene Gobble, MD;  Location[facility];  Service: Thoracic;  Laterality: N/A;   WISDOM TOOTH EXTRACTION     2    Allergies: Allergies  Allergen Reactions   Penicillins Rash and Other (See Comments)    PATIENT HAS HAD A PCN REACTION WITH IMMEDIATE RASH, FACIAL/TONGUE/THROAT SWELLING, SOB, OR LIGHTHEADEDNESS WITH HYPOTENSION:  #  #  #  YES  #  #  #   Has patient had a PCN reaction causing severe rash involving mucus membranes or skin necrosis: no Has patient had a PCN reaction that required hospitalization: no Has patient had a PCN reaction occurring within the last 10 years: no    Influenza Vaccines Other (See Comments)    UNSPECIFIED ILLNESS was sick for 1 yr after getting flu shot   Prednisone Other (See Comments)    Causes open sores     Outpatient Meds: Current Outpatient Medications  Medication Sig Dispense Refill   acetaminophen (TYLENOL) 500 MG tablet Take 1 tablet (500 mg total) by mouth every 6 (six) hours as needed. 30 tablet 0   Loteprednol Etabonate (EYSUVIS) 0.25 % SUSP Apply to eye.     Probiotic Product (PROBIOTIC BLEND PO) Take by mouth 3 x daily  with food.     Saline (NASOGEL) GEL Place 1 spray into both nostrils daily as needed (for moisture).     sodium chloride (OCEAN) 0.65 % SOLN nasal spray Place 2 sprays into both nostrils as needed for congestion.     albuterol (VENTOLIN HFA) 108 (90 Base) MCG/ACT inhaler Inhale 2 puffs into the lungs every 6 (six) hours as needed for wheezing or shortness of breath. 8 g 0   betamethasone dipropionate 0.05 % cream Apply topically 2 (two) times daily. 45 g 0   levonorgestrel (MIRENA) 20  MCG/24HR IUD 1 each by Intrauterine route once.     Current Facility-Administered Medications  Medication Dose Route Frequency Provider Last Rate Last Admin   0.9 %  sodium chloride infusion  500 mL Intravenous Once Danis, Estill Cotta III, MD          ___________________________________________________________________ Objective   Exam:  BP 109/80    Pulse 74    Temp (!) 97.3 F (36.3 C) (Temporal)    Resp 15    Ht 5' 2.5" (1.588 m)    Wt 213 lb (96.6 kg)    LMP 08/15/2021    SpO2 100%    BMI 38.34 kg/m   CV: RRR without murmur, S1/S2 Resp: clear to auscultation bilaterally, normal RR and effort noted GI: soft, no tenderness, with active bowel sounds.   Assessment: Encounter Diagnosis  Name Primary?   Special screening for malignant neoplasms, colon Yes     Plan: Colonoscopy  The benefits and risks of the planned procedure were described in detail with the patient or (when appropriate) their health care proxy.  Risks were outlined as including, but not limited to, bleeding, infection, perforation, adverse medication reaction leading to cardiac or pulmonary decompensation, pancreatitis (if ERCP).  The limitation of incomplete mucosal visualization was also discussed.  No guarantees or warranties were given.    The patient is appropriate for an endoscopic procedure in the ambulatory setting.   - Wilfrid Lund, MD

## 2021-09-16 ENCOUNTER — Telehealth: Payer: Self-pay

## 2021-09-16 NOTE — Telephone Encounter (Signed)
°  Follow up Call-  Call back number 09/11/2021  Post procedure Call Back phone  # [phone]  Permission to leave phone message Yes  Some recent data might be hidden     Patient questions:  Do you have a fever, pain , or abdominal swelling? No. Pain Score  0 *  Have you tolerated food without any problems? Yes.    Have you been able to return to your normal activities? Yes.    Do you have any questions about your discharge instructions: Diet   No. Medications  No. Follow up visit  No.  Do you have questions or concerns about your Care? No.  Actions: * If pain score is 4 or above: No action needed, pain <4.

## 2021-10-09 DIAGNOSIS — M2550 Pain in unspecified joint: Secondary | ICD-10-CM | POA: Diagnosis not present

## 2021-10-09 DIAGNOSIS — M064 Inflammatory polyarthropathy: Secondary | ICD-10-CM | POA: Diagnosis not present

## 2021-10-09 DIAGNOSIS — M47819 Spondylosis without myelopathy or radiculopathy, site unspecified: Secondary | ICD-10-CM | POA: Diagnosis not present

## 2021-10-09 DIAGNOSIS — M25561 Pain in right knee: Secondary | ICD-10-CM | POA: Diagnosis not present

## 2021-10-09 DIAGNOSIS — M25562 Pain in left knee: Secondary | ICD-10-CM | POA: Diagnosis not present

## 2021-10-09 DIAGNOSIS — D869 Sarcoidosis, unspecified: Secondary | ICD-10-CM | POA: Diagnosis not present

## 2021-10-09 DIAGNOSIS — M797 Fibromyalgia: Secondary | ICD-10-CM | POA: Diagnosis not present

## 2021-10-09 DIAGNOSIS — M549 Dorsalgia, unspecified: Secondary | ICD-10-CM | POA: Diagnosis not present

## 2021-10-10 ENCOUNTER — Other Ambulatory Visit: Payer: Self-pay

## 2021-10-10 ENCOUNTER — Ambulatory Visit: Payer: 59 | Admitting: Obstetrics & Gynecology

## 2021-10-10 ENCOUNTER — Telehealth: Payer: Self-pay | Admitting: Family Medicine

## 2021-10-10 ENCOUNTER — Encounter: Payer: Self-pay | Admitting: Obstetrics & Gynecology

## 2021-10-10 VITALS — BP 115/68 | HR 75 | Wt 219.0 lb

## 2021-10-10 DIAGNOSIS — Z975 Presence of (intrauterine) contraceptive device: Secondary | ICD-10-CM

## 2021-10-10 DIAGNOSIS — R102 Pelvic and perineal pain: Secondary | ICD-10-CM

## 2021-10-10 DIAGNOSIS — N939 Abnormal uterine and vaginal bleeding, unspecified: Secondary | ICD-10-CM | POA: Diagnosis not present

## 2021-10-10 DIAGNOSIS — G8929 Other chronic pain: Secondary | ICD-10-CM

## 2021-10-10 DIAGNOSIS — D219 Benign neoplasm of connective and other soft tissue, unspecified: Secondary | ICD-10-CM | POA: Diagnosis not present

## 2021-10-10 MED ORDER — MYFEMBREE 40-1-0.5 MG PO TABS: 1.0000 | ORAL_TABLET | Freq: Every day | ORAL | 2 refills | Status: DC

## 2021-10-10 NOTE — Progress Notes (Signed)
? ?GYNECOLOGY OFFICE VISIT NOTE ? ?History:  ? Sandra [name] is a 46 y.o. G2P2000 here today for evaluation of persistent pelvic pain.  Reports right sided pelvic pain for several months, ultrasound in 01/2021 showed 3 cm intramural fibroids.  Given her pain, especially worsened during intercourse, she had placement of Mirena in 02/2021. Pain still persisted but is now accompanied by heavy irregular bleeding that has occurred since Mirena placement.  She bleeds almost every other day, ranging from spotting to heavy bleeding.   Last episode was yesterday. Takes Ibuprofen, Aleve and Tylenol as needed.   No dizziness, lightheadedness or other concerns.  ?  ?Past Medical History:  ?Diagnosis Date  ? Anemia   ? Arthritis   ? knees, hips  ? Complication of anesthesia   ? nausea and vomiting , dose well with patch  ? COVID-19 virus infection 08/07/2021  ? GERD (gastroesophageal reflux disease)   ? Headache   ? OAB (overactive bladder)   ? PONV (postoperative nausea and vomiting)   ? "patch works well."  ? Sarcoidosis 2019  ? Dr. Chesley Mires  ? ? ?Past Surgical History:  ?Procedure Laterality Date  ? BREAST EXCISIONAL BIOPSY Right   ? 2015  ? BREAST EXCISIONAL BIOPSY Left   ? 2007 Papilloma removed  ? BREAST LUMPECTOMY WITH RADIOACTIVE SEED LOCALIZATION Left 04/08/2021  ? Procedure: LEFT BREAST LUMPECTOMY WITH RADIOACTIVE SEED LOCALIZATION;  Surgeon: Donnie Mesa, MD;  Location: [place];  Service: General;  Laterality: Left;  ? BREAST SURGERY    ? lt breast hematoma  ? CESAREAN SECTION    ? x 2  ? CHOLECYSTECTOMY N/A 06/28/2017  ? Procedure: LAPAROSCOPIC CHOLECYSTECTOMY;  Surgeon: Coralie Keens, MD;  Location: [place];  Service: General;  Laterality: N/A;  ? DILATATION & CURETTAGE/HYSTEROSCOPY WITH MYOSURE N/A 03/12/2021  ? Procedure: DILATATION & CURETTAGE/HYSTEROSCOPY;  Surgeon: Aletha Halim, MD;  Location: [facility];  Service: Gynecology;  Laterality: N/A;  ?  ESOPHAGOGASTRODUODENOSCOPY  05/2017  ? Dr. Collene Mares  ? HERNIA REPAIR    ? Umbilical Hernia  ? INTRAUTERINE DEVICE (IUD) INSERTION N/A 03/12/2021  ? Procedure: INTRAUTERINE DEVICE (IUD) INSERTION Mirena;  Surgeon: Aletha Halim, MD;  Location: [facility];  Service: Gynecology;  Laterality: N/A;  ? IUD REMOVAL N/A 03/12/2021  ? Procedure: INTRAUTERINE DEVICE (IUD) REMOVAL;  Surgeon: Aletha Halim, MD;  Location: [place];  Service: Gynecology;  Laterality: N/A;  ? VIDEO BRONCHOSCOPY WITH ENDOBRONCHIAL ULTRASOUND N/A 09/28/2017  ? Procedure: VIDEO BRONCHOSCOPY WITH ENDOBRONCHIAL ULTRASOUND;  Surgeon: Collene Gobble, MD;  [facility];  Service: Thoracic;  Laterality: N/A;  ? WISDOM TOOTH EXTRACTION    ? 2  ? ? ?The following portions of the patient's history were reviewed and updated as appropriate: allergies, current medications, past family history, past medical history, past social history, past surgical history and problem list.  ? ?Health Maintenance:  Normal pap and negative HRHPV on 01/15/2021.  Normal mammogram on 04/08/2021.  ? ?Review of Systems:  ?Pertinent items noted in HPI and remainder of comprehensive ROS otherwise negative. ? ?Physical Exam:  ?BP 115/68   Pulse 75   Wt 219 lb (99.3 kg)   BMI 39.42 kg/m?  ?CONSTITUTIONAL: Well-developed, well-nourished female in no acute distress.  ?HEENT:  Normocephalic, atraumatic. External right and left ear normal. No scleral icterus.  ?NECK: Normal range of motion, supple, no masses noted on observation ?SKIN: No rash noted. Not diaphoretic. No erythema. No pallor. ?MUSCULOSKELETAL: Normal  range of motion. No edema noted. ?NEUROLOGIC: Alert and oriented to person, place, and time. Normal muscle tone coordination. No cranial nerve deficit noted. ?PSYCHIATRIC: Normal mood and affect. Normal behavior. Normal judgment and thought content. ?CARDIOVASCULAR: Normal heart rate noted ?RESPIRATORY: Effort and breath sounds normal, no  problems with respiration noted ?ABDOMEN: No masses noted. No other overt distention noted.   ?PELVIC: Normal appearing external genitalia; normal urethral meatus; normal appearing vaginal mucosa and cervix. Mirena strings seen about 2 cm in length.  No abnormal discharge noted, no blood residue noted.  Normal uterine size, no other palpable masses, no uterine tenderness but had right adnexal tenderness. Performed in the presence of a chaperone ?    ?Assessment and Plan:  ?   ?1. Chronic female pelvic pain ?2. Fibroids ?3. IUD (intrauterine device) in place since 02/2021 ?4. Abnormal uterine bleeding (AUB) ?Recommended continuing NSAIDs (Ibuprofen or Aleve) and Tylenol as needed for pain, but discussed novel therapies for bleeding, pain in the setting of fibroids such as MyFembree.  She wants to try this, this was ordered for her. Will also get an ultrasound to evaluate IUD position and follow up fibroids, will follow up results and manage accordingly. Of note, patient was offered IUD removal, she declined at this point.  Patient will follow up in one month ?- Relugolix-Estradiol-Norethind (MYFEMBREE) 40-1-0.5 MG TABS; Take 1 tablet by mouth daily at 12 noon.  Dispense: 30 tablet; Refill: 2 ?- US PELVIC COMPLETE WITH TRANSVAGINAL; Future ? ?Routine preventative health maintenance measures emphasized. ?Please refer to After Visit Summary for other counseling recommendations.  ? ?Return in about 1 month (around 11/10/2021) for Followup.   ? ?I spent 20 minutes dedicated to the care of this patient including pre-visit review of records, face to face time with the patient discussing her conditions and treatments and post visit orders. ? ? ? ?Verita Schneiders, MD, FACOG ?Obstetrician Social research officer, government, Faculty Practice ?[facility] ?

## 2021-10-10 NOTE — Patient Instructions (Signed)
MyFembree once a day ? ?Barbette Merino  twice a day ? ?With fibroid associated pain and bleeding ?

## 2021-10-10 NOTE — Telephone Encounter (Signed)
Called patient to schedule follow up visit, there was no answer to the phone call so a voicemail was left with the call back number for the office and a mychart message was sent.  ?

## 2021-10-16 ENCOUNTER — Other Ambulatory Visit: Payer: Self-pay

## 2021-10-16 ENCOUNTER — Ambulatory Visit
Admission: RE | Admit: 2021-10-16 | Discharge: 2021-10-16 | Disposition: A | Discharge status: Home / Self Care | Payer: 59 | Source: Ambulatory Visit | Attending: Obstetrics & Gynecology | Admitting: Obstetrics & Gynecology

## 2021-10-16 DIAGNOSIS — N939 Abnormal uterine and vaginal bleeding, unspecified: Secondary | ICD-10-CM | POA: Diagnosis not present

## 2021-10-16 DIAGNOSIS — D219 Benign neoplasm of connective and other soft tissue, unspecified: Secondary | ICD-10-CM | POA: Insufficient documentation

## 2021-10-16 DIAGNOSIS — D250 Submucous leiomyoma of uterus: Secondary | ICD-10-CM | POA: Diagnosis not present

## 2021-10-16 DIAGNOSIS — R102 Pelvic and perineal pain: Secondary | ICD-10-CM | POA: Insufficient documentation

## 2021-10-16 DIAGNOSIS — Z975 Presence of (intrauterine) contraceptive device: Secondary | ICD-10-CM | POA: Insufficient documentation

## 2021-10-16 DIAGNOSIS — G8929 Other chronic pain: Secondary | ICD-10-CM | POA: Diagnosis not present

## 2021-10-16 DIAGNOSIS — N854 Malposition of uterus: Secondary | ICD-10-CM | POA: Diagnosis not present

## 2021-10-22 ENCOUNTER — Institutional Professional Consult (permissible substitution): Payer: 59 | Admitting: Neurology

## 2021-10-23 DIAGNOSIS — M797 Fibromyalgia: Secondary | ICD-10-CM | POA: Diagnosis not present

## 2021-10-23 DIAGNOSIS — M47819 Spondylosis without myelopathy or radiculopathy, site unspecified: Secondary | ICD-10-CM | POA: Diagnosis not present

## 2021-10-23 DIAGNOSIS — D869 Sarcoidosis, unspecified: Secondary | ICD-10-CM | POA: Diagnosis not present

## 2021-10-23 DIAGNOSIS — M064 Inflammatory polyarthropathy: Secondary | ICD-10-CM | POA: Diagnosis not present

## 2021-10-23 DIAGNOSIS — M549 Dorsalgia, unspecified: Secondary | ICD-10-CM | POA: Diagnosis not present

## 2021-10-23 DIAGNOSIS — M2550 Pain in unspecified joint: Secondary | ICD-10-CM | POA: Diagnosis not present

## 2021-11-10 ENCOUNTER — Ambulatory Visit: Payer: 59 | Admitting: Obstetrics & Gynecology

## 2021-11-12 ENCOUNTER — Encounter (HOSPITAL_COMMUNITY): Payer: Self-pay

## 2021-11-14 ENCOUNTER — Encounter: Payer: Self-pay | Admitting: Medical

## 2021-11-14 ENCOUNTER — Ambulatory Visit: Payer: 59 | Admitting: Medical

## 2021-11-14 VITALS — BP 120/70 | HR 87 | Wt 217.0 lb

## 2021-11-14 DIAGNOSIS — M2550 Pain in unspecified joint: Secondary | ICD-10-CM

## 2021-11-14 DIAGNOSIS — H5789 Other specified disorders of eye and adnexa: Secondary | ICD-10-CM

## 2021-11-14 DIAGNOSIS — R5383 Other fatigue: Secondary | ICD-10-CM

## 2021-11-14 DIAGNOSIS — Z862 Personal history of diseases of the blood and blood-forming organs and certain disorders involving the immune mechanism: Secondary | ICD-10-CM

## 2021-11-14 DIAGNOSIS — M797 Fibromyalgia: Secondary | ICD-10-CM

## 2021-11-14 MED ORDER — METHOCARBAMOL 500 MG PO TABS: 500.0000 mg | ORAL_TABLET | Freq: Three times a day (TID) | ORAL | 0 refills | Status: DC | PRN

## 2021-11-14 MED ORDER — GABAPENTIN 100 MG PO CAPS: 100.0000 mg | ORAL_CAPSULE | Freq: Every day | ORAL | 2 refills | Status: DC

## 2021-11-14 NOTE — Addendum Note (Signed)
Addended by: Carlena Hurl on: 11/14/2021 01:23 PM ? ? Modules accepted: Level of Service ? ?

## 2021-11-14 NOTE — Progress Notes (Signed)
Subjective: ? Sandra [name] is a 46 y.o. female who presents for ?Chief Complaint  ?Patient presents with  ? discuss treatment options  ?  Saw rheumatology and was dx with fibromyalgia and osteoporosis for treatment options. As rheumatology only rx naproxen  ? ? Patient Care Team: ?Binyamin Nelis, Leward Quan as PCP - General (Family Medicine) ?Dr. Augusto Gamble, Dr. Rutherford Guys, eye doctor ?Dentist ?Dr. Narda Amber, neurology ?Dr. Donnie Mesa, general surgery ?Dr. Aletha Halim, gynecology ?Dr. Tyson Dense, podiatry ?Dr. Bo Merino and Dr. Tillie Rung, rheumatology ? ?Here for follow-up.  I saw her a few months ago.  Since then she had colonoscopy which was completely normal on September 11, 2021 with Dr. Wilfrid Lund (results reviewed). ? ?She saw eye doctor in recent months and they saw no specific worrisome findings. ? ?She saw a rheumatology twice recently.  They advised that she does not have any obvious signs of sarcoidosis currently but their concern was fibromyalgia.  They wanted to defer treatment to [place].  They mentioned having PCP putting her on gabapentin or Cymbalta. ? ?She notes muscle stiffness, muscle aches, has missed work several days in the last month due to pain, fatigue.  No numbness or tingling. ? ?She is using Naprosyn prescribed by rheumatoid but does not feel like that is helping too much. ? ?No other aggravating or relieving factors.   ? ?No other c/o. ? ?Past Medical History:  ?Diagnosis Date  ? Anemia   ? Arthritis   ? knees, hips  ? Complication of anesthesia   ? nausea and vomiting , dose well with patch  ? COVID-19 virus infection 08/07/2021  ? GERD (gastroesophageal reflux disease)   ? Headache   ? OAB (overactive bladder)   ? PONV (postoperative nausea and vomiting)   ? "patch works well."  ? Sarcoidosis 2019  ? Dr. Chesley Mires  ? ?Current Outpatient Medications on File Prior to Visit  ?Medication Sig Dispense Refill  ? levonorgestrel (MIRENA) 20 MCG/24HR IUD 1 each by Intrauterine  route once.    ? naproxen (NAPROSYN) 500 MG tablet Take 2 tablets by mouth daily.    ? Relugolix-Estradiol-Norethind (MYFEMBREE) 40-1-0.5 MG TABS Take 1 tablet by mouth daily at 12 noon. 30 tablet 2  ? ?No current facility-administered medications on file prior to visit.  ? ? ? ?The following portions of the patient's history were reviewed and updated as appropriate: allergies, current medications, past family history, past medical history, past social history, past surgical history and problem list. ? ?ROS ?Otherwise as in subjective above ? ?Objective: ?BP 120/70   Pulse 87   Wt 217 lb (98.4 kg)   BMI 39.06 kg/m?  ? ?General appearance: alert, no distress, well developed, well nourished ?Neck: supple, no lymphadenopathy, no thyromegaly, no masses ?Heart: RRR, normal S1, S2, no murmurs ?Lungs: CTA bilaterally, no wheezes, rhonchi, or rales ?Pulses: 2+ radial pulses, 2+ pedal pulses, normal cap refill ?Ext: no edema ? ? ?Assessment: ?Encounter Diagnoses  ?Name Primary?  ? Fibromyalgia Yes  ? Other fatigue   ? History of sarcoidosis   ? Arthralgia, unspecified joint   ? Eye irritation   ? ? ? ?Plan: ?I reviewed her February 2023 normal colonoscopy report ?I reviewed recent 2023 ophthalmology report.  They did not see any obvious current sarcoidosis findings in the eye ? ?Reviewed her rheumatology notes from February 2023.  The current going diagnosis is fibromyalgia.  I do not have her most recent office note that she notes that  they did not want to provide treatment regarding fibromyalgia. ? ?Begin trial gabapentin.  We discussed risk and benefits of medication, proper use of medication.  Begin short-term as needed use of Robaxin muscle laxer.  We discussed the benefits of exercise, regular stretching, consider cold therapy such as cold showers or cold water bath ? ?Avoid sugar and fried foods.  Eat fruits and vegetables and plenty of water daily. ? ?Follow-up in 1 month ? ?Haelee was seen today for discuss  treatment options. ? ?Diagnoses and all orders for this visit: ? ?Fibromyalgia ? ?Other fatigue ? ?History of sarcoidosis ? ?Arthralgia, unspecified joint ? ?Eye irritation ? ?Other orders ?-     gabapentin (NEURONTIN) 100 MG capsule; Take 1 capsule (100 mg total) by mouth daily. After 2 weeks can increase to 2 capsules daily ?-     methocarbamol (ROBAXIN) 500 MG tablet; Take 1 tablet (500 mg total) by mouth every 8 (eight) hours as needed for muscle spasms. ? ? ? ?Follow up: 65mo?

## 2021-11-20 ENCOUNTER — Telehealth: Payer: Self-pay

## 2021-11-20 DIAGNOSIS — F4320 Adjustment disorder, unspecified: Secondary | ICD-10-CM | POA: Diagnosis not present

## 2021-11-20 DIAGNOSIS — R69 Illness, unspecified: Secondary | ICD-10-CM | POA: Diagnosis not present

## 2021-11-20 NOTE — Telephone Encounter (Signed)
Pt called and states job requiring her to have intermittent FMLA due to chronic pain issues she has.  She is not taking FMLA to be out of work but in order for her not to lose her job she has to have FMLA for when she has to be out to cover her so she doesn't lose her job.  She states her job faxed the form to the office a couple days ago.  Pt states she will pay whatever form fees due

## 2021-12-01 NOTE — Telephone Encounter (Signed)
Pt called and said her job states her fmla is filled out wrong they state it needs intermittent duration dates on it, ?Put in your folder,  ?

## 2021-12-03 ENCOUNTER — Institutional Professional Consult (permissible substitution): Payer: 59 | Admitting: Neurology

## 2021-12-04 ENCOUNTER — Other Ambulatory Visit: Payer: Self-pay | Admitting: Medical

## 2021-12-04 MED ORDER — GABAPENTIN 300 MG PO CAPS: 300.0000 mg | ORAL_CAPSULE | Freq: Two times a day (BID) | ORAL | 2 refills | Status: DC

## 2021-12-04 NOTE — Addendum Note (Signed)
Addended by: Minette Headland A on: 12/04/2021 01:10 PM ? ? Modules accepted: Orders ? ?

## 2021-12-04 NOTE — Telephone Encounter (Signed)
Pt was notified.  

## 2021-12-04 NOTE — Telephone Encounter (Signed)
Pt called and states that she has already exceeded her time. She has been taking like 2 times a week instead of 1 time a month. She says she is sometimes in so much pain she can't work. She is trying to give the gabapentin time to work but wants to know can she possibly go ahead and increase to 2 tablets daily. She has only been taking it for a week. Also can you extend her time to like 2 times a week vs once a month. She doesn't want to get fired ?

## 2021-12-12 ENCOUNTER — Ambulatory Visit: Payer: 59 | Admitting: Medical

## 2022-01-13 DIAGNOSIS — M545 Low back pain, unspecified: Secondary | ICD-10-CM | POA: Diagnosis not present

## 2022-01-19 ENCOUNTER — Institutional Professional Consult (permissible substitution): Payer: 59 | Admitting: Neurology

## 2022-01-19 ENCOUNTER — Telehealth: Payer: Self-pay | Admitting: Neurology

## 2022-01-19 NOTE — Telephone Encounter (Signed)
LVM and mychart msg for need to reschedule 6/26 appt - MD out

## 2022-02-23 ENCOUNTER — Encounter: Payer: Self-pay | Admitting: Neurology

## 2022-02-23 ENCOUNTER — Ambulatory Visit (INDEPENDENT_AMBULATORY_CARE_PROVIDER_SITE_OTHER): Payer: 59 | Admitting: Neurology

## 2022-02-23 VITALS — BP 110/75 | HR 69 | Ht 62.0 in | Wt 218.4 lb

## 2022-02-23 DIAGNOSIS — G4719 Other hypersomnia: Secondary | ICD-10-CM

## 2022-02-23 DIAGNOSIS — R519 Headache, unspecified: Secondary | ICD-10-CM | POA: Diagnosis not present

## 2022-02-23 DIAGNOSIS — Z72820 Sleep deprivation: Secondary | ICD-10-CM

## 2022-02-23 DIAGNOSIS — R0683 Snoring: Secondary | ICD-10-CM

## 2022-02-23 DIAGNOSIS — R0681 Apnea, not elsewhere classified: Secondary | ICD-10-CM

## 2022-02-23 DIAGNOSIS — R351 Nocturia: Secondary | ICD-10-CM

## 2022-02-23 DIAGNOSIS — G4730 Sleep apnea, unspecified: Secondary | ICD-10-CM | POA: Diagnosis not present

## 2022-02-23 DIAGNOSIS — Z820 Family history of epilepsy and other diseases of the nervous system: Secondary | ICD-10-CM

## 2022-02-23 DIAGNOSIS — E669 Obesity, unspecified: Secondary | ICD-10-CM | POA: Diagnosis not present

## 2022-02-23 NOTE — Patient Instructions (Signed)

## 2022-02-23 NOTE — Progress Notes (Signed)
Subjective:    Patient ID: Sandra [name] is a 46 y.o. female.  HPI    Sandra Age, MD, PhD [facility] [address], [address].O. [place], [place] [address]  Dear Sandra [name],   I saw your patient, Sandra [name], upon your kind request in my sleep clinic today for initial consultation of her sleep disorder, in particular, concern for underlying obstructive sleep apnea.  The patient is unaccompanied today.  As you know, Sandra [name] is a 46 year old right-handed woman with an underlying medical history of sarcoidosis, overactive bladder, anemia, arthritis, reflux disease, and obesity, who reports snoring and excessive daytime somnolence as well as witnessed apneas, per BF.  I reviewed your office note from 08/11/2021.  Her Epworth sleepiness score is 21 out of 24, fatigue severity score is 23 out of 63.  She has seen rheumatology, she is also seen pulmonology for her sarcoidosis before.  She reports an erratic sleep schedule and admits that she does not sleep much.  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  She goes to bed around 5 PM and rise time is around 9 PM.  She has significant nocturia about 4-5 times at times.  She occasionally wakes up with a headache which is bilateral.  Sometimes she takes Tylenol for this.  She is a non-smoker, she drinks alcohol on the weekends, 2-3 shots at a time at the most.  She does not drink any sodas but drinks either 1 serving of coffee or 1 serving of tea per day.  She lives with her boyfriend, she has 2 grown children, ages 68 and 40.  They have no pets in the household.  She has woken up with a sense of gasping and coughing at night.  Her snoring can be disturbing especially when she sleeps on her back.  She tries to sleep on her sides.  Her mom has sleep apnea and had a PAP  machine before, patient is not sure if she currently uses 1.  Feeling sleepy at the wheel.  She has never fallen asleep at the wheel, she never had a car accident.  Her Past Medical History Is Significant For: Past Medical History:  Diagnosis Date   Anemia    Arthritis    knees, hips   Complication of anesthesia    nausea and vomiting , dose well with patch   COVID-19 virus infection 08/07/2021   GERD (gastroesophageal reflux disease)    Headache    OAB (overactive bladder)    PONV (postoperative nausea and vomiting)    "patch works well."   Sarcoidosis 2019   Dr. Chesley Mires    Her Past Surgical History Is Significant For: Past Surgical History:  Procedure Laterality Date   BREAST EXCISIONAL BIOPSY Right    2015   BREAST EXCISIONAL BIOPSY Left    2007 Papilloma removed   BREAST LUMPECTOMY WITH RADIOACTIVE SEED LOCALIZATION Left 04/08/2021   Procedure: LEFT BREAST LUMPECTOMY WITH RADIOACTIVE SEED LOCALIZATION;  Surgeon: Donnie Mesa, MD;  Location: [place];  Service: General;  Laterality: Left;   BREAST SURGERY     lt breast hematoma   CESAREAN SECTION     x 2   CHOLECYSTECTOMY N/A 06/28/2017   Procedure: LAPAROSCOPIC CHOLECYSTECTOMY;  Surgeon: Coralie Keens, MD;  Location: [place];  Service: General;  Laterality: N/A;   DILATATION & CURETTAGE/HYSTEROSCOPY WITH MYOSURE N/A 03/12/2021   Procedure: DILATATION & CURETTAGE/HYSTEROSCOPY;  Surgeon: Aletha Halim, MD;  Location: [place];  Service: Gynecology;  Laterality: N/A;   ESOPHAGOGASTRODUODENOSCOPY  05/2017   Dr. Collene Mares   HERNIA REPAIR     Umbilical Hernia   INTRAUTERINE DEVICE (IUD) INSERTION N/A 03/12/2021   Procedure: INTRAUTERINE DEVICE (IUD) INSERTION Mirena;  Surgeon: Aletha Halim, MD;  [facility];  Service: Gynecology;  Laterality: N/A;   IUD REMOVAL N/A 03/12/2021   Procedure: INTRAUTERINE DEVICE (IUD) REMOVAL;  Surgeon: Aletha Halim, MD;   Location: [place];  Service: Gynecology;  Laterality: N/A;   VIDEO BRONCHOSCOPY WITH ENDOBRONCHIAL ULTRASOUND N/A 09/28/2017   Procedure: VIDEO BRONCHOSCOPY WITH ENDOBRONCHIAL ULTRASOUND;  Surgeon: Collene Gobble, MD;  [facility];  Service: Thoracic;  Laterality: N/A;   WISDOM TOOTH EXTRACTION     2    Her Family History Is Significant For: Family History  Problem Relation [name] of Onset   Asthma Mother    Heart murmur Mother    Diabetes Mother    Sleep apnea Mother    Stomach cancer Maternal Grandmother    Cancer Maternal Grandmother        stomach   Cancer Maternal Grandfather        prostate   Cancer Paternal Grandmother        breast   Kidney disease Paternal Grandmother        dialysis   Sarcoidosis Neg Hx    Heart disease Neg Hx    Stroke Neg Hx    Breast cancer Neg Hx    Colon cancer Neg Hx    Colon polyps Neg Hx    Esophageal cancer Neg Hx    Rectal cancer Neg Hx     Her Social History Is Significant For: Social History   Socioeconomic History   Marital status: Significant Other    Spouse name: Not on file   Number of children: Not on file   Years of education: Not on file   Highest education level: Not on file  Occupational History   Not on file  Tobacco Use   Smoking status: Never   Smokeless tobacco: Never  Vaping Use   Vaping Use: Never used  Substance and Sexual Activity   Alcohol use: Yes    Alcohol/week: 3.0 standard drinks of alcohol    Types: 3 Shots of liquor per week    Comment: occasional   Drug use: Yes    Types: Marijuana    Comment: on occasion will smoke   Sexual activity: Yes    Birth control/protection: Surgical  Other Topics Concern   Not on file  Social History Narrative   Lives with boyfriend, 2 daughters.  Works for a lab, [employer], Diplomatic Services operational officer for veterinarians.  Exercise - goes to gym 2 days per week.   1.2023   Social Determinants of Health   Financial Resource Strain: Not on file  Food  Insecurity: Not on file  Transportation Needs: Not on file  Physical Activity: Not on file  Stress: Not on file  Social Connections: Not on file    Her Allergies Are:  Allergies  Allergen Reactions   Penicillins Rash and Other (See Comments)    PATIENT HAS HAD A PCN REACTION WITH IMMEDIATE RASH, FACIAL/TONGUE/THROAT SWELLING, SOB, OR LIGHTHEADEDNESS WITH HYPOTENSION:  #  #  #  YES  #  #  #  Has patient had a PCN reaction causing severe rash involving mucus membranes or skin necrosis: no Has patient had a PCN reaction that required hospitalization: no Has patient had a PCN reaction occurring within the last 10 years: no    Influenza Vaccines Other (See Comments)    UNSPECIFIED ILLNESS was sick for 1 yr after getting flu shot   Prednisone Other (See Comments)    Causes open sores   :   Her Current Medications Are:  Outpatient Encounter Medications as of 02/23/2022  Medication Sig   gabapentin (NEURONTIN) 300 MG capsule Take 1 capsule (300 mg total) by mouth 2 (two) times daily.   levonorgestrel (MIRENA) 20 MCG/24HR IUD 1 each by Intrauterine route once.   methocarbamol (ROBAXIN) 500 MG tablet Take 1 tablet (500 mg total) by mouth every 8 (eight) hours as needed for muscle spasms.   naproxen (NAPROSYN) 500 MG tablet Take 2 tablets by mouth daily.   Relugolix-Estradiol-Norethind (MYFEMBREE) 40-1-0.5 MG TABS Take 1 tablet by mouth daily at 12 noon.   No facility-administered encounter medications on file as of 02/23/2022.  :   Review of Systems:  Out of a complete 14 point review of systems, all are reviewed and negative with the exception of these symptoms as listed below:  Review of Systems  Neurological:        Pt here for sleep consult  Pt snores,fatigue,headaches. Pt denies  hypertension,sleep study,CPAP machine    ESS: 21 FSS:23    Objective:  Neurological Exam  Physical Exam Physical Examination:   Vitals:   02/23/22 0834  BP: 110/75  Pulse: 69    General  Examination: The patient is a very pleasant 46 y.o. female in no acute distress. She appears well-developed and well-nourished and well groomed.   HEENT: Normocephalic, atraumatic, pupils are equal, round and reactive to light, extraocular tracking is good without limitation to gaze excursion or nystagmus noted. Hearing is grossly intact. Face is symmetric with normal facial animation. Speech is clear with no dysarthria noted. There is no hypophonia. There is no lip, neck/head, jaw or voice tremor. Neck is supple with full range of passive and active motion. There are no carotid bruits on auscultation. Oropharynx exam reveals: mild mouth dryness, good dental hygiene and moderate airway crowding, due to small airway entry and redundant soft palate, uvula on the smaller side and tonsils on the smaller side but Mallampati class III.  Top retainer in place.  Tongue protrudes centrally, palate elevates symmetrically, neck circumference of 15-1/4 inches.  She has a moderate overbite.  Chest: Clear to auscultation without wheezing, rhonchi or crackles noted.  Heart: S1+S2+0, regular and normal without murmurs, rubs or gallops noted.   Abdomen: Soft, non-tender and non-distended.  Extremities: There is no obvious edema in the distal lower extremities bilaterally.   Skin: Warm and dry without trophic changes noted.   Musculoskeletal: exam reveals no obvious joint deformities, tenderness or joint swelling or erythema.   Neurologically:  Mental status: The patient is awake, alert and oriented in all 4 spheres. Her immediate and remote memory, attention, language skills and fund of knowledge are appropriate. There is no evidence of aphasia, agnosia, apraxia or anomia. Speech is clear with normal prosody and enunciation. Thought process is linear. Mood is normal and affect is normal.  Cranial nerves II - XII are as described above under HEENT exam.  Motor exam: Normal bulk, strength and tone is noted. There is  no obvious tremor. Fine motor skills and coordination:  grossly intact.  Cerebellar testing: No dysmetria or intention tremor. There is no truncal or gait ataxia.  Sensory exam: intact to light touch in the upper and lower extremities.  Gait, station and balance: She stands easily. No veering to one side is noted. No leaning to one side is noted. Posture is [name]-appropriate and stance is narrow based. Gait shows normal stride length and normal pace. No problems turning are noted.   Assessment and Plan:   In summary, Sandra [name] is a very pleasant 46 y.o.-year old female with an underlying medical history of sarcoidosis, overactive bladder, anemia, arthritis, reflux disease, and obesity, whose history and physical exam are concerning for sleep disordered breathing, supporting a current working diagnosis of unspecified sleep apnea, with the main differential diagnoses of obstructive sleep apnea (OSA) versus upper airway resistance syndrome (UARS) versus central sleep apnea (CSA), or mixed sleep apnea. A laboratory attended sleep study is considered gold standard for evaluation of sleep disordered breathing and is recommended at this time and clinically justified.   I had a long chat with the patient about my findings and the diagnosis of sleep apnea, particularly OSA, its prognosis and treatment options. We talked about medical/conservative treatments, surgical interventions and non-pharmacological approaches for symptom control. I explained, in particular, the risks and ramifications of untreated moderate to severe OSA, especially with respect to developing cardiovascular disease down the road, including congestive heart failure (CHF), difficult to treat hypertension, cardiac arrhythmias (particularly A-fib), neurovascular complications including TIA, stroke and dementia. Even type 2 diabetes has, in part, been linked to untreated OSA. Symptoms of untreated OSA may include (but may not be limited to)  daytime sleepiness, nocturia (i.e. frequent nighttime urination), memory problems, mood irritability and suboptimally controlled or worsening mood disorder such as depression and/or anxiety, lack of energy, lack of motivation, physical discomfort, as well as recurrent headaches, especially morning or nocturnal headaches. We talked about the importance of maintaining a healthy lifestyle and striving for healthy weight.  In addition, we talked about the importance of striving for and maintaining good sleep hygiene.  We talked about the importance of making enough time for sleep.  Chronic sleep deprivation is one of them most frequent causes of sleepiness.  She is advised not to drive when feeling sleepy and try to make enough time for sleep, 7 to 8 hours are generally recommended for the average adult. I recommended the following at this time: sleep study.  I outlined the differences between a laboratory attended sleep study which is considered more comprehensive and accurate over the option of a home sleep test (HST); the latter may lead to underestimation of sleep disordered breathing in some instances and does not help with diagnosing upper airway resistance syndrome and is not accurate enough to diagnose primary central sleep apnea typically. I explained the different sleep test procedures to the patient in detail and also outlined possible surgical and non-surgical treatment options of OSA, including the use of a pressure airway pressure (PAP) device (ie CPAP, AutoPAP/APAP or BiPAP in certain circumstances), a custom-made dental device (aka oral appliance, which would require a referral to a specialist dentist or orthodontist typically, and is generally speaking not considered a good choice for patients with full dentures or edentulous state), upper airway surgical options, such as traditional UPPP (which is not considered a first-line treatment) or the Inspire device (hypoglossal nerve stimulator, which would  involve a referral for consultation with an ENT surgeon, after careful selection, following inclusion criteria). I explained  the PAP treatment option to the patient in detail, as this is generally considered first-line treatment.  The patient indicated that she would be willing to try PAP therapy, if the need arises. I explained the importance of being compliant with PAP treatment, not only for insurance purposes but primarily to improve patient's symptoms symptoms, and for the patient's long term health benefit, including to reduce Her cardiovascular risks longer-term.    We will pick up our discussion about the next steps and treatment options after testing.  We will keep her posted as to the test results by phone call and/or MyChart messaging where possible.  We will plan to follow-up in sleep clinic accordingly as well.  I answered all her questions today and the patient was in agreement.   I encouraged her to call with any interim questions, concerns, problems or updates or email [place] through [facility].  Generally speaking, sleep test authorizations may take up to 2 weeks, sometimes less, sometimes longer, the patient is encouraged to get in touch with [place] if they do not hear back from the sleep lab staff directly within the next 2 weeks.  Thank you very much for allowing me to participate in the care of this nice patient. If I can be of any further assistance to you please do not hesitate to call me at [phone].  Sincerely,   Sandra Age, MD, PhD

## 2022-03-24 ENCOUNTER — Telehealth: Payer: Self-pay | Admitting: Neurology

## 2022-03-24 NOTE — Telephone Encounter (Signed)
[employer] pending faxed notes

## 2022-03-26 NOTE — Telephone Encounter (Signed)
Checked status on the portal it is still pending.  

## 2022-04-01 ENCOUNTER — Encounter: Payer: Self-pay | Admitting: Internal Medicine

## 2022-04-01 NOTE — Telephone Encounter (Signed)
[employer] denied the NPSG, but when I called [facility] they informed me that the planned had termed on 03/26/22. I sent the patient a mychart message.

## 2022-05-05 ENCOUNTER — Encounter: Payer: Self-pay | Admitting: Internal Medicine

## 2022-07-27 DIAGNOSIS — Z975 Presence of (intrauterine) contraceptive device: Secondary | ICD-10-CM

## 2022-07-27 HISTORY — DX: Presence of (intrauterine) contraceptive device: Z97.5

## 2022-08-12 ENCOUNTER — Encounter: Payer: 59 | Admitting: Medical

## 2022-09-08 ENCOUNTER — Other Ambulatory Visit: Payer: Self-pay | Admitting: Obstetrics & Gynecology

## 2022-09-08 DIAGNOSIS — N939 Abnormal uterine and vaginal bleeding, unspecified: Secondary | ICD-10-CM

## 2022-09-08 DIAGNOSIS — D219 Benign neoplasm of connective and other soft tissue, unspecified: Secondary | ICD-10-CM

## 2022-09-08 DIAGNOSIS — G8929 Other chronic pain: Secondary | ICD-10-CM

## 2022-09-08 MED ORDER — MYFEMBREE 40-1-0.5 MG PO TABS: 1.0000 | ORAL_TABLET | Freq: Every day | ORAL | 12 refills | Status: DC

## 2022-09-11 ENCOUNTER — Telehealth: Payer: Self-pay

## 2022-09-11 NOTE — Telephone Encounter (Signed)
Called pt in regards to her MyFembree medication.  I informed pt that I spoke with someone from [facility] and was advised that if she goes to the website she will be able to get a discount code that will make the medication more affordable.  I advised pt that [facility] should [facility] to [facility] to speak with someone at the pharmacy to assist her and if she continues to have issues to please give the office a call back.  Pt verbalized understanding.    Frances Nickels  09/11/22

## 2022-10-01 ENCOUNTER — Encounter: Payer: Self-pay | Admitting: General Practice

## 2022-10-01 NOTE — Progress Notes (Unsigned)
PA request completed in covermymeds for myfembree- pending status.

## 2022-11-11 ENCOUNTER — Other Ambulatory Visit: Payer: Self-pay | Admitting: Medical

## 2022-11-11 NOTE — Telephone Encounter (Signed)
Pt has an appt 11/20/22. I will refill medication

## 2022-11-17 ENCOUNTER — Ambulatory Visit (INDEPENDENT_AMBULATORY_CARE_PROVIDER_SITE_OTHER): Payer: 59 | Admitting: Obstetrics and Gynecology

## 2022-11-17 ENCOUNTER — Other Ambulatory Visit: Payer: Self-pay

## 2022-11-17 ENCOUNTER — Encounter: Payer: Self-pay | Admitting: Obstetrics and Gynecology

## 2022-11-17 VITALS — BP 133/83 | HR 91 | Wt 213.5 lb

## 2022-11-17 DIAGNOSIS — R102 Pelvic and perineal pain: Secondary | ICD-10-CM | POA: Diagnosis not present

## 2022-11-17 DIAGNOSIS — G8929 Other chronic pain: Secondary | ICD-10-CM | POA: Diagnosis not present

## 2022-11-17 DIAGNOSIS — D219 Benign neoplasm of connective and other soft tissue, unspecified: Secondary | ICD-10-CM

## 2022-11-17 MED ORDER — METHOCARBAMOL 500 MG PO TABS: 500.0000 mg | ORAL_TABLET | Freq: Three times a day (TID) | ORAL | 1 refills | Status: DC | PRN

## 2022-11-17 NOTE — Progress Notes (Signed)
GYNECOLOGY VISIT  Patient name: Sandra [name] MRN [id number]  Date of birth: 1975/08/11 Chief Complaint:   Gynecologic Exam   History:  Sandra [name] is a 47 y.o. G2P2000 being seen today for CPP.  Last seen 2023 - prescribed myfembree with IUD in place. Taking myfembree still but feels not working as well for the last 2 months. Not having bleeding w/ pain but may spot intermittently, typically when stressed. Feels like bad cramps and constant and may wake her up out of sleep. May improve w/ standing.  Feels like pain she has had before but worse - pulsating cramping pain  1 month without the myfembree on board  About 1 month before she started feeling better No IUD strings felt Not sexually active, 2 months but not due to pain  Prior interventions: NSAIDs BM and voiding frequently, hx of OAB (stopped taking medication due to dry mouth/side effects) Has not been on muscle relaxer in over a year No prior pelvic physical therapy    Past Medical History:  Diagnosis Date   Anemia    Arthritis    knees, hips   Complication of anesthesia    nausea and vomiting , dose well with patch   COVID-19 virus infection 08/07/2021   GERD (gastroesophageal reflux disease)    Headache    OAB (overactive bladder)    PONV (postoperative nausea and vomiting)    "patch works well."   Sarcoidosis 2019   Dr. Coralyn Helling    Past Surgical History:  Procedure Laterality Date   BREAST EXCISIONAL BIOPSY Right    2015   BREAST EXCISIONAL BIOPSY Left    2007 Papilloma removed   BREAST LUMPECTOMY WITH RADIOACTIVE SEED LOCALIZATION Left 04/08/2021   Procedure: LEFT BREAST LUMPECTOMY WITH RADIOACTIVE SEED LOCALIZATION;  Surgeon: Manus Rudd, MD;  Location: [facility];  Service: General;  Laterality: Left;   BREAST SURGERY     lt breast hematoma   CESAREAN SECTION     x 2   CHOLECYSTECTOMY N/A 06/28/2017   Procedure: LAPAROSCOPIC CHOLECYSTECTOMY;  Surgeon: Abigail Miyamoto,  MD;  [facility];  Service: General;  Laterality: N/A;   DILATATION & CURETTAGE/HYSTEROSCOPY WITH MYOSURE N/A 03/12/2021   Procedure: DILATATION & CURETTAGE/HYSTEROSCOPY;  Surgeon: Elberton Bing, MD;  [facility];  Service: Gynecology;  Laterality: N/A;   ESOPHAGOGASTRODUODENOSCOPY  05/2017   Dr. Loreta Ave   HERNIA REPAIR     Umbilical Hernia   INTRAUTERINE DEVICE (IUD) INSERTION N/A 03/12/2021   Procedure: INTRAUTERINE DEVICE (IUD) INSERTION Mirena;  Surgeon: Dublin Bing, MD;  Location: [facility];  Service: Gynecology;  Laterality: N/A;   IUD REMOVAL N/A 03/12/2021   Procedure: INTRAUTERINE DEVICE (IUD) REMOVAL;  Surgeon: Knowlton Bing, MD;  Location: [facility];  Service: Gynecology;  Laterality: N/A;   VIDEO BRONCHOSCOPY WITH ENDOBRONCHIAL ULTRASOUND N/A 09/28/2017   Procedure: VIDEO BRONCHOSCOPY WITH ENDOBRONCHIAL ULTRASOUND;  Surgeon: Leslye Peer, MD;  [facility];  Service: Thoracic;  Laterality: N/A;   WISDOM TOOTH EXTRACTION     2    The following portions of the patient's history were reviewed and updated as appropriate: allergies, current medications, past family history, past medical history, past social history, past surgical history and problem list.   Health Maintenance:   Last pap     Component Value Date/Time   DIAGPAP  01/15/2021 0904    - Negative for intraepithelial lesion or malignancy (NILM)   DIAGPAP  09/01/2017 0000  NEGATIVE FOR INTRAEPITHELIAL LESIONS OR MALIGNANCY.   DIAGPAP  09/01/2017 0000    FUNGAL ORGANISMS PRESENT CONSISTENT WITH CANDIDA SPP.   HPVHIGH Negative 01/15/2021 0904   ADEQPAP  01/15/2021 0904    Satisfactory for evaluation; transformation zone component PRESENT.   ADEQPAP  09/01/2017 0000    Satisfactory for evaluation  endocervical/transformation zone component PRESENT.    High Risk HPV: Positive  Adequacy:  Satisfactory for evaluation, transformation zone  component PRESENT  Diagnosis:  Atypical squamous cells of undetermined significance (ASC-US)  Last mammogram: 2022 biopsy with intraductal papilloma   Review of Systems:  Pertinent items are noted in HPI. Comprehensive review of systems was otherwise negative.   Objective:  Physical Exam There were no vitals taken for this visit.   Physical Exam Vitals and nursing note reviewed. Exam conducted with a chaperone present.  Constitutional:      Appearance: Normal appearance.  HENT:     Head: Normocephalic and atraumatic.  Pulmonary:     Effort: Pulmonary effort is normal.     Breath sounds: Normal breath sounds.  Abdominal:     Palpations: Abdomen is soft.     Tenderness: There is no abdominal tenderness.       Comments: Tenderness along   Genitourinary:    General: Normal vulva.     Exam position: Lithotomy position.     Vagina: Normal.     Cervix: Normal.     Comments: Normal appearing vulva Normal vulvar sensation bilaterally Nontender superficial pelvic floor muscles Nontender ischial tuberosities bilaterally  Allodynia at introitus: Yes: discomfort throughout posterior fourchette  Right levator ani 6/10 Right ischiococcygeous 8/10 Right obturator internus 8/10 Left levator ani 1/10 Left ischioccocygeous 1/10 Left obturator internus 1/10 Anterior vaginal wall nontender Uterine tenderness nontender No dryness   Skin:    General: Skin is warm and dry.  Neurological:     General: No focal deficit present.     Mental Status: She is alert.  Psychiatric:        Mood and Affect: Mood normal.        Behavior: Behavior normal.        Thought Content: Thought content normal.        Judgment: Judgment normal.         Assessment & Plan:   1. Chronic female pelvic pain  Continue myfembree and start muscle relaxer for abdominopelvic myalgia. Referral to PFPT. Assess response to additional therapies. LNG-IUD seems to be controlling bleeding well. Discussed that  hysterectomy would not necessarily resolve pain as it seems mostly MSK at this time. Does not appear to have adverse side effects from [facility] - reviewed may take a few weeks to see improvement with restarting myfembree. Given improvement w/ myfembree previously, may be role for hysterectomy as well but noted that there may be initial flare of pain following surgery.  - US PELVIC COMPLETE WITH TRANSVAGINAL; Future - Ambulatory referral to Physical Therapy - methocarbamol (ROBAXIN) 500 MG tablet; Take 1 tablet (500 mg total) by mouth every 8 (eight) hours as needed for muscle spasms.  Dispense: 60 tablet; Refill: 1  2. Fibroids Pelvic US to assess for interval change in fibroids.  - US PELVIC COMPLETE WITH TRANSVAGINAL; Future   Routine preventative health maintenance measures emphasized.  Lorriane Shire, MD Minimally Invasive Gynecologic [facility] for [facility], [facility]

## 2022-11-20 ENCOUNTER — Encounter: Payer: Self-pay | Admitting: Medical

## 2022-11-20 ENCOUNTER — Ambulatory Visit (INDEPENDENT_AMBULATORY_CARE_PROVIDER_SITE_OTHER): Payer: 59 | Admitting: Medical

## 2022-11-20 VITALS — BP 110/70 | HR 88 | Ht 62.75 in | Wt 216.0 lb

## 2022-11-20 DIAGNOSIS — M797 Fibromyalgia: Secondary | ICD-10-CM

## 2022-11-20 DIAGNOSIS — Z131 Encounter for screening for diabetes mellitus: Secondary | ICD-10-CM | POA: Diagnosis not present

## 2022-11-20 DIAGNOSIS — Z13220 Encounter for screening for lipoid disorders: Secondary | ICD-10-CM

## 2022-11-20 DIAGNOSIS — Z862 Personal history of diseases of the blood and blood-forming organs and certain disorders involving the immune mechanism: Secondary | ICD-10-CM

## 2022-11-20 DIAGNOSIS — M25511 Pain in right shoulder: Secondary | ICD-10-CM

## 2022-11-20 DIAGNOSIS — Z0000 Encounter for general adult medical examination without abnormal findings: Secondary | ICD-10-CM | POA: Diagnosis not present

## 2022-11-20 DIAGNOSIS — Z975 Presence of (intrauterine) contraceptive device: Secondary | ICD-10-CM | POA: Diagnosis not present

## 2022-11-20 NOTE — Progress Notes (Signed)
Subjective:   HPI  Sandra [name] is a 47 y.o. female who presents for Chief Complaint  Patient presents with   Annual Exam    Nonfasting annual exam. Patient sees Dr. Nile Riggs once yearly. Could not give UA, went in waiting room. Right should hurts to lift and sleep on.     Patient Care Team: Lundy Cozart, Cleda Mccreedy as PCP - General (Family Medicine) Dr. Louanna Raw, eye doctor Dentist Dr. Nita Sickle, neurology Dr. Manus Rudd, general surgery Dr. Coral Bing, gynecology Dr. Ernestene Kiel, podiatry Dr. Pollyann Savoy, rheumatology Dr. Amada Jupiter, GI   Concerns: Having problems with right shoulder x 3 months. Can't lift arm above head.  Thinks she may have injured her shoulder picking up something.   Has had some tingling in arm.  Started in lower arm but that has progressed to shoulder.  Has used some tylenol.   Reviewed their medical, surgical, family, social, medication, and allergy history and updated chart as appropriate.  Past Medical History:  Diagnosis Date   Anemia    Arthritis    knees, hips   Complication of anesthesia    nausea and vomiting , dose well with patch   COVID-19 virus infection 08/07/2021   GERD (gastroesophageal reflux disease)    Headache    IUD (intrauterine device) in place 2024   OAB (overactive bladder)    PONV (postoperative nausea and vomiting)    "patch works well."   Sarcoidosis 2019   Dr. Coralyn Helling   Uterine fibroid     Family History  Problem Relation Age of Onset   Asthma Mother    Heart murmur Mother    Diabetes Mother    Sleep apnea Mother    Stomach cancer Maternal Grandmother    Cancer Maternal Grandmother        stomach   Cancer Maternal Grandfather        prostate   Cancer Paternal Grandmother        breast   Kidney disease Paternal Grandmother        dialysis   Sarcoidosis Neg Hx    Heart disease Neg Hx    Stroke Neg Hx    Breast cancer Neg Hx    Colon cancer Neg Hx    Colon polyps Neg Hx    Esophageal  cancer Neg Hx    Rectal cancer Neg Hx      Current Outpatient Medications:    gabapentin (NEURONTIN) 300 MG capsule, TAKE 1 CAPSULE TWICE DAILY, Disp: 60 capsule, Rfl: 0   levonorgestrel (MIRENA) 20 MCG/24HR IUD, 1 each by Intrauterine route once., Disp: , Rfl:    methocarbamol (ROBAXIN) 500 MG tablet, Take 1 tablet (500 mg total) by mouth every 8 (eight) hours as needed for muscle spasms., Disp: 60 tablet, Rfl: 1   Multiple Vitamins-Minerals (HAIR SKIN & NAILS PO), Take 3 tablets by mouth daily., Disp: , Rfl:    Relugolix-Estradiol-Norethind (MYFEMBREE) 40-1-0.5 MG TABS, Take 1 tablet by mouth daily at 12 noon., Disp: 30 tablet, Rfl: 12   naproxen (NAPROSYN) 500 MG tablet, Take 2 tablets by mouth daily. (Patient not taking: Reported on 11/20/2022), Disp: , Rfl:   Allergies  Allergen Reactions   Penicillins Rash and Other (See Comments)    PATIENT HAS HAD A PCN REACTION WITH IMMEDIATE RASH, FACIAL/TONGUE/THROAT SWELLING, SOB, OR LIGHTHEADEDNESS WITH HYPOTENSION:  #  #  #  YES  #  #  #   Has patient had a PCN reaction causing severe rash  involving mucus membranes or skin necrosis: no Has patient had a PCN reaction that required hospitalization: no Has patient had a PCN reaction occurring within the last 10 years: no    Influenza Vaccines Other (See Comments)    UNSPECIFIED ILLNESS was sick for 1 yr after getting flu shot   Prednisone Other (See Comments)    Causes open sores     Review of Systems  Constitutional:  Negative for chills, fever, malaise/fatigue and weight loss.  HENT:  Negative for congestion, ear pain, hearing loss, sore throat and tinnitus.   Eyes:  Negative for blurred vision, pain and redness.  Respiratory:  Negative for cough, hemoptysis and shortness of breath.   Cardiovascular:  Negative for chest pain, palpitations, orthopnea, claudication and leg swelling.  Gastrointestinal:  Negative for abdominal pain, blood in stool, constipation, diarrhea, nausea and vomiting.   Genitourinary:  Negative for dysuria, flank pain, frequency, hematuria and urgency.  Musculoskeletal:  Positive for joint pain. Negative for falls and myalgias.  Skin:  Negative for itching and rash.  Neurological:  Negative for dizziness, tingling, speech change, weakness and headaches.  Endo/Heme/Allergies:  Negative for polydipsia. Does not bruise/bleed easily.  Psychiatric/Behavioral:  Negative for depression and memory loss. The patient is not nervous/anxious and does not have insomnia.          11/20/2022    1:33 PM 11/14/2021    9:08 AM 10/10/2021    8:25 AM 08/11/2021   10:10 AM 04/18/2021    2:32 PM  Depression screen PHQ 2/9  Decreased Interest 0 0 0 0 0  Down, Depressed, Hopeless 0 0 0 0 0  PHQ - 2 Score 0 0 0 0 0  Altered sleeping   0    Tired, decreased energy   0    Change in appetite   0    Feeling bad or failure about yourself    0    Trouble concentrating   0    Moving slowly or fidgety/restless   0    Suicidal thoughts   0    PHQ-9 Score   0         Objective:  BP 110/70   Pulse 88   Ht 5' 2.75" (1.594 m)   Wt 216 lb (98 kg)   SpO2 99%   BMI 38.57 kg/m   General appearance: alert, no distress, WD/WN, African American female Skin: no concerning lesions HEENT: normocephalic, conjunctiva/corneas normal, sclerae anicteric, bilat upper and lower eye lids seems puffy but no redness, no drainage.  PERRLA, EOMi Neck: supple, no lymphadenopathy, no thyromegaly, no masses, normal ROM, no bruits Chest: non tender, normal shape and expansion Heart: RRR, normal S1, S2, no murmurs Lungs: CTA bilaterally, no wheezes, rhonchi, or rales Abdomen: +bs, soft, non tender, non distended, no masses, no hepatomegaly, no splenomegaly, no bruits Back: non tender, normal ROM, no scoliosis Musculoskeletal: Tender over right biceps origin, she seems to be somewhat weak in the right shoulder compared to the left but likely due to pain, unable to abduct or flex her shoulder much  more than 40 degrees, pain with any resisted shoulder movement today, she can barely do external rotation at all of the shoulder, decreased internal rotation, a little bit of a step-off on exam compared to the left shoulder, rest of the arm nontender without obvious deformity lower extremities non tender, no obvious deformity, normal ROM throughout Extremities: no edema, no cyanosis, no clubbing Pulses: 2+ symmetric, upper and lower extremities, normal cap  refill Neurological: alert, oriented x 3, CN2-12 intact, strength normal upper extremities and lower extremities, sensation normal throughout, DTRs 2+ throughout, no cerebellar signs, gait normal Psychiatric: normal affect, behavior normal, pleasant  Breast/gyn/rectal - deferred to gynecology     Assessment and Plan :   Encounter Diagnoses  Name Primary?   Encounter for health maintenance examination in adult Yes   Right shoulder pain, unspecified chronicity    Fibromyalgia    Screening for lipid disorders    Screening for diabetes mellitus    IUD (intrauterine device) in place    History of sarcoidosis      This visit was a preventative care visit, also known as wellness visit or routine physical.   Topics typically include healthy lifestyle, diet, exercise, preventative care, vaccinations, sick and well care, proper use of emergency dept and after hours care, as well as other concerns.     Recommendations: Continue to return yearly for your annual wellness and preventative care visits.  This gives [place] a chance to discuss healthy lifestyle, exercise, vaccinations, review your chart record, and perform screenings where appropriate.  I recommend you see your eye doctor yearly for routine vision care.  I recommend you see your dentist yearly for routine dental care including hygiene visits twice yearly.   Vaccination recommendations were reviewed Immunization History  Administered Date(s) Administered   Pneumococcal  Polysaccharide-23 07/31/2019   Tdap 07/31/2019     Screening for cancer: Colon cancer screening: 08/2021 normal colonoscopy reviewed   Breast cancer screening: You should perform a self breast exam monthly.   We reviewed recommendations for regular mammograms and breast cancer screening.  Cervical cancer screening: We reviewed recommendations for pap smear screening.  Pap reviewed and is up to date   Skin cancer screening: Check your skin regularly for new changes, growing lesions, or other lesions of concern Come in for evaluation if you have skin lesions of concern.  Lung cancer screening: If you have a greater than 20 pack year history of tobacco use, then you may qualify for lung cancer screening with a chest CT scan.   Please call your insurance company to inquire about coverage for this test.  We currently don't have screenings for other cancers besides breast, cervical, colon, and lung cancers.  If you have a strong family history of cancer or have other cancer screening concerns, please let me know.    Bone health: Get at least 150 minutes of aerobic exercise weekly Get weight bearing exercise at least once weekly Bone density test:  A bone density test is an imaging test that uses a type of X-ray to measure the amount of calcium and other minerals in your bones. The test may be used to diagnose or screen you for a condition that causes weak or thin bones (osteoporosis), predict your risk for a broken bone (fracture), or determine how well your osteoporosis treatment is working. The bone density test is recommended for females 65 and older, or females or males <65 if certain risk factors such as thyroid disease, long term use of steroids such as for asthma or rheumatological issues, vitamin D deficiency, estrogen deficiency, family history of osteoporosis, self or family history of fragility fracture in first degree relative.    Heart health: Get at least 150 minutes of  aerobic exercise weekly Limit alcohol It is important to maintain a healthy blood pressure and healthy cholesterol numbers  Heart disease screening: Screening for heart disease includes screening for blood pressure, fasting lipids,  glucose/diabetes screening, BMI height to weight ratio, reviewed of smoking status, physical activity, and diet.    Goals include blood pressure 120/80 or less, maintaining a healthy lipid/cholesterol profile, preventing diabetes or keeping diabetes numbers under good control, not smoking or using tobacco products, exercising most days per week or at least 150 minutes per week of exercise, and eating healthy variety of fruits and vegetables, healthy oils, and avoiding unhealthy food choices like fried food, fast food, high sugar and high cholesterol foods.    Other tests may possibly include EKG test, CT coronary calcium score, echocardiogram, exercise treadmill stress test.    Medical care options: I recommend you continue to seek care here first for routine care.  We try really hard to have available appointments Monday through Friday daytime hours for sick visits, acute visits, and physicals.  Urgent care should be used for after hours and weekends for significant issues that cannot wait till the next day.  The emergency department should be used for significant potentially life-threatening emergencies.  The emergency department is expensive, can often have long wait times for less significant concerns, so try to utilize primary care, urgent care, or telemedicine when possible to avoid unnecessary trips to the emergency department.  Virtual visits and telemedicine have been introduced since the pandemic started in 2020, and can be convenient ways to receive medical care.  We offer virtual appointments as well to assist you in a variety of options to seek medical care.    Separate significant issues discussed: Right shoulder pain, quite decreased ROM, referral to  ortho   Bita was seen today for annual exam.  Diagnoses and all orders for this visit:  Encounter for health maintenance examination in adult -     Ambulatory referral to Orthopedics -     Cancel: Comprehensive metabolic panel -     Cancel: CBC -     Cancel: Lipid panel -     Cancel: Hemoglobin A1c -     Cancel: VITAMIN D 25 Hydroxy (Vit-D Deficiency, Fractures) -     Comprehensive metabolic panel; Future -     CBC; Future -     Lipid panel; Future -     Hemoglobin A1c; Future -     VITAMIN D 25 Hydroxy (Vit-D Deficiency, Fractures); Future  Right shoulder pain, unspecified chronicity -     Ambulatory referral to Orthopedics  Fibromyalgia  Screening for lipid disorders -     Cancel: Lipid panel -     Lipid panel; Future  Screening for diabetes mellitus -     Cancel: Hemoglobin A1c -     Hemoglobin A1c; Future  IUD (intrauterine device) in place  History of sarcoidosis    Follow-up pending labs, yearly for physical

## 2022-11-23 ENCOUNTER — Other Ambulatory Visit: Payer: 59

## 2022-11-23 DIAGNOSIS — Z0000 Encounter for general adult medical examination without abnormal findings: Secondary | ICD-10-CM

## 2022-11-23 DIAGNOSIS — Z131 Encounter for screening for diabetes mellitus: Secondary | ICD-10-CM | POA: Diagnosis not present

## 2022-11-23 DIAGNOSIS — Z13220 Encounter for screening for lipoid disorders: Secondary | ICD-10-CM | POA: Diagnosis not present

## 2022-11-24 ENCOUNTER — Other Ambulatory Visit: Payer: Self-pay | Admitting: Medical

## 2022-11-24 LAB — LIPID PANEL
Chol/HDL Ratio: 3.1 ratio (ref 0.0–4.4)
Cholesterol, Total: 175 mg/dL (ref 100–199)
HDL: 56 mg/dL (ref >39)
LDL Chol Calc (NIH): 112 mg/dL — ABNORMAL HIGH (ref 0–99)
Triglycerides: 33 mg/dL (ref 0–149)
VLDL Cholesterol Cal: 7 mg/dL (ref 5–40)

## 2022-11-24 LAB — COMPREHENSIVE METABOLIC PANEL
ALT: 12 IU/L (ref 0–32)
AST: 14 IU/L (ref 0–40)
Albumin/Globulin Ratio: 1.6 (ref 1.2–2.2)
Albumin: 4.1 g/dL (ref 3.9–4.9)
Alkaline Phosphatase: 56 IU/L (ref 44–121)
BUN/Creatinine Ratio: 17 (ref 9–23)
BUN: 14 mg/dL (ref 6–24)
Bilirubin Total: 0.3 mg/dL (ref 0.0–1.2)
CO2: 20 mmol/L (ref 20–29)
Calcium: 8.8 mg/dL (ref 8.7–10.2)
Chloride: 104 mmol/L (ref 96–106)
Creatinine, Ser: 0.82 mg/dL (ref 0.57–1.00)
Globulin, Total: 2.5 g/dL (ref 1.5–4.5)
Glucose: 97 mg/dL (ref 70–99)
Potassium: 4.4 mmol/L (ref 3.5–5.2)
Sodium: 139 mmol/L (ref 134–144)
Total Protein: 6.6 g/dL (ref 6.0–8.5)
eGFR: 89 mL/min/{1.73_m2} (ref >59)

## 2022-11-24 LAB — CBC
Hematocrit: 35.8 % (ref 34.0–46.6)
Hemoglobin: 12.2 g/dL (ref 11.1–15.9)
MCH: 30.3 pg (ref 26.6–33.0)
MCHC: 34.1 g/dL (ref 31.5–35.7)
MCV: 89 fL (ref 79–97)
Platelets: 248 10*3/uL (ref 150–450)
RBC: 4.02 x10E6/uL (ref 3.77–5.28)
RDW: 12 % (ref 11.7–15.4)
WBC: 4.2 10*3/uL (ref 3.4–10.8)

## 2022-11-24 LAB — HEMOGLOBIN A1C
Est. average glucose Bld gHb Est-mCnc: 120 mg/dL
Hgb A1c MFr Bld: 5.8 % — ABNORMAL HIGH (ref 4.8–5.6)

## 2022-11-24 LAB — VITAMIN D 25 HYDROXY (VIT D DEFICIENCY, FRACTURES): Vit D, 25-Hydroxy: 20.3 ng/mL — ABNORMAL LOW (ref 30.0–100.0)

## 2022-11-24 MED ORDER — VITAMIN D (ERGOCALCIFEROL) 1.25 MG (50000 UNIT) PO CAPS: 50000.0000 [IU] | ORAL_CAPSULE | ORAL | 0 refills | Status: AC

## 2022-11-24 NOTE — Progress Notes (Signed)
Results sent through MyChart

## 2022-11-25 ENCOUNTER — Ambulatory Visit (HOSPITAL_COMMUNITY)
Admission: RE | Admit: 2022-11-25 | Discharge: 2022-11-25 | Disposition: A | Discharge status: Home / Self Care | Payer: 59 | Source: Ambulatory Visit | Attending: Obstetrics and Gynecology | Admitting: Obstetrics and Gynecology

## 2022-11-25 DIAGNOSIS — D219 Benign neoplasm of connective and other soft tissue, unspecified: Secondary | ICD-10-CM

## 2022-11-25 DIAGNOSIS — G8929 Other chronic pain: Secondary | ICD-10-CM | POA: Insufficient documentation

## 2022-11-25 DIAGNOSIS — R102 Pelvic and perineal pain: Secondary | ICD-10-CM | POA: Diagnosis not present

## 2022-11-25 DIAGNOSIS — D252 Subserosal leiomyoma of uterus: Secondary | ICD-10-CM | POA: Diagnosis not present

## 2022-12-07 ENCOUNTER — Other Ambulatory Visit (INDEPENDENT_AMBULATORY_CARE_PROVIDER_SITE_OTHER): Payer: 59

## 2022-12-07 ENCOUNTER — Ambulatory Visit: Payer: 59 | Admitting: Orthopedic Surgery

## 2022-12-07 DIAGNOSIS — Z809 Family history of malignant neoplasm, unspecified: Secondary | ICD-10-CM | POA: Diagnosis not present

## 2022-12-07 DIAGNOSIS — M5412 Radiculopathy, cervical region: Secondary | ICD-10-CM | POA: Diagnosis not present

## 2022-12-07 DIAGNOSIS — Z6839 Body mass index (BMI) 39.0-39.9, adult: Secondary | ICD-10-CM | POA: Diagnosis not present

## 2022-12-07 DIAGNOSIS — M064 Inflammatory polyarthropathy: Secondary | ICD-10-CM | POA: Diagnosis not present

## 2022-12-07 DIAGNOSIS — M791 Myalgia, unspecified site: Secondary | ICD-10-CM | POA: Diagnosis not present

## 2022-12-07 DIAGNOSIS — G629 Polyneuropathy, unspecified: Secondary | ICD-10-CM | POA: Diagnosis not present

## 2022-12-07 DIAGNOSIS — N809 Endometriosis, unspecified: Secondary | ICD-10-CM | POA: Diagnosis not present

## 2022-12-07 DIAGNOSIS — M461 Sacroiliitis, not elsewhere classified: Secondary | ICD-10-CM | POA: Diagnosis not present

## 2022-12-07 DIAGNOSIS — M25511 Pain in right shoulder: Secondary | ICD-10-CM

## 2022-12-07 MED ORDER — DICLOFENAC SODIUM 75 MG PO TBEC: DELAYED_RELEASE_TABLET | ORAL | 0 refills | Status: DC

## 2022-12-07 NOTE — Progress Notes (Unsigned)
Office Visit Note   Patient: Sandra [name]           Date of Birth: [birth year]           MRN: [id number] Visit Date: 12/07/2022 Requested by: Jac Canavan, PA-C [address] [place],  [place] [address] PCP: Jac Canavan, PA-C  Subjective: Chief Complaint  Patient presents with   Right Shoulder - Pain    HPI: Sandra [name] is a 47 y.o. female who presents to the office reporting ***.                ROS: All systems reviewed are negative as they relate to the chief complaint within the history of present illness.  Patient denies fevers or chills.  Assessment & Plan: Visit Diagnoses:  1. Right shoulder pain, unspecified chronicity   2. Radiculopathy, cervical region     Plan: ***  Follow-Up Instructions: No follow-ups on file.   Orders:  Orders Placed This Encounter  Procedures   XR Shoulder Right   XR Cervical Spine 2 or 3 views   MR Cervical Spine w/o contrast   Meds ordered this encounter  Medications   diclofenac (VOLTAREN) 75 MG EC tablet    Sig: 1 po bid x 1 wk, then 1x daily x 1 wk then prn    Dispense:  45 tablet    Refill:  0      Procedures: No procedures performed   Clinical Data: No additional findings.  Objective: Vital Signs: There were no vitals taken for this visit.  Physical Exam:  Constitutional: Patient appears well-developed HEENT:  Head: Normocephalic Eyes:EOM are normal Neck: Normal range of motion Cardiovascular: Normal rate Pulmonary/chest: Effort normal Neurologic: Patient is alert Skin: Skin is warm Psychiatric: Patient has normal mood and affect  Ortho Exam: ***  Specialty Comments:  No specialty comments available.  Imaging: XR Cervical Spine 2 or 3 views  Result Date: 12/07/2022 AP lateral radiographs cervical spine reviewed.  Degenerative disc disease is present at C5-6.  Facet arthritis present at C7-T1.  Lordosis maintained.  XR Shoulder Right  Result Date: 12/07/2022 AP lateral outlet  radiographs right shoulder reviewed.  No acute fracture.  Acromiohumeral distance maintained.  No significant AC joint or glenohumeral joint arthritis.  Shoulder is located.  Visualized lung fields clear    PMFS History: Patient Active Problem List   Diagnosis Date Noted   Fibromyalgia 11/14/2021   History of sarcoidosis 11/14/2021   Eye pain, bilateral 08/11/2021   Screening for lipid disorders 08/11/2021   Screening for diabetes mellitus 08/11/2021   IUD (intrauterine device) in place 12/27/2020   Flat foot 12/27/2020   Paresthesia of both feet 12/27/2020   Need for Tdap vaccination 07/31/2019   Other nonspecific abnormal finding of lung field 07/31/2019   Pain of left lower extremity 12/13/2017   Sarcoidosis 10/11/2017   Headache syndrome 09/02/2017   Influenza vaccination declined 09/01/2017   Encounter for health maintenance examination in adult 09/01/2017   OAB (overactive bladder) 08/25/2017   Other fatigue 08/25/2017   Adenomyosis 01/31/2015   Dysmenorrhea 01/31/2015   Past Medical History:  Diagnosis Date   Anemia    Arthritis    knees, hips   Complication of anesthesia    nausea and vomiting , dose well with patch   COVID-19 virus infection 08/07/2021   GERD (gastroesophageal reflux disease)    Headache    IUD (intrauterine device) in place 2024   OAB (overactive bladder)    PONV (  postoperative nausea and vomiting)    "patch works well."   Sarcoidosis 2019   Dr. Coralyn Helling   Uterine fibroid     Family History  Problem Relation Age of Onset   Asthma Mother    Heart murmur Mother    Diabetes Mother    Sleep apnea Mother    Stomach cancer Maternal Grandmother    Cancer Maternal Grandmother        stomach   Cancer Maternal Grandfather        prostate   Cancer Paternal Grandmother        breast   Kidney disease Paternal Grandmother        dialysis   Sarcoidosis Neg Hx    Heart disease Neg Hx    Stroke Neg Hx    Breast cancer Neg Hx    Colon cancer  Neg Hx    Colon polyps Neg Hx    Esophageal cancer Neg Hx    Rectal cancer Neg Hx     Past Surgical History:  Procedure Laterality Date   BREAST EXCISIONAL BIOPSY Right    2015   BREAST EXCISIONAL BIOPSY Left    2007 Papilloma removed   BREAST LUMPECTOMY WITH RADIOACTIVE SEED LOCALIZATION Left 04/08/2021   Procedure: LEFT BREAST LUMPECTOMY WITH RADIOACTIVE SEED LOCALIZATION;  Surgeon: Manus Rudd, MD;  Location: [facility];  Service: General;  Laterality: Left;   BREAST SURGERY     lt breast hematoma   CESAREAN SECTION     x 2   CHOLECYSTECTOMY N/A 06/28/2017   Procedure: LAPAROSCOPIC CHOLECYSTECTOMY;  Surgeon: Abigail Miyamoto, MD;  Location: [facility];  Service: General;  Laterality: N/A;   DILATATION & CURETTAGE/HYSTEROSCOPY WITH MYOSURE N/A 03/12/2021   Procedure: DILATATION & CURETTAGE/HYSTEROSCOPY;  Surgeon: [place] Bing, MD;  Location: [facility];  Service: Gynecology;  Laterality: N/A;   ESOPHAGOGASTRODUODENOSCOPY  05/2017   Dr. Loreta Ave   HERNIA REPAIR     Umbilical Hernia   INTRAUTERINE DEVICE (IUD) INSERTION N/A 03/12/2021   Procedure: INTRAUTERINE DEVICE (IUD) INSERTION Mirena;  Surgeon: [place] Bing, MD;  Location[facility];  Service: Gynecology;  Laterality: N/A;   IUD REMOVAL N/A 03/12/2021   Procedure: INTRAUTERINE DEVICE (IUD) REMOVAL;  Surgeon: [place] Bing, MD;  Location: [facility];  Service: Gynecology;  Laterality: N/A;   VIDEO BRONCHOSCOPY WITH ENDOBRONCHIAL ULTRASOUND N/A 09/28/2017   Procedure: VIDEO BRONCHOSCOPY WITH ENDOBRONCHIAL ULTRASOUND;  Surgeon: Leslye Peer, MD;  Location: [facility] OR;  Service: Thoracic;  Laterality: N/A;   WISDOM TOOTH EXTRACTION     2   Social History   Occupational History   Not on file  Tobacco Use   Smoking status: Never   Smokeless tobacco: Never  Vaping Use   Vaping Use: Never used  Substance and Sexual Activity   Alcohol use: Not Currently     Alcohol/week: 3.0 standard drinks of alcohol    Types: 3 Shots of liquor per week    Comment: stopped in 2023   Drug use: Not Currently    Types: Marijuana    Comment: on occasion will smoke   Sexual activity: Yes    Birth control/protection: Surgical

## 2022-12-08 ENCOUNTER — Encounter: Payer: Self-pay | Admitting: Orthopedic Surgery

## 2022-12-14 ENCOUNTER — Telehealth: Payer: Self-pay | Admitting: *Deleted

## 2022-12-14 NOTE — Telephone Encounter (Signed)
Pt called and spoke with me and stated [employer] company told her the MRI was denied due to not having 6 weeks of conservative treatment- pts shoulder still hurts and it getting worse. Pt is taking NSAIDS and exercise at home- told pt if she could wait for another 2 weeks after doing therapy then I will resubmit.

## 2022-12-16 ENCOUNTER — Other Ambulatory Visit: Payer: Self-pay | Admitting: Medical

## 2022-12-17 NOTE — Telephone Encounter (Signed)
Refill request last apt 11/20/22.

## 2022-12-20 ENCOUNTER — Other Ambulatory Visit: Payer: 59

## 2022-12-23 ENCOUNTER — Ambulatory Visit: Payer: 59 | Admitting: Physical Therapy

## 2022-12-28 ENCOUNTER — Ambulatory Visit: Payer: 59 | Admitting: Orthopedic Surgery

## 2023-01-01 IMAGING — MG MM BREAST LOCALIZATION CLIP
4 series · 4 of 12 positions shown · non-contrast
Comparison: Previous exam(s).

CLINICAL DATA: Post procedure mammogram for clip placement.

EXAM:
3D DIAGNOSTIC LEFT MAMMOGRAM POST ULTRASOUND BIOPSY

[L ML synth-2D]
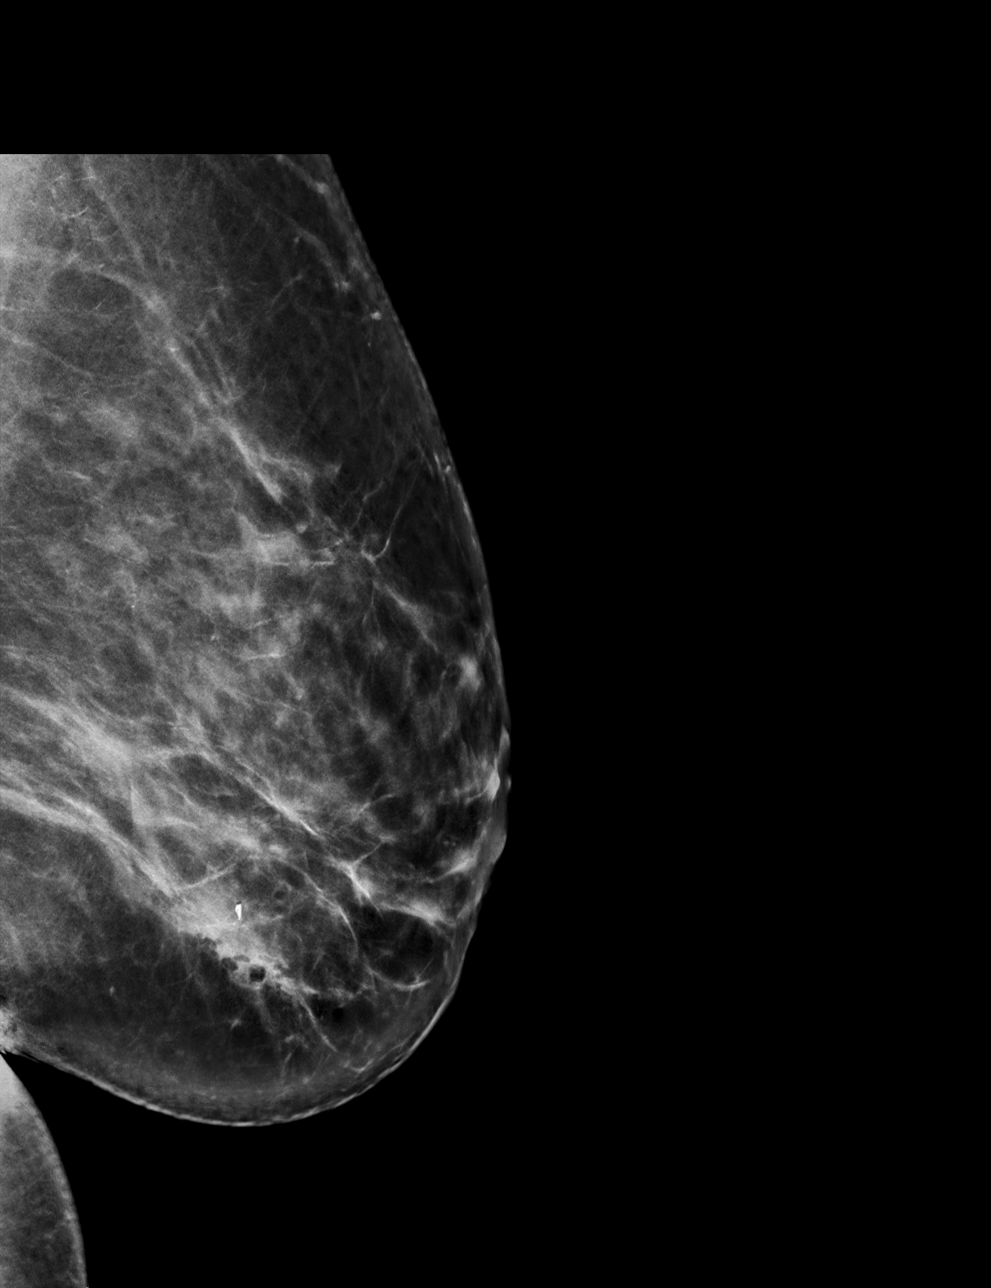

[L CC synth-2D]
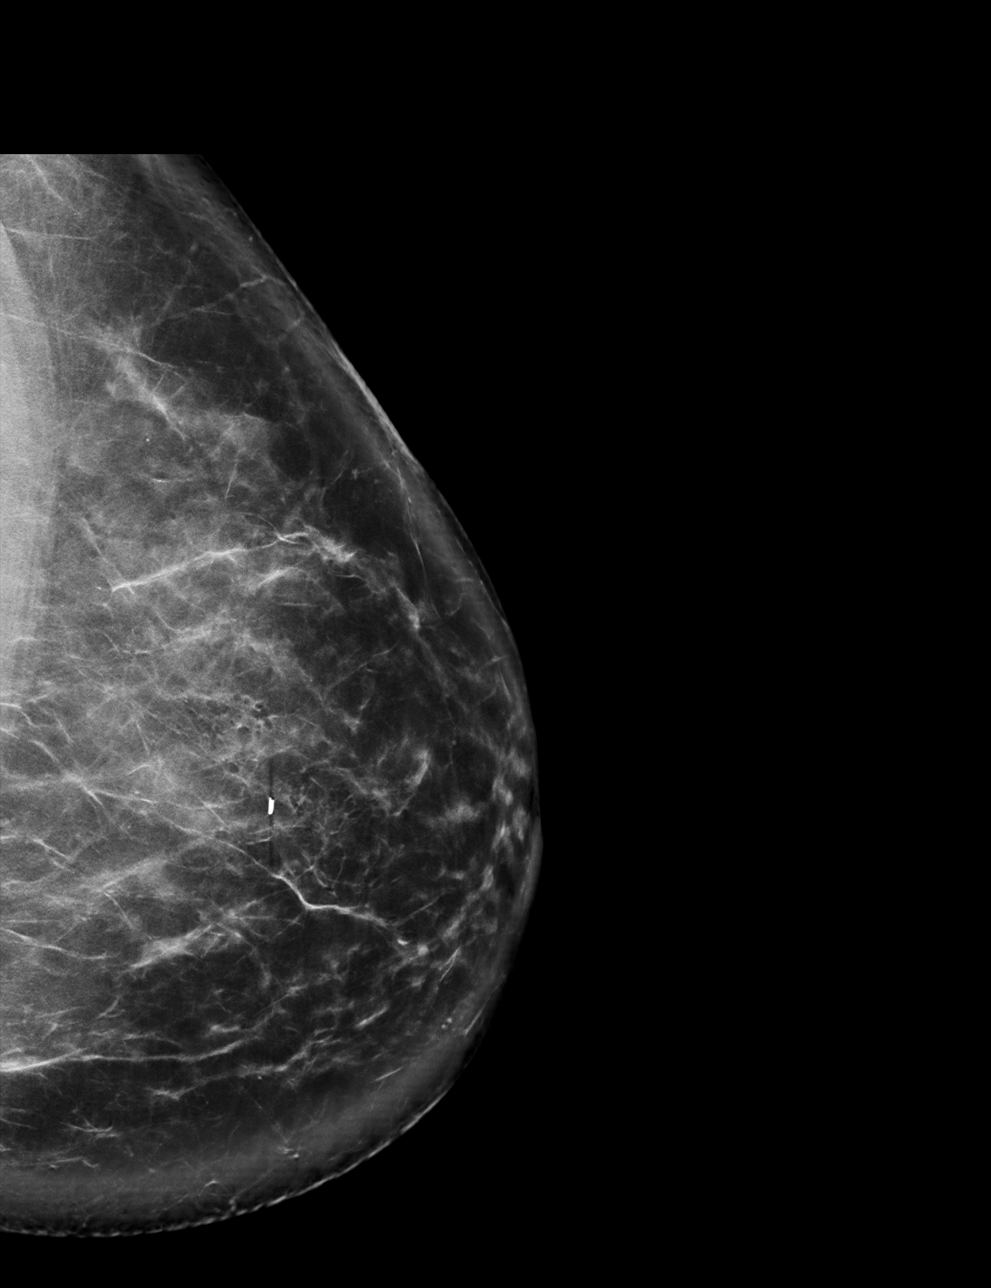

[L ML tomo · tomo slice 45/89.0]
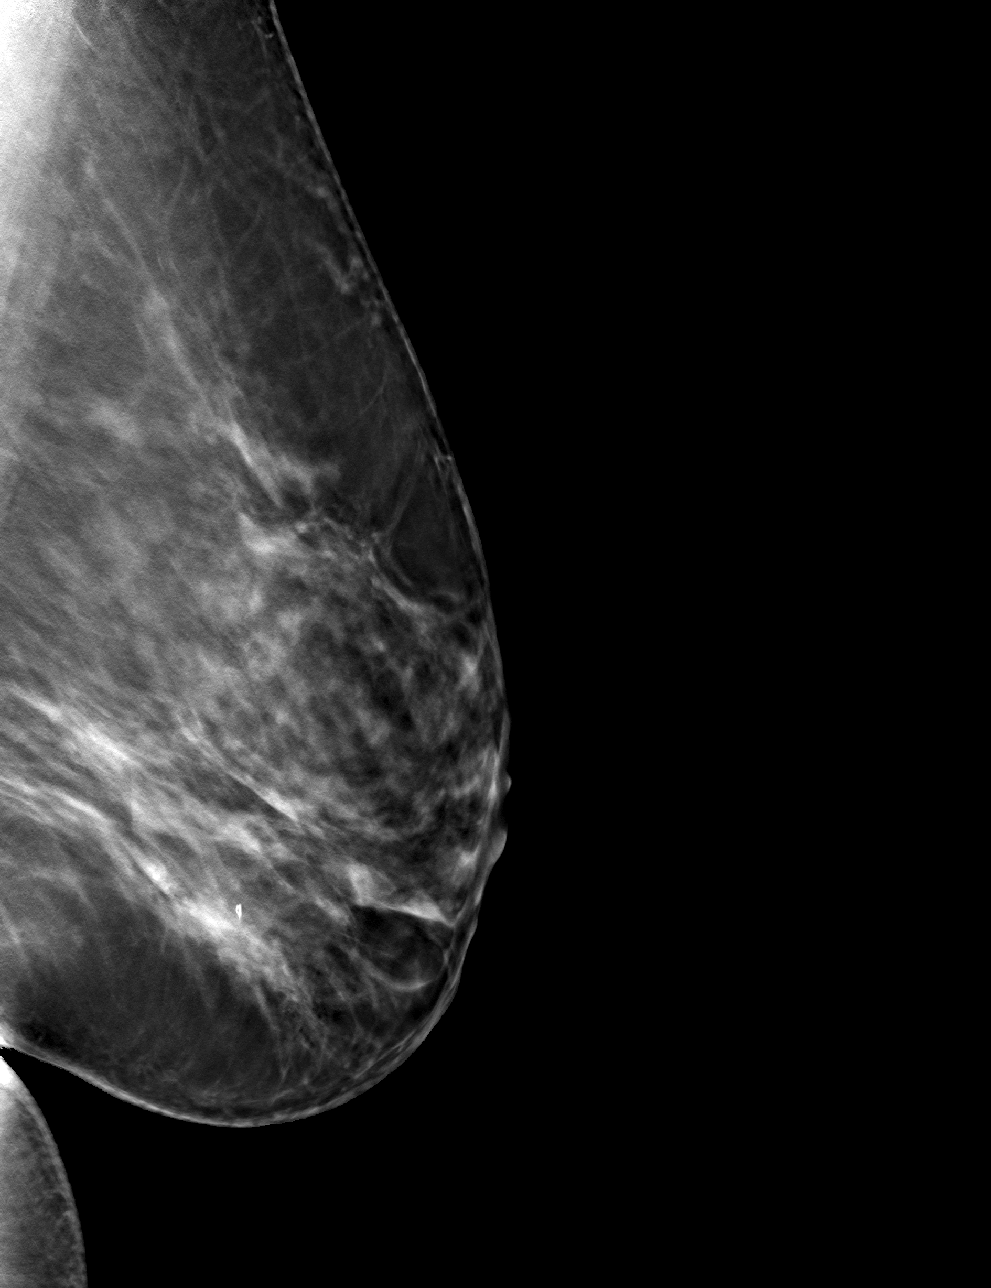

[L CC tomo · tomo slice 45/89.0]
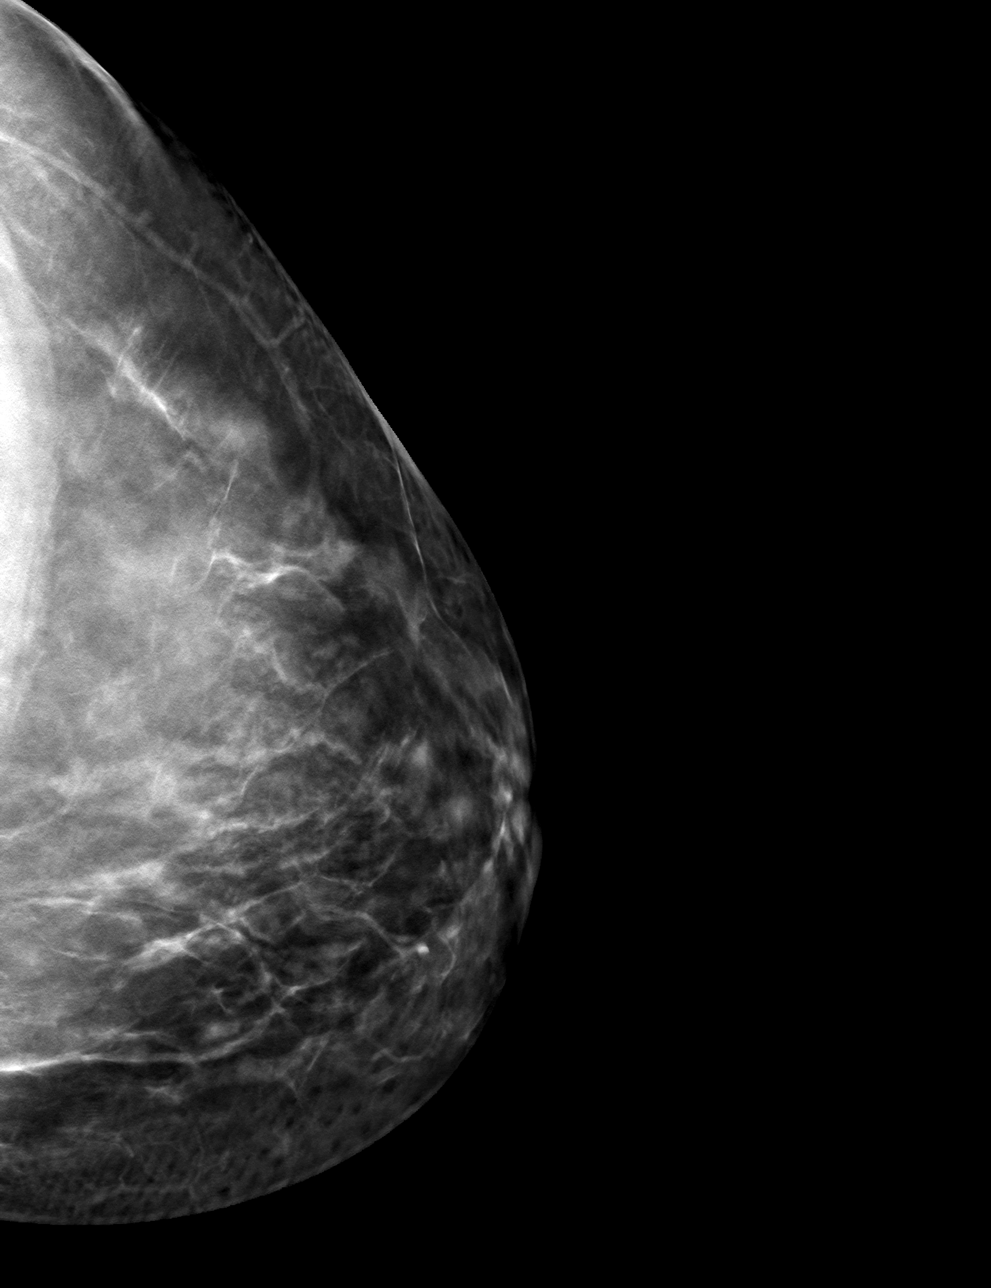

[4 of 12 positions shown; findings below may reference images not displayed]

FINDINGS: 3D Mammographic images were obtained following ultrasound guided
biopsy of a mass in the left breast at [DATE]. The biopsy marking clip
is in expected position at the site of biopsy.
IMPRESSION: Appropriate positioning of the ribbon shaped biopsy marking clip at
the site of biopsy in the left breast at 6 o'clock.

Final Assessment: Post Procedure Mammograms for Marker Placement

## 2023-01-01 IMAGING — US US BREAST BX W LOC DEV 1ST LESION IMG BX SPEC US GUIDE*L*
1 series · 9 of 9 positions shown · non-contrast
Comparison: Previous exam(s).
COMPARISON: Previous exam(s).

Addendum:
CLINICAL DATA: 44 yo female presenting for biopsy of the a left
breast mass

EXAM:
ULTRASOUND GUIDED LEFT BREAST CORE NEEDLE BIOPSY

[Series 1: us breast bx w loc dev 1st lesion img bx spec us g · 0.06mm/px · 9 of 9 slices shown]
[im 1/9]
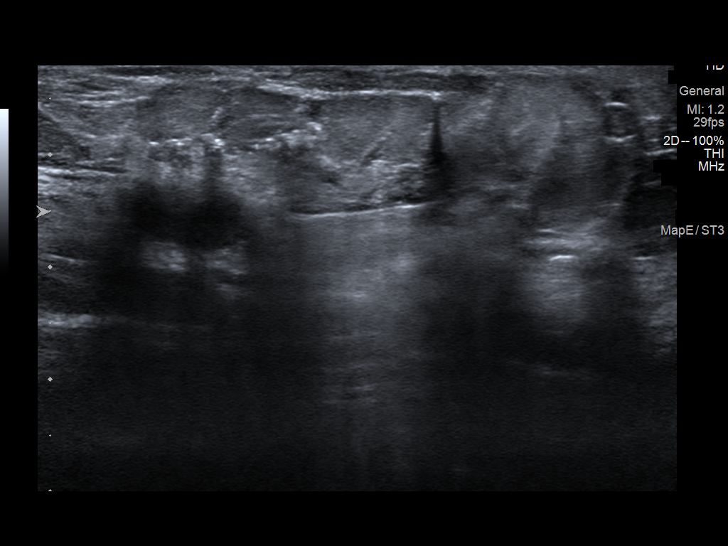
[im 2/9]
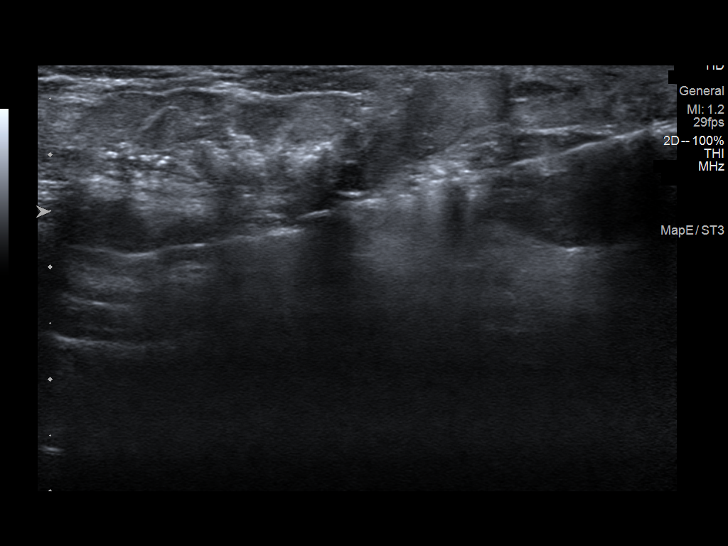
[im 3/9]
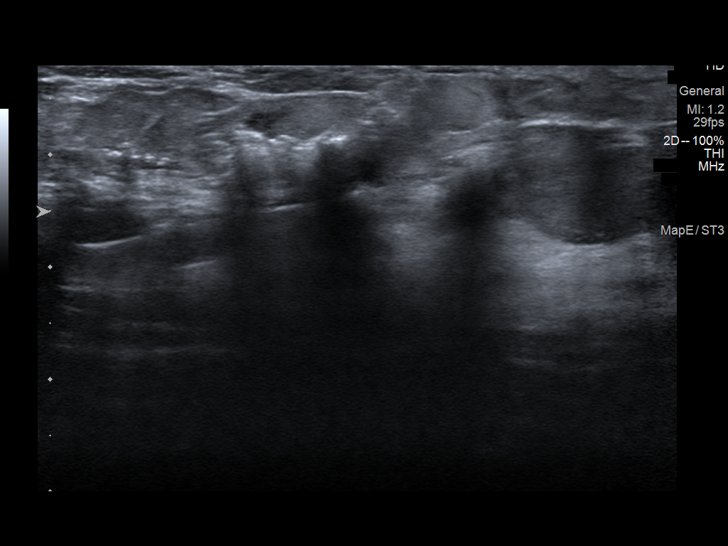
[im 4/9]
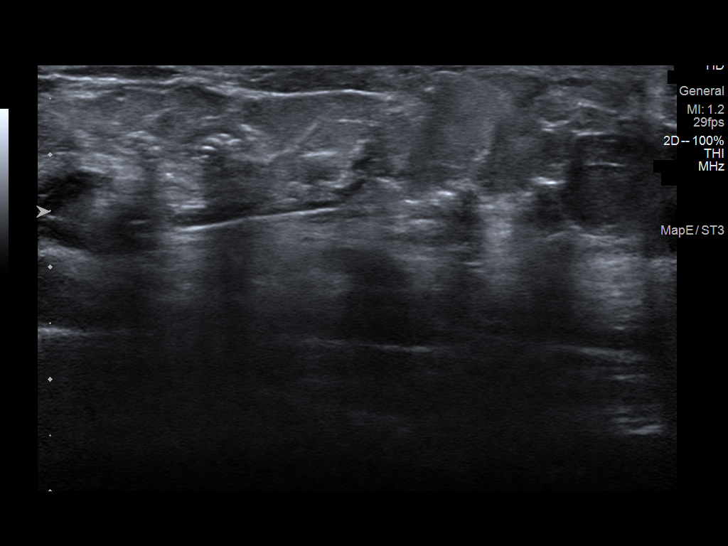
[im 5/9]
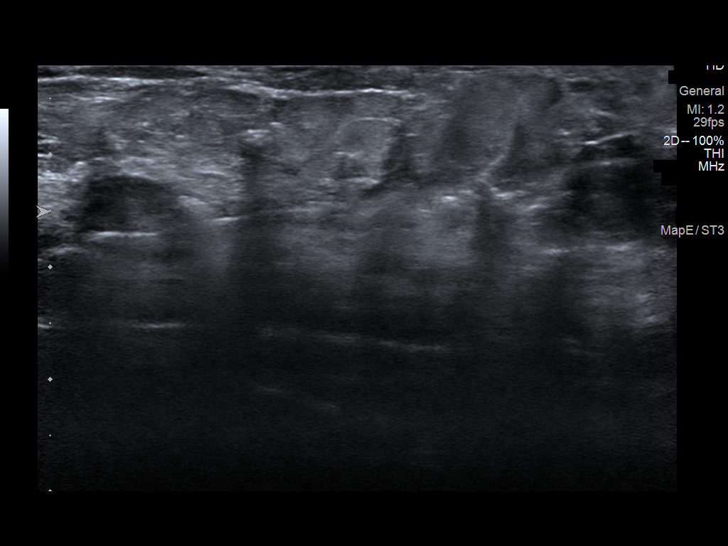
[im 6/9]
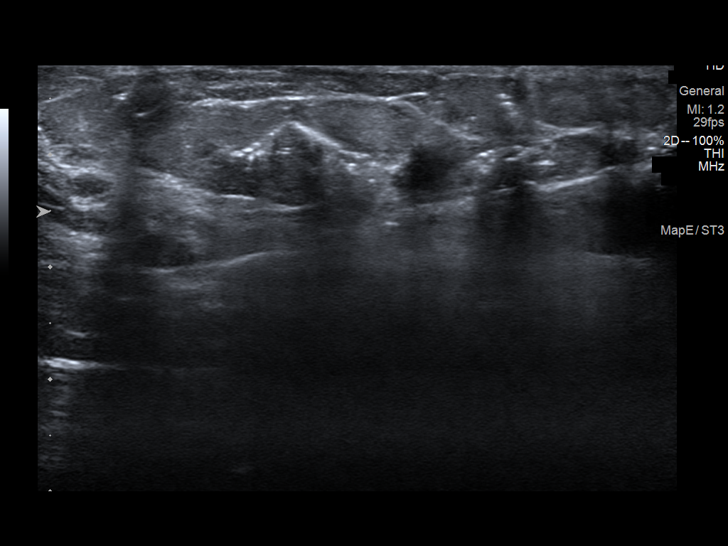
[im 7/9]
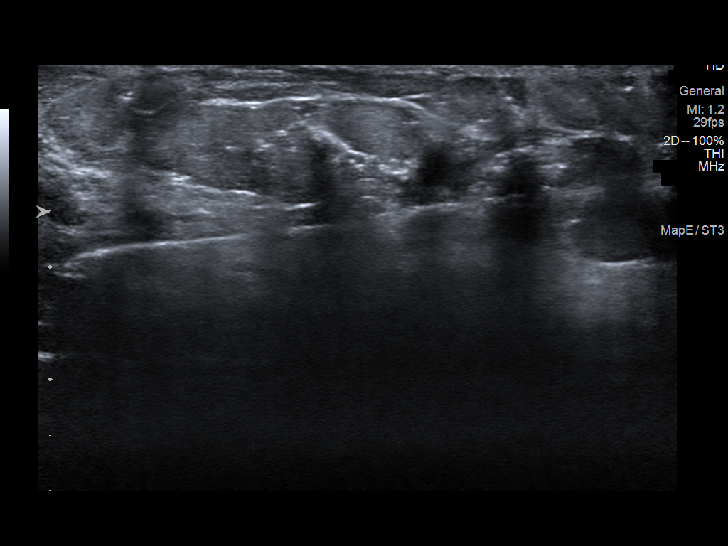
[im 8/9]
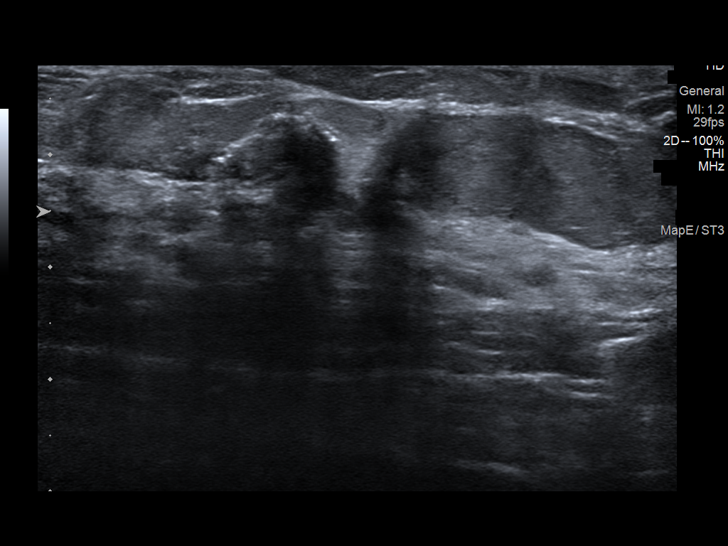
[im 9/9]
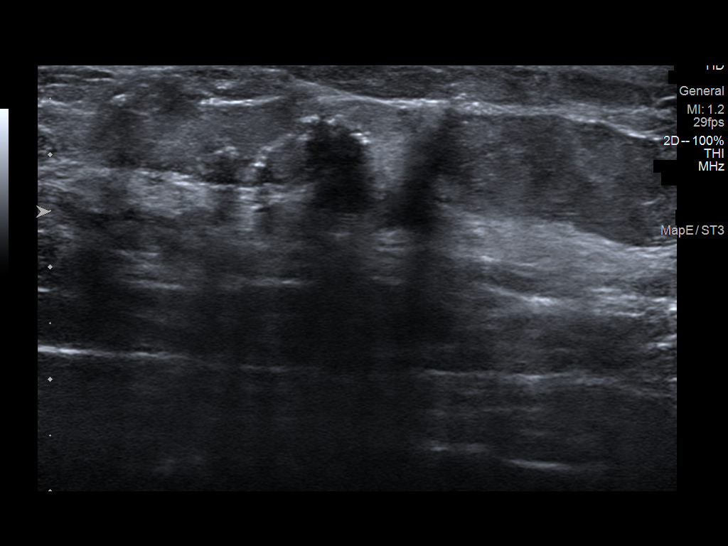

[9 of 9 positions shown; findings below may reference images not displayed]



Lesion quadrant: Lower outer quadrant

Using sterile technique and 1% Lidocaine as local anesthetic, under
direct ultrasound visualization, a 14 gauge Massagens device was
used to perform biopsy of a mass in the left breast at [DATE] using a
lateral approach. At the conclusion of the procedure a ribbon tissue
marker clip was deployed into the biopsy cavity. Follow up 2 view
mammogram was performed and dictated separately.
IMPRESSION: Ultrasound guided biopsy of a mass in the left breast at [DATE]. No
apparent complications.

ADDENDUM:
Pathology revealed FIBROCYSTIC CHANGES WITH APOCRINE METAPLASIA AND
FOCAL FEATURES SUGGESTIVE OF INTRADUCTAL

PAPILLOMA- NO ATYPIA OR MALIGNANCY IDENTIFIED of the LEFT breast, 6
o'clock, ribbon clip. This was found to be concordant by Dr. Vanderpuije
Carlsen, with surgical consultation for discussion of management
options and high-risk screening recommended, per [HOSPITAL] Breast
Working Group protocol.

Pathology results were discussed with the patient by telephone. The
patient reported doing well after the biopsy with tenderness at the
site. Post biopsy instructions and care were reviewed and questions
were answered. The patient was encouraged to call The [REDACTED]

Surgical consultation has been arranged with Dr. Paulus N Ceejay at
[REDACTED] on March 07, 2021.

Pathology results reported by Kemostene Aderito RN on February 03, 2021.



Lesion quadrant: Lower outer quadrant

Using sterile technique and 1% Lidocaine as local anesthetic, under
direct ultrasound visualization, a 14 gauge Massagens device was
used to perform biopsy of a mass in the left breast at [DATE] using a
lateral approach. At the conclusion of the procedure a ribbon tissue
marker clip was deployed into the biopsy cavity. Follow up 2 view
mammogram was performed and dictated separately.
IMPRESSION: Ultrasound guided biopsy of a mass in the left breast at [DATE]. No
apparent complications.

## 2023-01-04 ENCOUNTER — Other Ambulatory Visit: Payer: Self-pay | Admitting: Obstetrics and Gynecology

## 2023-01-04 ENCOUNTER — Other Ambulatory Visit: Payer: Self-pay | Admitting: Medical

## 2023-01-04 DIAGNOSIS — G8929 Other chronic pain: Secondary | ICD-10-CM

## 2023-01-06 ENCOUNTER — Ambulatory Visit: Payer: 59 | Attending: Obstetrics and Gynecology | Admitting: Physical Therapy

## 2023-01-06 ENCOUNTER — Other Ambulatory Visit: Payer: Self-pay

## 2023-01-06 DIAGNOSIS — R102 Pelvic and perineal pain: Secondary | ICD-10-CM | POA: Insufficient documentation

## 2023-01-06 DIAGNOSIS — R279 Unspecified lack of coordination: Secondary | ICD-10-CM | POA: Insufficient documentation

## 2023-01-06 DIAGNOSIS — R293 Abnormal posture: Secondary | ICD-10-CM | POA: Insufficient documentation

## 2023-01-06 DIAGNOSIS — G8929 Other chronic pain: Secondary | ICD-10-CM | POA: Diagnosis not present

## 2023-01-06 DIAGNOSIS — M6281 Muscle weakness (generalized): Secondary | ICD-10-CM | POA: Diagnosis not present

## 2023-01-06 NOTE — Patient Instructions (Signed)
[email or web address].com/[email or web address]   

## 2023-01-06 NOTE — Therapy (Signed)
OUTPATIENT PHYSICAL THERAPY FEMALE PELVIC EVALUATION   Patient Name: Sandra [name] MRN: [id number] DOB:1976/07/19, 47 y.o., female Today's Date: 01/06/2023  END OF SESSION:  PT End of Session - 01/06/23 1539     Visit Number 1    Date for PT Re-Evaluation 04/08/23    Authorization Type [employer]    PT Start Time 1534    PT Stop Time 1613    PT Time Calculation (min) 39 min    Activity Tolerance Patient tolerated treatment well;Patient limited by pain    Behavior During Therapy [facility] for tasks assessed/performed             Past Medical History:  Diagnosis Date   Anemia    Arthritis    knees, hips   Complication of anesthesia    nausea and vomiting , dose well with patch   COVID-19 virus infection 08/07/2021   GERD (gastroesophageal reflux disease)    Headache    IUD (intrauterine device) in place 2024   OAB (overactive bladder)    PONV (postoperative nausea and vomiting)    "patch works well."   Sarcoidosis 2019   Dr. Coralyn Helling   Uterine fibroid    Past Surgical History:  Procedure Laterality Date   BREAST EXCISIONAL BIOPSY Right    2015   BREAST EXCISIONAL BIOPSY Left    2007 Papilloma removed   BREAST LUMPECTOMY WITH RADIOACTIVE SEED LOCALIZATION Left 04/08/2021   Procedure: LEFT BREAST LUMPECTOMY WITH RADIOACTIVE SEED LOCALIZATION;  Surgeon: Manus Rudd, MD;  Location: [facility];  Service: General;  Laterality: Left;   BREAST SURGERY     lt breast hematoma   CESAREAN SECTION     x 2   CHOLECYSTECTOMY N/A 06/28/2017   Procedure: LAPAROSCOPIC CHOLECYSTECTOMY;  Surgeon: Abigail Miyamoto, MD;  [facility];  Service: General;  Laterality: N/A;   DILATATION & CURETTAGE/HYSTEROSCOPY WITH MYOSURE N/A 03/12/2021   Procedure: DILATATION & CURETTAGE/HYSTEROSCOPY;  Surgeon: Harding Bing, MD;  Location: [facility];  Service: Gynecology;  Laterality: N/A;   ESOPHAGOGASTRODUODENOSCOPY  05/2017   Dr. Loreta Ave   HERNIA REPAIR      Umbilical Hernia   INTRAUTERINE DEVICE (IUD) INSERTION N/A 03/12/2021   Procedure: INTRAUTERINE DEVICE (IUD) INSERTION Mirena;  Surgeon: Lawton Bing, MD;  Location: [facility];  Service: Gynecology;  Laterality: N/A;   IUD REMOVAL N/A 03/12/2021   Procedure: INTRAUTERINE DEVICE (IUD) REMOVAL;  Surgeon: [place] Bing, MD;  Location: [facility] [place];  Service: Gynecology;  Laterality: N/A;   VIDEO BRONCHOSCOPY WITH ENDOBRONCHIAL ULTRASOUND N/A 09/28/2017   Procedure: VIDEO BRONCHOSCOPY WITH ENDOBRONCHIAL ULTRASOUND;  Surgeon: Leslye Peer, MD;  Location: [facility];  Service: Thoracic;  Laterality: N/A;   WISDOM TOOTH EXTRACTION     2   Patient Active Problem List   Diagnosis Date Noted   Fibromyalgia 11/14/2021   History of sarcoidosis 11/14/2021   Eye pain, bilateral 08/11/2021   Screening for lipid disorders 08/11/2021   Screening for diabetes mellitus 08/11/2021   IUD (intrauterine device) in place 12/27/2020   Flat foot 12/27/2020   Paresthesia of both feet 12/27/2020   Need for Tdap vaccination 07/31/2019   Other nonspecific abnormal finding of lung field 07/31/2019   Pain of left lower extremity 12/13/2017   Sarcoidosis 10/11/2017   Headache syndrome 09/02/2017   Influenza vaccination declined 09/01/2017   Encounter for health maintenance examination in adult 09/01/2017   OAB (overactive bladder) 08/25/2017   Other fatigue 08/25/2017   Adenomyosis  01/31/2015   Dysmenorrhea 01/31/2015    PCP: Jac Canavan, PA-C  REFERRING PROVIDER: Lorriane Shire, MD  REFERRING DIAG: R10.2,G89.29 (ICD-10-CM) - Chronic female pelvic pain  THERAPY DIAG:  Muscle weakness (generalized)  Abnormal posture  Unspecified lack of coordination  Rationale for Evaluation and Treatment: Rehabilitation  ONSET DATE: 5 months  SUBJECTIVE:                                                                                                                                                                                            SUBJECTIVE STATEMENT: Has severe Rt lower abdomen/pelvic pain and does get up to 10/10 worst with having a full bladder, urge to have a bowel movement, sitting with full bladder Does have uterine fibroids on Rt side.    PAIN:  Are you having pain? Yes NPRS scale: 10/10 Pain location:  Rt lower abdomen   Pain type: intense, sharp  Pain description: constant   Aggravating factors: worst with having a full bladder, urge to have a bowel movement, sitting with full bladder Relieving factors: medication prescribed   PRECAUTIONS: None  WEIGHT BEARING RESTRICTIONS: No  FALLS:  Has patient fallen in last 6 months? No  LIVING ENVIRONMENT: Lives with: lives with their family Lives in: House/apartment   OCCUPATION: works with disabled adults  PLOF: Independent  PATIENT GOALS: to have less pain   PERTINENT HISTORY:  LEFT BREAST LUMPECTOMY WITH RADIOACTIVE SEED 2022, 2 c-sections, Umbilical Hernia, (+) HPV, uterine fibroids, OAB Sexual abuse: No  BOWEL MOVEMENT: Pain with bowel movement: No Type of bowel movement:Type (Bristol Stool Scale) 4-5, Frequency daily, and Strain No Fully empty rectum: Yes: but needs to go 3-4x Leakage: No Pads: No Fiber supplement: Yes:    URINATION: Pain with urination: No Fully empty bladder: No Stream: Strong Urgency: No Frequency: right at 2 hours - 1.5 hours; 3-4x night Leakage:  none Pads: No  INTERCOURSE: Pain with intercourse:  no pain, no dryness Ability to have vaginal penetration:  Yes:   Climax: not painful Marinoff Scale: 0/3  PREGNANCY: Vaginal deliveries 0 Tearing No C-section deliveries 2 Currently pregnant No  PROLAPSE: None   OBJECTIVE:   DIAGNOSTIC FINDINGS:     COGNITION: Overall cognitive status: Within functional limits for tasks assessed     SENSATION: Light touch: Appears intact Proprioception: Appears intact  MUSCLE LENGTH: Bil  hamstrings and adductors limited by 25%  POSTURE: rounded shoulders, forward head, and posterior pelvic tilt  PELVIC ALIGNMENT: rt anterior rotation   LUMBARAROM/PROM:  A/PROM A/PROM  eval  Flexion WFL but had tension/TTP at Rt hip/low back  Extension [facility]  Right  lateral flexion Limited by 25%  Left lateral flexion Limited by 25% with tension felt at Rt hip   Right rotation Limited by 25%  Left rotation Limited by 25%   (Blank rows = not tested)  LOWER EXTREMITY ROM: WFL  LOWER EXTREMITY MMT:  Bil hips grossly 4/5 but Rt MMTs all had pain at Rt groin knees 5/5  PALPATION:   General  TTP at Rt and mid low abdominal quadrant, Rt groin, lumbar spine, glute, hip flexor,                External Perineal Exam no TTP                             Internal Pelvic Floor TTP and tension throughout Rt superficial and deep muscle layers   Patient confirms identification and approves PT to assess internal pelvic floor and treatment Yes No emotional/communication barriers or cognitive limitation. Patient is motivated to learn. Patient understands and agrees with treatment goals and plan. PT explains patient will be examined in standing, sitting, and lying down to see how their muscles and joints work. When they are ready, they will be asked to remove their underwear so PT can examine their perineum. The patient is also given the option of providing their own chaperone as one is not provided in our facility. The patient also has the right and is explained the right to defer or refuse any part of the evaluation or treatment including the internal exam. With the patient's consent, PT will use one gloved finger to gently assess the muscles of the pelvic floor, seeing how well it contracts and relaxes and if there is muscle symmetry. After, the patient will get dressed and PT and patient will discuss exam findings and plan of care. PT and patient discuss plan of care, schedule, attendance policy and HEP  activities.  PELVIC MMT:   MMT eval  Vaginal 4/5 but with cues for technique, 5s, 7 reps  Internal Anal Sphincter   External Anal Sphincter   Puborectalis   Diastasis Recti   (Blank rows = not tested)        TONE: Inc at rt side  PROLAPSE: Not seen in hooklying   TODAY'S TREATMENT:                                                                                                                              DATE:   01/06/23 EVAL Examination completed, findings reviewed, pt educated on POC, HEP and pelvic relaxation video. Pt motivated to participate in PT and agreeable to attempt recommendations.     PATIENT EDUCATION:  Education details: [email or web address] Person educated: Patient Education method: Explanation, Demonstration, Tactile cues, Verbal cues, and Handouts Education comprehension: verbalized understanding, returned demonstration, verbal cues required, tactile cues required, and needs further education  HOME EXERCISE PROGRAM: [id number] and pelvic relaxation video   ASSESSMENT:  CLINICAL IMPRESSION:  Patient is a 47 y.o. female  who was seen today for physical therapy evaluation and treatment for pelvic pain also increased urine frequency and in complete emptying sometimes needing to strain to empty. Pt has current history of uterine fibroids and overactive bladder and previous history of  LEFT BREAST LUMPECTOMY WITH RADIOACTIVE SEED 2022, 2 c-sections, Umbilical Hernia. Pt reports pain started about 5 months ago without injury known. Pt states she feels like it is her fibroids but MD recommended attempting PFPT first to help with pain. Pt found to have decreased flexibility at spine and bil hips, decreased strength at core and bil hips though limited with pain, TTP at Rt lumbar spine, glute, groin/hip flexor, and tension in these areas as well. Patient consented to internal pelvic floor assessment vaginally this date and found to have decreased strength, endurance, and coordination and  increased tension and TTP throughout Rt pelvic floor as well. Was able to demonstrate contraction and relaxation but needed increased time to release contraction. Pt would benefit from additional PT to further address deficits.    OBJECTIVE IMPAIRMENTS: decreased coordination, decreased endurance, decreased strength, increased fascial restrictions, increased muscle spasms, impaired flexibility, impaired vision/preception, improper body mechanics, and pain.   ACTIVITY LIMITATIONS: carrying, lifting, standing, squatting, continence, and locomotion level  PARTICIPATION LIMITATIONS: community activity  PERSONAL FACTORS: Time since onset of injury/illness/exacerbation and 1 comorbidity: medical history  are also affecting patient's functional outcome.   REHAB POTENTIAL: Good  CLINICAL DECISION MAKING: Stable/uncomplicated  EVALUATION COMPLEXITY: Low   GOALS: Goals reviewed with patient? Yes  SHORT TERM GOALS: Target date: 02/03/23  Pt to be I with HEP.  Baseline: Goal status: INITIAL  2.  Pt to report improved time between bladder voids to at least 2 hours consistently for improved QOL with decreased urinary frequency.   Baseline:  Goal status: INITIAL  3.  Pt will report no more than 7/10 pain at Rt abdomen/groin area due to improvements in posture, strength, and muscle length  Baseline:  Goal status: INITIAL  4.  Pt to be I with breathing mechanics and coordination of pelvic floor and abdominal mobility for decreased pain.  Baseline:  Goal status: INITIAL  LONG TERM GOALS: Target date: 04/08/23  Pt to be I with advanced HEP.  Baseline:  Goal status: INITIAL  2.   Pt to report improved time between bladder voids to at least 3 hours during the day and no more than 2x per night for improved QOL with decreased urinary frequency.   Baseline:  Goal status: INITIAL  3.  Pt will report no more than 5/10 pain at Rt abdomen/groin area due to improvements in posture, strength, and  muscle length  Baseline:  Goal status: INITIAL  4.  Pt will report her BMs and bladder voids are complete due to improved bowel habits and evacuation techniques.  Baseline:  Goal status: INITIAL  5.  Pt to demonstrate at least 5/5 bil hip strength for improved pelvic stability and functional squats without leakage.  Baseline:  Goal status: INITIAL   PLAN:  PT FREQUENCY: every other week  PT DURATION:  8 sessions  PLANNED INTERVENTIONS: Therapeutic exercises, Therapeutic activity, Neuromuscular re-education, Patient/Family education, Self Care, Joint mobilization, DME instructions, Aquatic Therapy, Dry Needling, Electrical stimulation, Spinal mobilization, Cryotherapy, Moist heat, scar mobilization, Taping, Biofeedback, and Manual therapy  PLAN FOR NEXT SESSION: manual at abdomen/lumbar/pelvic floor/glutes, relaxation techniques, stretching pelvic floor, abdomen, and spine   Otelia Sergeant, PT, DPT 06/12/245:05 PM

## 2023-01-11 ENCOUNTER — Encounter: Payer: Self-pay | Admitting: Obstetrics and Gynecology

## 2023-01-11 ENCOUNTER — Other Ambulatory Visit: Payer: Self-pay

## 2023-01-11 ENCOUNTER — Ambulatory Visit (INDEPENDENT_AMBULATORY_CARE_PROVIDER_SITE_OTHER): Payer: 59 | Admitting: Obstetrics and Gynecology

## 2023-01-11 VITALS — BP 113/75 | HR 89 | Wt 208.6 lb

## 2023-01-11 DIAGNOSIS — N939 Abnormal uterine and vaginal bleeding, unspecified: Secondary | ICD-10-CM | POA: Diagnosis not present

## 2023-01-11 DIAGNOSIS — R102 Pelvic and perineal pain unspecified side: Secondary | ICD-10-CM

## 2023-01-11 DIAGNOSIS — G8929 Other chronic pain: Secondary | ICD-10-CM

## 2023-01-11 DIAGNOSIS — N946 Dysmenorrhea, unspecified: Secondary | ICD-10-CM

## 2023-01-11 DIAGNOSIS — D219 Benign neoplasm of connective and other soft tissue, unspecified: Secondary | ICD-10-CM

## 2023-01-11 MED ORDER — METHOCARBAMOL 500 MG PO TABS: 1000.0000 mg | ORAL_TABLET | Freq: Three times a day (TID) | ORAL | 1 refills | Status: DC | PRN

## 2023-01-11 MED ORDER — GABAPENTIN 300 MG PO CAPS: 600.0000 mg | ORAL_CAPSULE | Freq: Two times a day (BID) | ORAL | 0 refills | Status: DC

## 2023-01-11 NOTE — Progress Notes (Signed)
GYNECOLOGY VISIT  Patient name: Sandra [name] MRN [id number]  Date of birth: 01/08/1976 Chief Complaint:   Follow-up  History:  Sandra [name] is a 47 y.o. G2P2000 being seen today for pelvic pain and bleeding. Spotting since the [place]  Interested hysterectomy due to pain  Pain will wake her up out of her sleep States can't take codeine due to nausea and will cause dry heaving and  Needs scopalamine patch preop  Hernia repaired at 47 years old  Had water leaking through incision throughout pregnancy - long horizontal incision along umbilicus, never told why shey had fluid leaking  Has been taking  2 gabapentin and 2 robxin TID and 2 APAP - if all 3 pain is improved. Spotting since [place] and still taking myfembree. Had first PT session and was painful.   Past Medical History:  Diagnosis Date   Anemia    Arthritis    knees, hips   Complication of anesthesia    nausea and vomiting , dose well with patch   COVID-19 virus infection 08/07/2021   GERD (gastroesophageal reflux disease)    Headache    IUD (intrauterine device) in place 2024   OAB (overactive bladder)    PONV (postoperative nausea and vomiting)    "patch works well."   Sarcoidosis 2019   Dr. Coralyn Helling   Uterine fibroid     Past Surgical History:  Procedure Laterality Date   BREAST EXCISIONAL BIOPSY Right    2015   BREAST EXCISIONAL BIOPSY Left    2007 Papilloma removed   BREAST LUMPECTOMY WITH RADIOACTIVE SEED LOCALIZATION Left 04/08/2021   Procedure: LEFT BREAST LUMPECTOMY WITH RADIOACTIVE SEED LOCALIZATION;  Surgeon: Manus Rudd, MD;  Location: [facility];  Service: General;  Laterality: Left;   BREAST SURGERY     lt breast hematoma   CESAREAN SECTION     x 2   CHOLECYSTECTOMY N/A 06/28/2017   Procedure: LAPAROSCOPIC CHOLECYSTECTOMY;  Surgeon: Abigail Miyamoto, MD;  Location: [facility];  Service: General;  Laterality: N/A;   DILATATION & CURETTAGE/HYSTEROSCOPY WITH MYOSURE N/A 03/12/2021    Procedure: DILATATION & CURETTAGE/HYSTEROSCOPY;  Surgeon: [place] Bing, MD;  Location: [facility];  Service: Gynecology;  Laterality: N/A;   ESOPHAGOGASTRODUODENOSCOPY  05/2017   Dr. Loreta Ave   HERNIA REPAIR     Umbilical Hernia   INTRAUTERINE DEVICE (IUD) INSERTION N/A 03/12/2021   Procedure: INTRAUTERINE DEVICE (IUD) INSERTION Mirena;  Surgeon: [place] Bing, MD;  Location: [facility];  Service: Gynecology;  Laterality: N/A;   IUD REMOVAL N/A 03/12/2021   Procedure: INTRAUTERINE DEVICE (IUD) REMOVAL;  Surgeon: [place] Bing, MD;  Location: [facility];  Service: Gynecology;  Laterality: N/A;   VIDEO BRONCHOSCOPY WITH ENDOBRONCHIAL ULTRASOUND N/A 09/28/2017   Procedure: VIDEO BRONCHOSCOPY WITH ENDOBRONCHIAL ULTRASOUND;  Surgeon: Leslye Peer, MD;  Location: [facility];  Service: Thoracic;  Laterality: N/A;   WISDOM TOOTH EXTRACTION     2    The following portions of the patient's history were reviewed and updated as appropriate: allergies, current medications, past family history, past medical history, past social history, past surgical history and problem list.   Health Maintenance:   Last pap     Component Value Date/Time   DIAGPAP  01/15/2021 0904    - Negative for intraepithelial lesion or malignancy (NILM)   DIAGPAP  09/01/2017 0000    NEGATIVE FOR INTRAEPITHELIAL LESIONS OR MALIGNANCY.   DIAGPAP  09/01/2017 0000    FUNGAL ORGANISMS PRESENT  CONSISTENT WITH CANDIDA SPP.   HPVHIGH Negative 01/15/2021 0904   ADEQPAP  01/15/2021 0904    Satisfactory for evaluation; transformation zone component PRESENT.   ADEQPAP  09/01/2017 0000    Satisfactory for evaluation  endocervical/transformation zone component PRESENT.    High Risk HPV: Positive  Adequacy:  Satisfactory for evaluation, transformation zone component PRESENT  Diagnosis:  Atypical squamous cells of undetermined significance (ASC-US)  Last mammogram: n/a   Review of  Systems:  Pertinent items are noted in HPI. Comprehensive review of systems was otherwise negative.   Objective:  Physical Exam BP 113/75   Pulse 89   Wt 208 lb 9.6 oz (94.6 kg)   BMI 37.25 kg/m    Physical Exam   Labs and Imaging No results found.     Assessment & Plan:   1. Dysmenorrhea 2. Fibroids 3. Abnormal uterine bleeding (AUB) Patient desires surgical management with RA-TLH, BS, cystoscopy.  The risks of surgery were discussed in detail with the patient including but not limited to: bleeding which may require transfusion or reoperation; infection which may require prolonged hospitalization or re-hospitalization and antibiotic therapy; injury to bowel, bladder, ureters and major vessels or other surrounding organs which may lead to other procedures; formation of adhesions; need for additional procedures including laparotomy or subsequent procedures secondary to intraoperative injury or abnormal pathology; thromboembolic phenomenon; incisional problems and other postoperative or anesthesia complications.  Patient was told that the likelihood that her condition and symptoms will be treated effectively with this surgical manathe postoperative expectations were also discussed in detail. The patient also understands the alternative treatment options which were discussed in full. All questions were answered.  She was told that she will be contacted by our surgical scheduler regarding the time and date of her surgery; routine preoperative instructions will be given to her by the preoperative nursing team. Notified patient that hysterectomy will resolve menses and can stop myfembree but that she may continue to have pelvic pain given likely significant MSK component.    4. Chronic female pelvic pain Continue pain medications as she has been taking it. Patient reports prior intolerance to opiods and needing scopolamine patch for PONV.  Patient will continue PFPT as well - gabapentin  (NEURONTIN) 300 MG capsule; Take 2 capsules (600 mg total) by mouth 2 (two) times daily.  Dispense: 180 capsule; Refill: 0 - methocarbamol (ROBAXIN) 500 MG tablet; Take 2 tablets (1,000 mg total) by mouth every 8 (eight) hours as needed for muscle spasms.  Dispense: 60 tablet; Refill: 1   Lorriane Shire, MD Minimally Invasive Gynecologic Surgery [facility], [facility]

## 2023-01-15 IMAGING — US US TRANSVAGINAL NON-OB
1 series · 15 of 25 positions shown · non-contrast
Comparison: None.

CLINICAL DATA: Indeterminate intrauterine device location, pelvic
pain

EXAM:
ULTRASOUND PELVIS TRANSVAGINAL
TECHNIQUE: Transvaginal ultrasound examination of the pelvis was performed
including evaluation of the uterus, ovaries, adnexal regions, and
pelvic cul-de-sac.

[Series 1: us transvaginal non-ob · 15 of 91 slices shown]
[im 1/91]
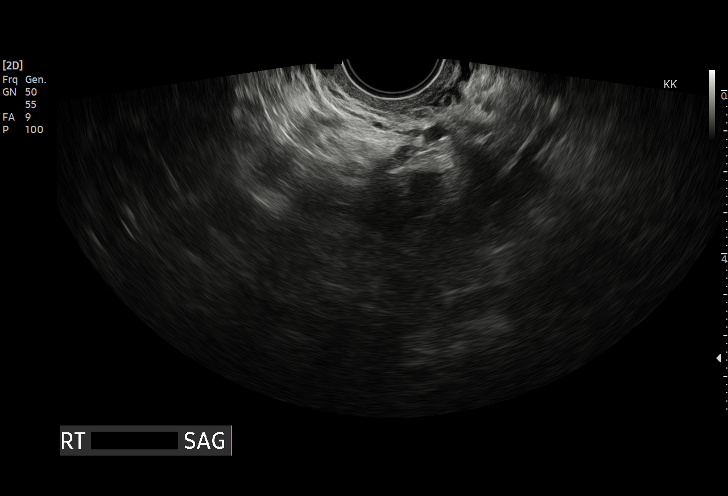
[im 8/91]
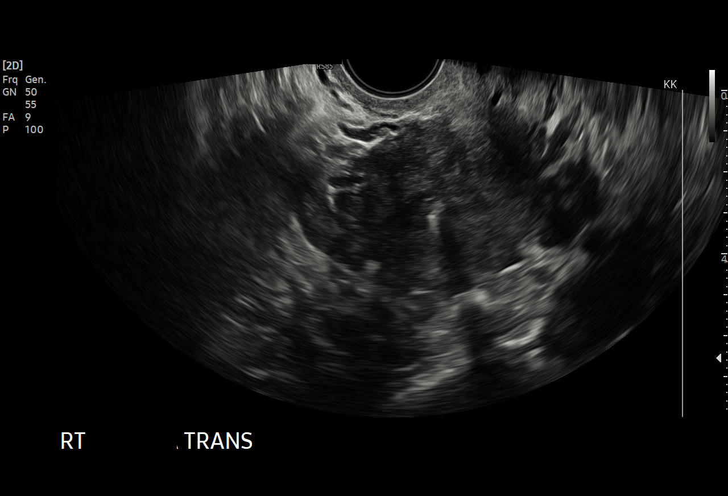
[im 16/91]
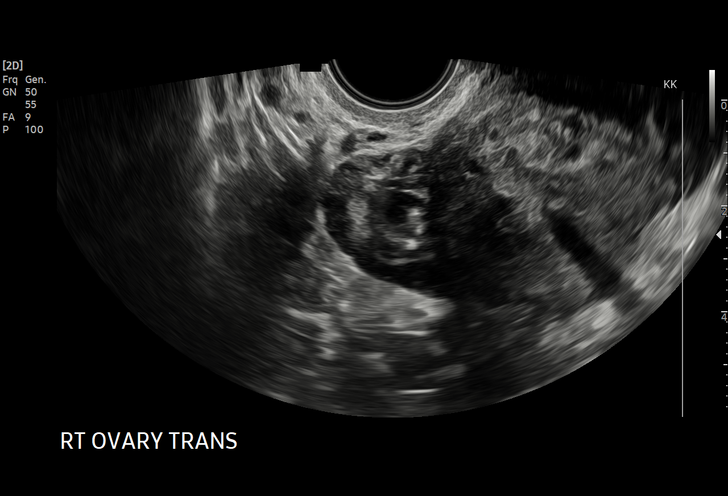
[im 19/91]
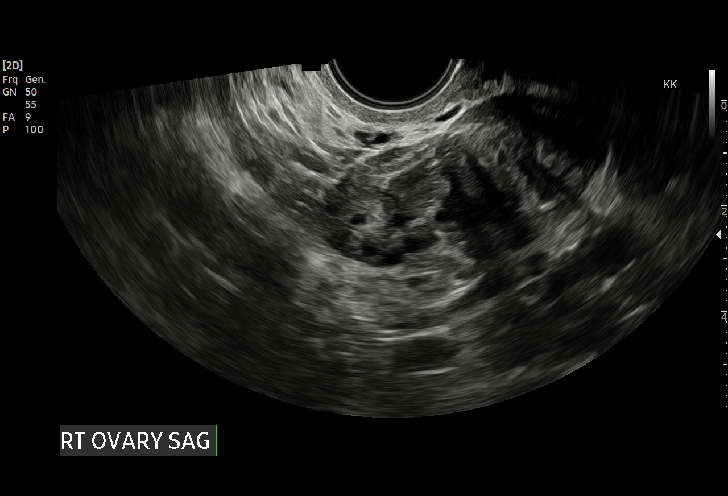
[im 27/91]
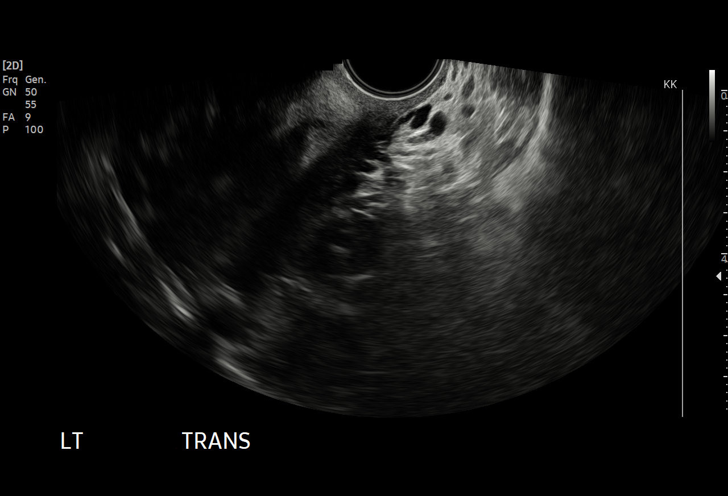
[im 34/91]
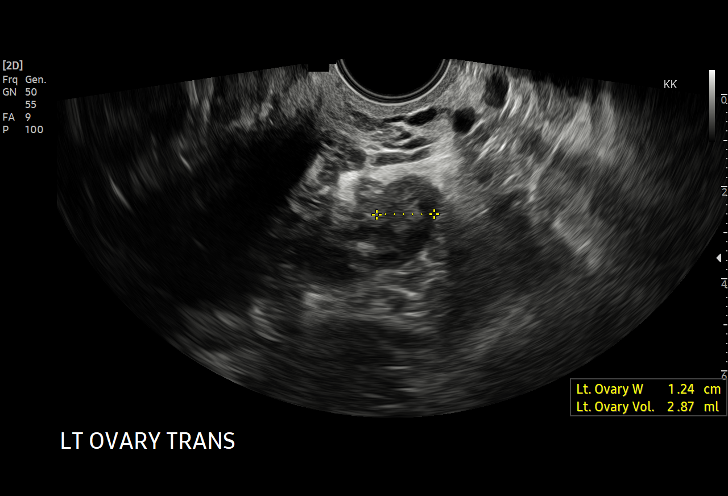
[im 38/91]
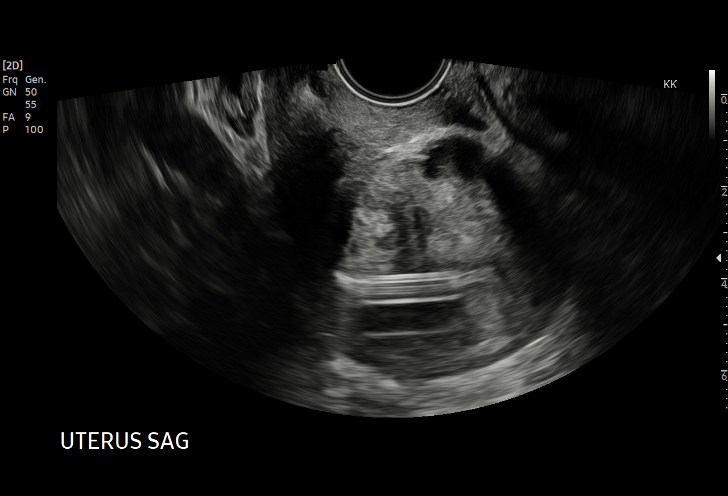
[im 46/91]
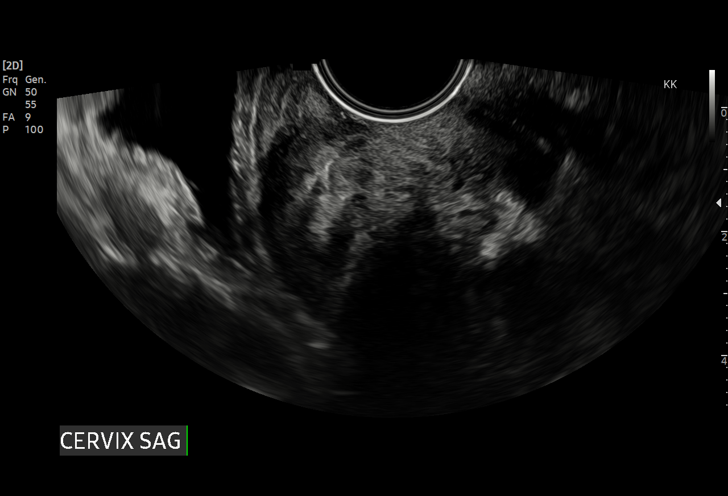
[im 53/91]
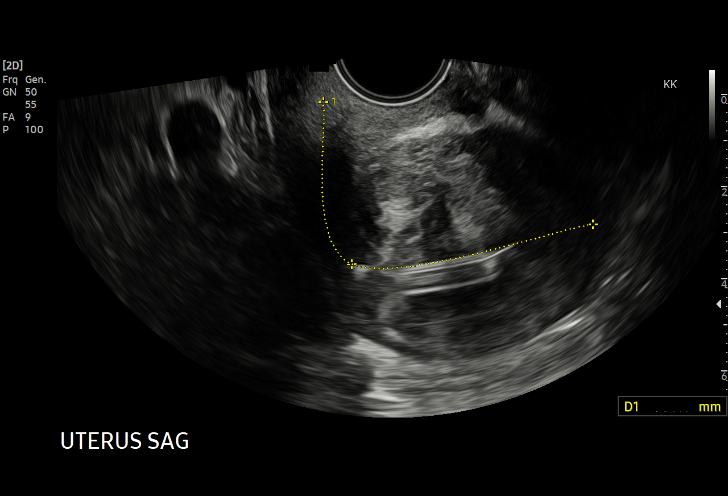
[im 57/91]
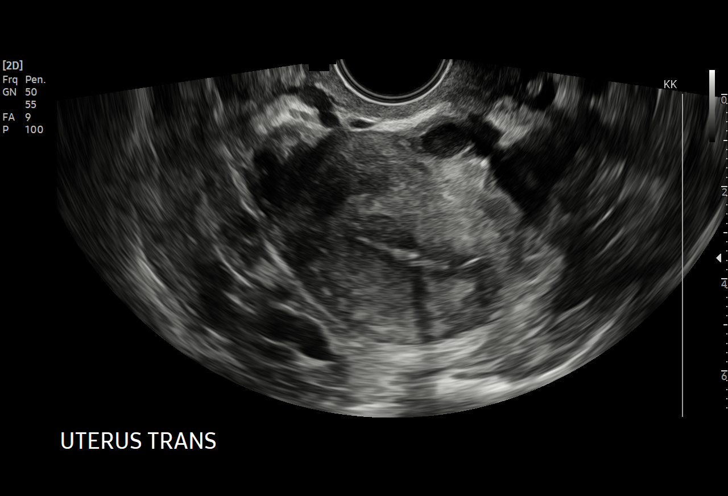
[im 64/91]
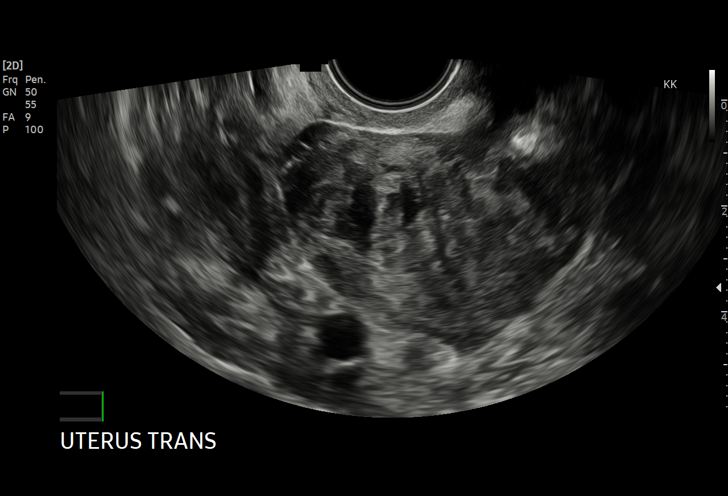
[im 72/91]
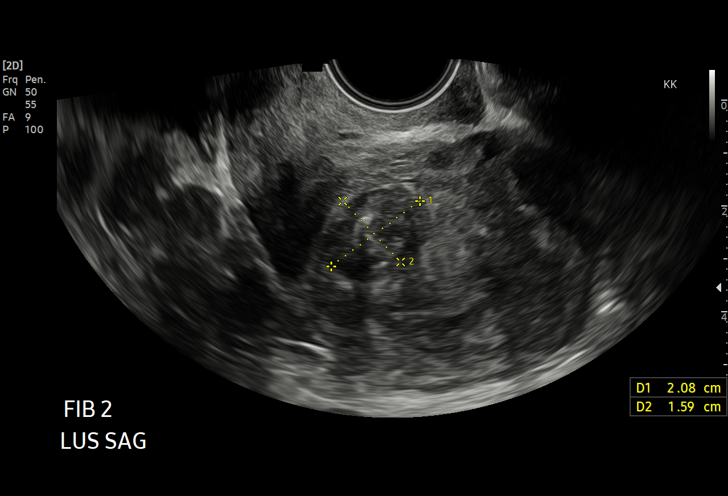
[im 76/91]
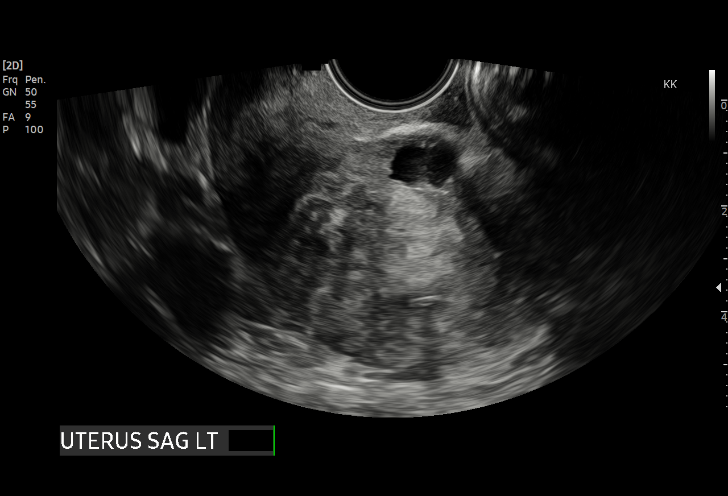
[im 83/91]
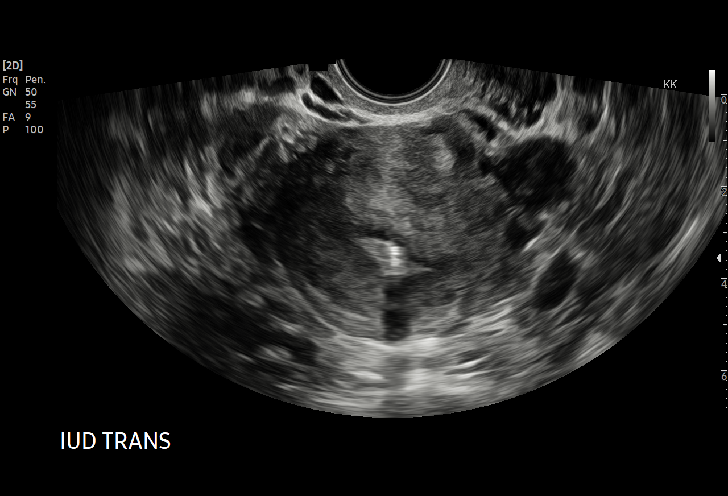
[im 91/91]
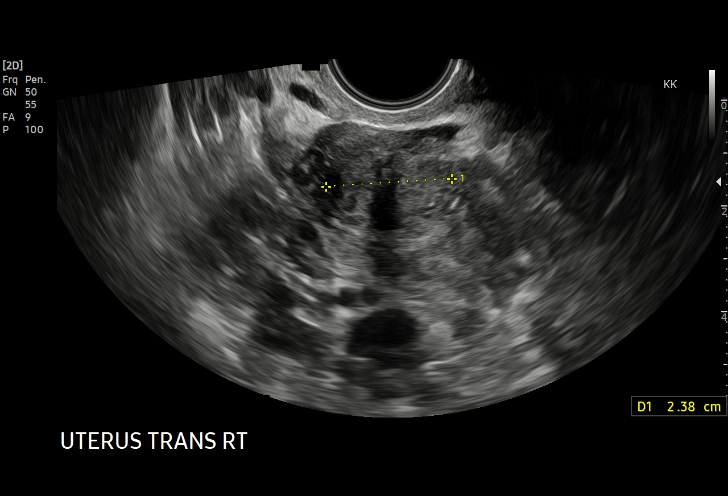

[15 of 25 positions shown; findings below may reference images not displayed]

FINDINGS: Uterus

Measurements: 7.4 x 4.2 x 4.9 cm = volume: 79 mL. The uterus is
retroverted. The myometrial echotexture is coarsened and multiple
heterogeneously hypoechoic masses are identified within the uterine
fundus in keeping with multiple uterine fibroids. At least 2
intramural lesions are identified measuring up to 3.3 x 1.4 x
cm. A a exophytic lesion arises from the fundus measuring 1.6 x
x 1.8 cm.

Endometrium

Thickness: Obscured by intrauterine device, but not overtly
thickened measuring up to 5 mm segmentally. An intrauterine device
is in expected position within the uterine cavity.

Right ovary

Measurements: 1.9 x 1.5 x 1.1 cm = volume: 2 mL. Normal
appearance/no adnexal mass.

Left ovary

Measurements: 3.0 x 1.5 x 1.2 cm = volume: 3 mL. Normal
appearance/no adnexal mass.

Other findings:  No abnormal free fluid
IMPRESSION: Retroverted uterus.

Intrauterine device in expected position within the endometrial
cavity.

Multiple intramural and exophytic uterine fibroids measuring up to
3.3 cm.

## 2023-01-18 ENCOUNTER — Ambulatory Visit: Payer: 59 | Admitting: Physical Therapy

## 2023-01-21 DIAGNOSIS — F4323 Adjustment disorder with mixed anxiety and depressed mood: Secondary | ICD-10-CM | POA: Diagnosis not present

## 2023-01-22 ENCOUNTER — Ambulatory Visit: Payer: 59 | Admitting: Orthopedic Surgery

## 2023-02-03 DIAGNOSIS — F4323 Adjustment disorder with mixed anxiety and depressed mood: Secondary | ICD-10-CM | POA: Diagnosis not present

## 2023-02-04 ENCOUNTER — Other Ambulatory Visit: Payer: Self-pay | Admitting: Medical

## 2023-02-10 DIAGNOSIS — F4323 Adjustment disorder with mixed anxiety and depressed mood: Secondary | ICD-10-CM | POA: Diagnosis not present

## 2023-02-17 ENCOUNTER — Other Ambulatory Visit: Payer: Self-pay | Admitting: Orthopedic Surgery

## 2023-02-17 DIAGNOSIS — F4323 Adjustment disorder with mixed anxiety and depressed mood: Secondary | ICD-10-CM | POA: Diagnosis not present

## 2023-02-25 DIAGNOSIS — F4323 Adjustment disorder with mixed anxiety and depressed mood: Secondary | ICD-10-CM | POA: Diagnosis not present

## 2023-03-04 DIAGNOSIS — F4323 Adjustment disorder with mixed anxiety and depressed mood: Secondary | ICD-10-CM | POA: Diagnosis not present

## 2023-03-09 ENCOUNTER — Telehealth: Payer: Self-pay

## 2023-03-09 NOTE — Telephone Encounter (Signed)
Left voicemail asking patient to call me back at [phone] to schedule surgery w/ Dr. Briscoe Deutscher.

## 2023-03-18 ENCOUNTER — Telehealth (INDEPENDENT_AMBULATORY_CARE_PROVIDER_SITE_OTHER): Payer: 59 | Admitting: Medical

## 2023-03-18 ENCOUNTER — Other Ambulatory Visit (INDEPENDENT_AMBULATORY_CARE_PROVIDER_SITE_OTHER): Payer: 59

## 2023-03-18 ENCOUNTER — Ambulatory Visit
Admission: RE | Admit: 2023-03-18 | Discharge: 2023-03-18 | Disposition: A | Discharge status: Home / Self Care | Payer: 59 | Source: Ambulatory Visit | Attending: Medical | Admitting: Medical

## 2023-03-18 ENCOUNTER — Encounter: Payer: Self-pay | Admitting: Medical

## 2023-03-18 VITALS — BP 128/88 | HR 87 | Temp 98.8°F | Wt 213.4 lb

## 2023-03-18 DIAGNOSIS — R0602 Shortness of breath: Secondary | ICD-10-CM

## 2023-03-18 DIAGNOSIS — R5383 Other fatigue: Secondary | ICD-10-CM | POA: Diagnosis not present

## 2023-03-18 DIAGNOSIS — R52 Pain, unspecified: Secondary | ICD-10-CM

## 2023-03-18 DIAGNOSIS — Z862 Personal history of diseases of the blood and blood-forming organs and certain disorders involving the immune mechanism: Secondary | ICD-10-CM

## 2023-03-18 DIAGNOSIS — R0600 Dyspnea, unspecified: Secondary | ICD-10-CM | POA: Diagnosis not present

## 2023-03-18 DIAGNOSIS — M797 Fibromyalgia: Secondary | ICD-10-CM

## 2023-03-18 LAB — POC COVID19 BINAXNOW: SARS Coronavirus 2 Ag: NEGATIVE

## 2023-03-18 LAB — POCT INFLUENZA A/B
Influenza A, POC: NEGATIVE
Influenza B, POC: NEGATIVE

## 2023-03-18 MED ORDER — PREDNISONE 10 MG PO TABS: ORAL_TABLET | ORAL | 0 refills | Status: DC

## 2023-03-18 NOTE — Addendum Note (Signed)
Addended by: Jac Canavan on: 03/18/2023 12:54 PM   Modules accepted: Orders

## 2023-03-18 NOTE — Progress Notes (Addendum)
Subjective:     Patient ID: Sandra [name], female   DOB: [birth year], 47 y.o.   MRN: [id number]  This visit type was conducted due to national recommendations for restrictions regarding the COVID-19 Pandemic (e.g. social distancing) in an effort to limit this patient's exposure and mitigate transmission in our community.  Due to their co-morbid illnesses, this patient is at least at moderate risk for complications without adequate follow up.  This format is felt to be most appropriate for this patient at this time.    Documentation for virtual audio and video telecommunications through [place] encounter:  The patient was located at home. The provider was located in the office. The patient did consent to this visit and is aware of possible charges through their insurance for this visit.  The other persons participating in this telemedicine service were none. Time spent on call was 20 minutes and in review of previous records 20 minutes total.  This virtual service is not related to other E/M service within previous 7 days.   HPI Chief Complaint  Patient presents with   Generalized Body Aches    Body aches and stiffness in body since Sunday.    Virtual consult for aches, not feeling well x 5 days.  Body aches like a bad toothache, pain in arms, legs, hips.  Feels exhausted.  Lethargy, feels like she needs rest.  Curious if its fibromyalgia or sarcoid acting up.  She notes some dyspnea, but no congestion, no runny nose, no cough, no fever.   No chills.  No NVD.  No sick contacts.  Had some chest pain last weekend, but that resolved.   Sometimes gets finger swelling no other significant swelling.  No abdominal pain.  Has been using gabapentin and muscle relaxer but this hasn't been helping for these symptoms.  Been using gabapentin 300mg  BID.  Drinks 64 oz BID of water daily.  She does feel little bit better today but still has concerns about the symptoms over the last week  No other  aggravating or relieving factors. No other complaint.  Past Medical History:  Diagnosis Date   Anemia    Arthritis    knees, hips   Complication of anesthesia    nausea and vomiting , dose well with patch   COVID-19 virus infection 08/07/2021   GERD (gastroesophageal reflux disease)    Headache    IUD (intrauterine device) in place 2024   OAB (overactive bladder)    PONV (postoperative nausea and vomiting)    "patch works well."   Sarcoidosis 2019   Dr. Coralyn Helling   Uterine fibroid    Current Outpatient Medications on File Prior to Visit  Medication Sig Dispense Refill   diclofenac (VOLTAREN) 75 MG EC tablet TAKE 1 TABLET TWICE DAILY FOR 1 WEEK, THEN TAKE 1 TABLET DAILY FOR 1 WEEK, THEN AS NEEDED 45 tablet 0   gabapentin (NEURONTIN) 300 MG capsule Take 2 capsules (600 mg total) by mouth 2 (two) times daily. 180 capsule 0   methocarbamol (ROBAXIN) 500 MG tablet Take 2 tablets (1,000 mg total) by mouth every 8 (eight) hours as needed for muscle spasms. 60 tablet 1   Multiple Vitamins-Minerals (HAIR SKIN & NAILS PO) Take 3 tablets by mouth daily.     Relugolix-Estradiol-Norethind (MYFEMBREE) 40-1-0.5 MG TABS Take 1 tablet by mouth daily at 12 noon. 30 tablet 12   Vitamin D, Ergocalciferol, (DRISDOL) 1.25 MG (50000 UNIT) CAPS capsule Take 1 capsule (50,000 Units total) by mouth every 7 (  seven) days. 12 capsule 0   levonorgestrel (MIRENA) 20 MCG/24HR IUD 1 each by Intrauterine route once.     No current facility-administered medications on file prior to visit.     Review of Systems As in subjective    Objective:   Physical Exam Due to coronavirus pandemic stay at home measures, patient visit was virtual and they were not examined in person.   BP 128/88   Pulse 87   Temp 98.8 F (37.1 C)   Wt 213 lb 6.4 oz (96.8 kg)   BMI 38.10 kg/m   Wt Readings from Last 3 Encounters:  03/18/23 213 lb 6.4 oz (96.8 kg)  01/11/23 208 lb 9.6 oz (94.6 kg)  11/20/22 216 lb (98 kg)     Gen: wd, wn, nad Answers questions appropriately.  No labored breathing or wheezing.  In person exam General appearence: alert, no distress, WD/WN, somewhat ill appearing HEENT: normocephalic, sclerae anicteric, TMs bilat with air fluid levels, but no erythema, nares patent, no discharge or erythema, pharynx normal Oral cavity: MMM, no lesions Neck: supple, no lymphadenopathy, no thyromegaly, no masses Heart: RRR, normal S1, S2, no murmurs Lungs: CTA bilaterally, no wheezes, rhonchi, or rales Abdomen: +bs, soft, non tender, non distended, no masses, no hepatomegaly, no splenomegaly Pulses: 2+ symmetric, upper and lower extremities, normal cap refill MSK: tender in general over upper arms and thighs and calves, no calve asymmetry Ext: no edema, cyanosis or clubbing Neuro: cn2-12 intact, nonfocal exam       Assessment:     Encounter Diagnoses  Name Primary?   Other fatigue Yes   SOB (shortness of breath)    History of sarcoidosis    Fibromyalgia    Body aches        Plan:     We discussed her symptoms and concerns and possible differential which is wide which can include illness, viral illness, sarcoidosis flareup, fibromyalgia, tickborne illness, electrolyte disturbance, thyroid disturbance or other.  She will come up to our office and have a initial COVID and flu test.  If negative we will bring her in the office and do some additional labs as below to further evaluate concerns  Advised rest, continue to hydrate well throughout the day   Dericka was seen today for generalized body aches.  Diagnoses and all orders for this visit:  Other fatigue -     Comprehensive metabolic panel -     CBC with Differential/Platelet -     TSH -     Sedimentation rate -     CK -     DG Chest 2 View; Future  SOB (shortness of breath) -     Comprehensive metabolic panel -     CBC with Differential/Platelet -     TSH -     Sedimentation rate -     CK -     DG Chest 2 View;  Future  History of sarcoidosis -     Comprehensive metabolic panel -     CBC with Differential/Platelet -     TSH -     Sedimentation rate -     CK -     DG Chest 2 View; Future  Fibromyalgia -     Comprehensive metabolic panel -     CBC with Differential/Platelet -     TSH -     Sedimentation rate -     CK  Body aches -     Comprehensive metabolic panel -  CBC with Differential/Platelet -     TSH -     Sedimentation rate -     CK  Other orders -     predniSONE (DELTASONE) 10 MG tablet; 6 tablets all together day 1, 5 tablets day 2, 4 tablets day 3, 3 tablets day 4, 2 tablets day 5, 1 tablet day 6.    F/u labs

## 2023-03-18 NOTE — Patient Instructions (Signed)
Please go to [facility] for your chest xray.   Their hours are 8am - 4:30 pm Monday - Friday.  Take your insurance card with you.  [facility] [phone]  [address] [place], [place] [address] 

## 2023-03-19 LAB — COMPREHENSIVE METABOLIC PANEL
ALT: 9 IU/L (ref 0–32)
AST: 15 IU/L (ref 0–40)
Albumin: 4.2 g/dL (ref 3.9–4.9)
Alkaline Phosphatase: 53 IU/L (ref 44–121)
BUN/Creatinine Ratio: 12 (ref 9–23)
BUN: 11 mg/dL (ref 6–24)
Bilirubin Total: <0.2 mg/dL (ref 0.0–1.2)
CO2: 23 mmol/L (ref 20–29)
Calcium: 8.8 mg/dL (ref 8.7–10.2)
Chloride: 103 mmol/L (ref 96–106)
Creatinine, Ser: 0.92 mg/dL (ref 0.57–1.00)
Globulin, Total: 2.4 g/dL (ref 1.5–4.5)
Glucose: 89 mg/dL (ref 70–99)
Potassium: 4.2 mmol/L (ref 3.5–5.2)
Sodium: 139 mmol/L (ref 134–144)
Total Protein: 6.6 g/dL (ref 6.0–8.5)
eGFR: 78 mL/min/{1.73_m2} (ref >59)

## 2023-03-19 LAB — TSH: TSH: 0.948 u[IU]/mL (ref 0.450–4.500)

## 2023-03-19 LAB — CBC WITH DIFFERENTIAL/PLATELET
Basophils Absolute: 0 10*3/uL (ref 0.0–0.2)
Basos: 1 %
EOS (ABSOLUTE): 0.1 10*3/uL (ref 0.0–0.4)
Eos: 4 %
Hematocrit: 37 % (ref 34.0–46.6)
Hemoglobin: 12.1 g/dL (ref 11.1–15.9)
Immature Grans (Abs): 0 10*3/uL (ref 0.0–0.1)
Immature Granulocytes: 0 %
Lymphocytes Absolute: 1.4 10*3/uL (ref 0.7–3.1)
Lymphs: 45 %
MCH: 29.4 pg (ref 26.6–33.0)
MCHC: 32.7 g/dL (ref 31.5–35.7)
MCV: 90 fL (ref 79–97)
Monocytes Absolute: 0.3 10*3/uL (ref 0.1–0.9)
Monocytes: 9 %
Neutrophils Absolute: 1.2 10*3/uL — ABNORMAL LOW (ref 1.4–7.0)
Neutrophils: 41 %
Platelets: 263 10*3/uL (ref 150–450)
RBC: 4.11 x10E6/uL (ref 3.77–5.28)
RDW: 12.5 % (ref 11.7–15.4)
WBC: 3 10*3/uL — ABNORMAL LOW (ref 3.4–10.8)

## 2023-03-19 LAB — CK: Total CK: 117 U/L (ref 32–182)

## 2023-03-19 LAB — SEDIMENTATION RATE: Sed Rate: 7 mm/h (ref 0–32)

## 2023-03-19 NOTE — Progress Notes (Signed)
Results sent through MyChart

## 2023-03-25 NOTE — Progress Notes (Signed)
Results sent through MyChart

## 2023-04-08 DIAGNOSIS — F4323 Adjustment disorder with mixed anxiety and depressed mood: Secondary | ICD-10-CM | POA: Diagnosis not present

## 2023-04-12 ENCOUNTER — Encounter (HOSPITAL_BASED_OUTPATIENT_CLINIC_OR_DEPARTMENT_OTHER): Payer: Self-pay | Admitting: Obstetrics and Gynecology

## 2023-04-12 ENCOUNTER — Other Ambulatory Visit: Payer: Self-pay

## 2023-04-12 DIAGNOSIS — Z01818 Encounter for other preprocedural examination: Secondary | ICD-10-CM | POA: Diagnosis not present

## 2023-04-12 NOTE — Progress Notes (Addendum)
Patient stated that she does snore and have daytime somnolence. She stated that she was going to have a sleep study done but her insurance would not pay for it.  .......Mar 22, 1976  patient's date of birth is  Your patient, Sandra [name] has screened at an elevated risk for obstructive sleep apnea using the STOP-Bang tool during a presurgical visit. A score of 5 or greater is an elevated risk.

## 2023-04-12 NOTE — Progress Notes (Signed)
Your procedure is scheduled on Tuesday, 04/27/2023.  Report to [facility] AT  5:30 AM.   Call this number if you have problems the morning of surgery  [phone].   OUR ADDRESS IS [address].  [facility] ARE LOCATED IN [facility].  PLEASE BRING YOUR INSURANCE CARD AND PHOTO ID DAY OF SURGERY.  ONLY 2 PEOPLE ARE ALLOWED IN  WAITING  ROOM                                      REMEMBER:  DO NOT EAT FOOD, CANDY GUM OR MINTS  AFTER MIDNIGHT THE NIGHT BEFORE YOUR SURGERY . YOU MAY HAVE CLEAR LIQUIDS FROM MIDNIGHT THE NIGHT BEFORE YOUR SURGERY UNTIL  4:30 AM. NO CLEAR LIQUIDS AFTER   4:30 AM DAY OF SURGERY.  YOU MAY  BRUSH YOUR TEETH MORNING OF SURGERY AND RINSE YOUR MOUTH OUT, NO CHEWING GUM CANDY OR MINTS.     CLEAR LIQUID DIET    Allowed      Water                                                                   Coffee and tea, regular and decaf  (NO cream or milk products of any type, may sweeten)                         Carbonated beverages, regular and diet                                    Sports drinks like Gatorade _____________________________________________________________________     TAKE ONLY THESE MEDICATIONS MORNING OF SURGERY: Gabapentin, Robaxin if needed, Albuterol inhaler if needed Please bring albuterol inhaler with you on the day of surgery.                                        DO NOT WEAR JEWERLY/  METAL/  PIERCINGS (INCLUDING NO PLASTIC PIERCINGS) DO NOT WEAR LOTIONS, POWDERS, PERFUMES OR NAIL POLISH ON YOUR FINGERNAILS. TOENAIL POLISH IS OK TO WEAR. DO NOT SHAVE FOR 48 HOURS PRIOR TO DAY OF SURGERY.  CONTACTS, GLASSES, OR DENTURES MAY NOT BE WORN TO SURGERY.  REMEMBER: NO SMOKING, VAPING ,  DRUGS OR ALCOHOL FOR 24 HOURS BEFORE YOUR SURGERY.                                    [facility] IS NOT RESPONSIBLE  FOR ANY BELONGINGS.                                                                    Marland Kitchen  [facility] - Preparing for Surgery Before surgery, you can play an important role.  Because skin is not sterile, your skin needs to be as free of germs as possible.  You can reduce the number of germs on your skin by washing with CHG (chlorahexidine gluconate) soap before surgery.  CHG is an antiseptic cleaner which kills germs and bonds with the skin to continue killing germs even after washing. Please DO NOT use if you have an allergy to CHG or antibacterial soaps.  If your skin becomes reddened/irritated stop using the CHG and inform your nurse when you arrive at Short Stay. Do not shave (including legs and underarms) for at least 48 hours prior to the first CHG shower.  You may shave your face/neck. Please follow these instructions carefully:  1.  Shower with CHG Soap the night before surgery and the  morning of Surgery.  2.  If you choose to wash your hair, wash your hair first as usual with your  normal  shampoo.  3.  After you shampoo, rinse your hair and body thoroughly to remove the  shampoo.                                        4.  Use CHG as you would any other liquid soap.  You can apply chg directly  to the skin and wash , chg soap provided, night before and morning of your surgery.  5.  Apply the CHG Soap to your body ONLY FROM THE NECK DOWN.   Do not use on face/ open                           Wound or open sores. Avoid contact with eyes, ears mouth and genitals (private parts).                       Wash face,  Genitals (private parts) with your normal soap.             6.  Wash thoroughly, paying special attention to the area where your surgery  will be performed.  7.  Thoroughly rinse your body with warm water from the neck down.  8.  DO NOT shower/wash with your normal soap after using and rinsing off  the CHG Soap.             9.  Pat yourself dry with a clean towel.            10.  Wear clean pajamas.            11.  Place clean sheets on your bed the night of your first  shower and do not  sleep with pets. Day of Surgery : Do not apply any lotions/ powders the morning of surgery.  Please wear clean clothes to the hospital/surgery center.  IF YOU HAVE ANY SKIN IRRITATION OR PROBLEMS WITH THE SURGICAL SOAP, PLEASE GET A BAR OF GOLD DIAL SOAP AND SHOWER THE NIGHT BEFORE YOUR SURGERY AND THE MORNING OF YOUR SURGERY. PLEASE LET THE NURSE KNOW MORNING OF YOUR SURGERY IF YOU HAD ANY PROBLEMS WITH THE SURGICAL SOAP.   YOUR SURGEON MAY HAVE REQUESTED EXTENDED RECOVERY TIME AFTER YOUR SURGERY. IT COULD BE A  JUST A FEW HOURS  UP TO AN OVERNIGHT STAY.  YOUR SURGEON SHOULD HAVE DISCUSSED  THIS WITH YOU PRIOR TO YOUR SURGERY. IN THE EVENT YOU NEED TO STAY OVERNIGHT PLEASE REFER TO THE FOLLOWING GUIDELINES. YOU MAY HAVE UP TO 4 VISITORS  MAY VISIT IN THE EXTENDED RECOVERY ROOM UNTIL 800 PM ONLY.  ONE  VISITOR AGE 19 AND OVER MAY SPEND THE NIGHT AND MUST BE IN EXTENDED RECOVERY ROOM NO LATER THAN 800 PM . YOUR DISCHARGE TIME AFTER YOU SPEND THE NIGHT IS 900 AM THE MORNING AFTER YOUR SURGERY. YOU MAY PACK A SMALL OVERNIGHT BAG WITH TOILETRIES FOR YOUR OVERNIGHT STAY IF YOU WISH.  REGARDLESS OF IF YOU STAY OVER NIGHT OR ARE DISCHARGED THE SAME DAY YOU WILL BE REQUIRED TO HAVE A RESPONSIBLE ADULT (18 YRS OLD OR OLDER) STAY WITH YOU FOR AT LEAST THE FIRST 24 HOURS  YOUR PRESCRIPTION MEDICATIONS WILL BE PROVIDED DURING YOUR HOSPITAL STAY.  ________________________________________________________________________                                                        QUESTIONS Mechele Claude PRE OP NURSE PHONE [phone].

## 2023-04-12 NOTE — Progress Notes (Signed)
Spoke w/ via phone for pre-op interview---[facility] needs dos---- urine pregnancy per anesthesia, surgeon orders pending        Lab results------04/21/23 lab appt for cbc, type & screen, 03/18/2023 chest xray in Epic. COVID test -----patient states asymptomatic no test needed Arrive at -------0530 on Tuesday, 04/27/2023 NPO after MN NO Solid Food.  Clear liquids from MN until---0430 Med rec completed Medications to take morning of surgery -----Gabapentin, Robaxin prn, Albuterol prn Diabetic medication -----n/a Patient instructed no nail polish to be worn day of surgery Patient instructed to bring photo id and insurance card day of surgery Patient aware to have Driver (ride ) / caregiver    for 24 hours after surgery - daughters Derrill Kay and [facility] Patient Special Instructions -----Extended / overnight stay instructions given. Bring albuterol inhaler on the day of surgery. Pre-Op special Instructions -----Requested orders form Dr. Briscoe Deutscher via Epic IB on 04/12/23. Patient verbalized understanding of instructions that were given at this phone interview. Patient denies chest pain, sob, fever, cough at the interview.

## 2023-04-13 DIAGNOSIS — F4323 Adjustment disorder with mixed anxiety and depressed mood: Secondary | ICD-10-CM | POA: Diagnosis not present

## 2023-04-15 ENCOUNTER — Other Ambulatory Visit: Payer: Self-pay | Admitting: Obstetrics and Gynecology

## 2023-04-15 DIAGNOSIS — G8929 Other chronic pain: Secondary | ICD-10-CM

## 2023-04-21 ENCOUNTER — Encounter (HOSPITAL_COMMUNITY)
Admission: RE | Admit: 2023-04-21 | Discharge: 2023-04-21 | Disposition: A | Discharge status: Home / Self Care | Payer: 59 | Source: Ambulatory Visit | Attending: Obstetrics and Gynecology | Admitting: Obstetrics and Gynecology

## 2023-04-21 DIAGNOSIS — Z01818 Encounter for other preprocedural examination: Secondary | ICD-10-CM | POA: Diagnosis not present

## 2023-04-21 LAB — CBC
HCT: 35.9 % — ABNORMAL LOW (ref 36.0–46.0)
Hemoglobin: 11.5 g/dL — ABNORMAL LOW (ref 12.0–15.0)
MCH: 29.9 pg (ref 26.0–34.0)
MCHC: 32 g/dL (ref 30.0–36.0)
MCV: 93.5 fL (ref 80.0–100.0)
Platelets: 229 10*3/uL (ref 150–400)
RBC: 3.84 MIL/uL — ABNORMAL LOW (ref 3.87–5.11)
RDW: 13.1 % (ref 11.5–15.5)
WBC: 2.9 10*3/uL — ABNORMAL LOW (ref 4.0–10.5)
nRBC: 0 % (ref 0.0–0.2)

## 2023-04-21 LAB — TYPE AND SCREEN
ABO/RH(D): A POS
Antibody Screen: NEGATIVE

## 2023-04-26 NOTE — Anesthesia Preprocedure Evaluation (Signed)
Anesthesia Evaluation  Patient identified by MRN, date of birth, ID band Patient awake    Reviewed: Allergy & Precautions, H&P , NPO status , Patient's Chart, lab work & pertinent test results  History of Anesthesia Complications (+) PONV and history of anesthetic complications  Airway Mallampati: II  TM Distance: >3 FB Neck ROM: Full    Dental no notable dental hx.    Pulmonary asthma    Pulmonary exam normal breath sounds clear to auscultation       Cardiovascular negative cardio ROS Normal cardiovascular exam Rhythm:Regular Rate:Normal     Neuro/Psych  Headaches  negative psych ROS   GI/Hepatic Neg liver ROS,GERD  ,,  Endo/Other  negative endocrine ROS    Renal/GU negative Renal ROS  negative genitourinary   Musculoskeletal  (+) Arthritis ,  Fibromyalgia -  Abdominal   Peds negative pediatric ROS (+)  Hematology  (+) Blood dyscrasia, anemia   Anesthesia Other Findings Sarcoidosis  Reproductive/Obstetrics negative OB ROS                             Anesthesia Physical Anesthesia Plan  ASA: 3  Anesthesia Plan: General   Post-op Pain Management:    Induction: Intravenous  PONV Risk Score and Plan: 3 and Ondansetron, Dexamethasone and Scopolamine patch - Pre-op  Airway Management Planned: Oral ETT  Additional Equipment:   Intra-op Plan:   Post-operative Plan: Extubation in OR  Informed Consent: I have reviewed the patients History and Physical, chart, labs and discussed the procedure including the risks, benefits and alternatives for the proposed anesthesia with the patient or authorized representative who has indicated his/her understanding and acceptance.     Dental advisory given  Plan Discussed with: CRNA  Anesthesia Plan Comments:        Anesthesia Quick Evaluation

## 2023-04-27 ENCOUNTER — Other Ambulatory Visit: Payer: Self-pay

## 2023-04-27 ENCOUNTER — Encounter (HOSPITAL_BASED_OUTPATIENT_CLINIC_OR_DEPARTMENT_OTHER)
Admission: RE | Disposition: A | Discharge status: Home / Self Care | Payer: Self-pay | Source: Ambulatory Visit | Attending: Obstetrics and Gynecology

## 2023-04-27 ENCOUNTER — Ambulatory Visit (HOSPITAL_BASED_OUTPATIENT_CLINIC_OR_DEPARTMENT_OTHER): Payer: 59

## 2023-04-27 ENCOUNTER — Ambulatory Visit (HOSPITAL_BASED_OUTPATIENT_CLINIC_OR_DEPARTMENT_OTHER): Payer: Self-pay

## 2023-04-27 ENCOUNTER — Ambulatory Visit (HOSPITAL_BASED_OUTPATIENT_CLINIC_OR_DEPARTMENT_OTHER)
Admission: RE | Admit: 2023-04-27 | Discharge: 2023-04-28 | Disposition: A | Discharge status: Home / Self Care | Payer: 59 | Source: Ambulatory Visit | Attending: Obstetrics and Gynecology | Admitting: Obstetrics and Gynecology

## 2023-04-27 ENCOUNTER — Encounter (HOSPITAL_BASED_OUTPATIENT_CLINIC_OR_DEPARTMENT_OTHER): Payer: Self-pay | Admitting: Obstetrics and Gynecology

## 2023-04-27 DIAGNOSIS — R102 Pelvic and perineal pain: Secondary | ICD-10-CM | POA: Diagnosis not present

## 2023-04-27 DIAGNOSIS — D251 Intramural leiomyoma of uterus: Secondary | ICD-10-CM | POA: Insufficient documentation

## 2023-04-27 DIAGNOSIS — N8000 Endometriosis of the uterus, unspecified: Secondary | ICD-10-CM | POA: Diagnosis not present

## 2023-04-27 DIAGNOSIS — G8929 Other chronic pain: Secondary | ICD-10-CM | POA: Insufficient documentation

## 2023-04-27 DIAGNOSIS — N946 Dysmenorrhea, unspecified: Secondary | ICD-10-CM | POA: Insufficient documentation

## 2023-04-27 DIAGNOSIS — J45909 Unspecified asthma, uncomplicated: Secondary | ICD-10-CM | POA: Insufficient documentation

## 2023-04-27 DIAGNOSIS — N8003 Adenomyosis of the uterus: Secondary | ICD-10-CM | POA: Diagnosis not present

## 2023-04-27 DIAGNOSIS — N838 Other noninflammatory disorders of ovary, fallopian tube and broad ligament: Secondary | ICD-10-CM | POA: Diagnosis not present

## 2023-04-27 DIAGNOSIS — D259 Leiomyoma of uterus, unspecified: Secondary | ICD-10-CM | POA: Diagnosis not present

## 2023-04-27 DIAGNOSIS — M797 Fibromyalgia: Secondary | ICD-10-CM | POA: Diagnosis not present

## 2023-04-27 DIAGNOSIS — N939 Abnormal uterine and vaginal bleeding, unspecified: Secondary | ICD-10-CM | POA: Diagnosis not present

## 2023-04-27 DIAGNOSIS — N809 Endometriosis, unspecified: Secondary | ICD-10-CM | POA: Diagnosis not present

## 2023-04-27 DIAGNOSIS — Z01812 Encounter for preprocedural laboratory examination: Secondary | ICD-10-CM

## 2023-04-27 DIAGNOSIS — N888 Other specified noninflammatory disorders of cervix uteri: Secondary | ICD-10-CM | POA: Diagnosis not present

## 2023-04-27 DIAGNOSIS — D869 Sarcoidosis, unspecified: Secondary | ICD-10-CM | POA: Insufficient documentation

## 2023-04-27 DIAGNOSIS — Z01818 Encounter for other preprocedural examination: Secondary | ICD-10-CM

## 2023-04-27 HISTORY — DX: Fibromyalgia: M79.7

## 2023-04-27 HISTORY — DX: Presence of spectacles and contact lenses: Z97.3

## 2023-04-27 HISTORY — PX: CYSTOSCOPY: SHX5120

## 2023-04-27 HISTORY — DX: Unspecified asthma, uncomplicated: J45.909

## 2023-04-27 HISTORY — PX: ROBOTIC ASSISTED LAPAROSCOPIC HYSTERECTOMY AND SALPINGECTOMY: SHX6379

## 2023-04-27 LAB — CBC
HCT: 34.5 % — ABNORMAL LOW (ref 36.0–46.0)
Hemoglobin: 11.4 g/dL — ABNORMAL LOW (ref 12.0–15.0)
MCH: 30.3 pg (ref 26.0–34.0)
MCHC: 33 g/dL (ref 30.0–36.0)
MCV: 91.8 fL (ref 80.0–100.0)
Platelets: 234 10*3/uL (ref 150–400)
RBC: 3.76 MIL/uL — ABNORMAL LOW (ref 3.87–5.11)
RDW: 13.3 % (ref 11.5–15.5)
WBC: 7.3 10*3/uL (ref 4.0–10.5)
nRBC: 0 % (ref 0.0–0.2)

## 2023-04-27 LAB — ABO/RH: ABO/RH(D): A POS

## 2023-04-27 LAB — POCT PREGNANCY, URINE: Preg Test, Ur: NEGATIVE

## 2023-04-27 SURGERY — XI ROBOTIC ASSISTED LAPAROSCOPIC HYSTERECTOMY AND SALPINGECTOMY
Anesthesia: General | Site: Urethra | Laterality: Left

## 2023-04-27 MED ORDER — PROPOFOL 10 MG/ML IV BOLUS
INTRAVENOUS | Status: DC | PRN
  Administered 2023-04-27: 200 mg via INTRAVENOUS

## 2023-04-27 MED ORDER — SCOPOLAMINE 1 MG/3DAYS TD PT72
1.0000 | MEDICATED_PATCH | TRANSDERMAL | Status: DC
  Administered 2023-04-27 (×2): 1.5 mg via TRANSDERMAL

## 2023-04-27 MED ORDER — SIMETHICONE 80 MG PO CHEW
CHEWABLE_TABLET | ORAL | Status: AC
  Filled 2023-04-27: qty 1

## 2023-04-27 MED ORDER — ROCURONIUM BROMIDE 10 MG/ML (PF) SYRINGE
PREFILLED_SYRINGE | INTRAVENOUS | Status: AC
  Filled 2023-04-27: qty 10

## 2023-04-27 MED ORDER — SCOPOLAMINE 1 MG/3DAYS TD PT72
MEDICATED_PATCH | TRANSDERMAL | Status: AC
  Filled 2023-04-27: qty 1

## 2023-04-27 MED ORDER — LACTATED RINGERS IV SOLN: INTRAVENOUS | Status: DC

## 2023-04-27 MED ORDER — BUPIVACAINE LIPOSOME 1.3 % IJ SUSP
INTRAMUSCULAR | Status: DC | PRN
  Administered 2023-04-27: 40 mL

## 2023-04-27 MED ORDER — GABAPENTIN 300 MG PO CAPS
600.0000 mg | ORAL_CAPSULE | Freq: Two times a day (BID) | ORAL | Status: DC
  Administered 2023-04-27 (×2): 300 mg via ORAL
  Administered 2023-04-28: 600 mg via ORAL

## 2023-04-27 MED ORDER — HYDROMORPHONE HCL 1 MG/ML IJ SOLN
INTRAMUSCULAR | Status: DC | PRN
  Administered 2023-04-27 (×2): .5 mg via INTRAVENOUS

## 2023-04-27 MED ORDER — ONDANSETRON HCL 4 MG/2ML IJ SOLN
INTRAMUSCULAR | Status: AC
  Filled 2023-04-27: qty 2

## 2023-04-27 MED ORDER — KETOROLAC TROMETHAMINE 30 MG/ML IJ SOLN
30.0000 mg | Freq: Four times a day (QID) | INTRAMUSCULAR | Status: DC
  Administered 2023-04-27 – 2023-04-28 (×3): 30 mg via INTRAVENOUS

## 2023-04-27 MED ORDER — PHENYLEPHRINE 80 MCG/ML (10ML) SYRINGE FOR IV PUSH (FOR BLOOD PRESSURE SUPPORT)
PREFILLED_SYRINGE | INTRAVENOUS | Status: AC
  Filled 2023-04-27: qty 10

## 2023-04-27 MED ORDER — POLYETHYLENE GLYCOL 3350 17 G PO PACK: 17.0000 g | PACK | Freq: Every day | ORAL | 2 refills | Status: AC

## 2023-04-27 MED ORDER — ACETAMINOPHEN 10 MG/ML IV SOLN: 1000.0000 mg | Freq: Once | INTRAVENOUS | Status: DC | PRN

## 2023-04-27 MED ORDER — IBUPROFEN 800 MG PO TABS: 800.0000 mg | ORAL_TABLET | Freq: Three times a day (TID) | ORAL | 1 refills | Status: DC | PRN

## 2023-04-27 MED ORDER — IBUPROFEN 200 MG PO TABS: 600.0000 mg | ORAL_TABLET | Freq: Four times a day (QID) | ORAL | Status: DC

## 2023-04-27 MED ORDER — SODIUM CHLORIDE 0.9 % IV SOLN
12.5000 mg | Freq: Once | INTRAVENOUS | Status: AC
  Administered 2023-04-27: 12.5 mg via INTRAVENOUS
  Filled 2023-04-27: qty 12.5

## 2023-04-27 MED ORDER — GENTAMICIN SULFATE 40 MG/ML IJ SOLN
5.0000 mg/kg | INTRAVENOUS | Status: AC
  Administered 2023-04-27: 330 mg via INTRAVENOUS
  Filled 2023-04-27: qty 8.25

## 2023-04-27 MED ORDER — PHENYLEPHRINE HCL (PRESSORS) 10 MG/ML IV SOLN
INTRAVENOUS | Status: DC | PRN
  Administered 2023-04-27 (×8): 80 ug via INTRAVENOUS

## 2023-04-27 MED ORDER — ONDANSETRON HCL 4 MG/2ML IJ SOLN
4.0000 mg | Freq: Four times a day (QID) | INTRAMUSCULAR | Status: DC | PRN
  Administered 2023-04-27: 4 mg via INTRAVENOUS

## 2023-04-27 MED ORDER — METHOCARBAMOL 500 MG PO TABS: 500.0000 mg | ORAL_TABLET | Freq: Three times a day (TID) | ORAL | 1 refills | Status: DC | PRN

## 2023-04-27 MED ORDER — DROPERIDOL 2.5 MG/ML IJ SOLN: 0.6250 mg | Freq: Once | INTRAMUSCULAR | Status: DC | PRN

## 2023-04-27 MED ORDER — WHITE PETROLATUM EX OINT
TOPICAL_OINTMENT | CUTANEOUS | Status: AC
  Filled 2023-04-27: qty 5

## 2023-04-27 MED ORDER — GABAPENTIN 300 MG PO CAPS: 600.0000 mg | ORAL_CAPSULE | Freq: Three times a day (TID) | ORAL | 0 refills | Status: DC

## 2023-04-27 MED ORDER — FENTANYL CITRATE (PF) 100 MCG/2ML IJ SOLN
INTRAMUSCULAR | Status: AC
  Filled 2023-04-27: qty 2

## 2023-04-27 MED ORDER — ACETAMINOPHEN 500 MG PO TABS
1000.0000 mg | ORAL_TABLET | ORAL | Status: AC
  Administered 2023-04-27: 1000 mg via ORAL

## 2023-04-27 MED ORDER — HYDROMORPHONE HCL 2 MG/ML IJ SOLN
INTRAMUSCULAR | Status: AC
  Filled 2023-04-27: qty 1

## 2023-04-27 MED ORDER — PHENYLEPHRINE HCL (PRESSORS) 10 MG/ML IV SOLN
INTRAVENOUS | Status: AC
  Filled 2023-04-27: qty 1

## 2023-04-27 MED ORDER — PROPOFOL 10 MG/ML IV BOLUS
INTRAVENOUS | Status: AC
  Filled 2023-04-27: qty 20

## 2023-04-27 MED ORDER — STERILE WATER FOR IRRIGATION IR SOLN
Status: DC | PRN
Start: 2023-04-27 — End: 2023-04-27
  Administered 2023-04-27: 500 mL

## 2023-04-27 MED ORDER — LACTATED RINGERS IV BOLUS
1000.0000 mL | Freq: Once | INTRAVENOUS | Status: AC
  Administered 2023-04-27: 1000 mL via INTRAVENOUS

## 2023-04-27 MED ORDER — GABAPENTIN 300 MG PO CAPS
300.0000 mg | ORAL_CAPSULE | ORAL | Status: AC
  Administered 2023-04-27: 300 mg via ORAL

## 2023-04-27 MED ORDER — KETOROLAC TROMETHAMINE 30 MG/ML IJ SOLN
INTRAMUSCULAR | Status: AC
  Filled 2023-04-27: qty 1

## 2023-04-27 MED ORDER — ONDANSETRON HCL 4 MG/2ML IJ SOLN
INTRAMUSCULAR | Status: DC | PRN
Start: 2023-04-27 — End: 2023-04-27
  Administered 2023-04-27: 4 mg via INTRAVENOUS

## 2023-04-27 MED ORDER — SIMETHICONE 80 MG PO CHEW
80.0000 mg | CHEWABLE_TABLET | Freq: Four times a day (QID) | ORAL | Status: DC | PRN
  Administered 2023-04-27 (×2): 80 mg via ORAL

## 2023-04-27 MED ORDER — KETAMINE HCL 50 MG/5ML IJ SOSY
PREFILLED_SYRINGE | INTRAMUSCULAR | Status: AC
  Filled 2023-04-27: qty 5

## 2023-04-27 MED ORDER — METHOCARBAMOL 500 MG PO TABS
ORAL_TABLET | ORAL | Status: AC
  Filled 2023-04-27: qty 1

## 2023-04-27 MED ORDER — BISACODYL 5 MG PO TBEC: 5.0000 mg | DELAYED_RELEASE_TABLET | Freq: Every day | ORAL | Status: DC | PRN

## 2023-04-27 MED ORDER — MIDAZOLAM HCL 2 MG/2ML IJ SOLN
INTRAMUSCULAR | Status: AC
  Filled 2023-04-27: qty 2

## 2023-04-27 MED ORDER — ACETAMINOPHEN 500 MG PO TABS
1000.0000 mg | ORAL_TABLET | Freq: Four times a day (QID) | ORAL | Status: DC
  Administered 2023-04-27 – 2023-04-28 (×4): 1000 mg via ORAL

## 2023-04-27 MED ORDER — MIDAZOLAM HCL 2 MG/2ML IJ SOLN
INTRAMUSCULAR | Status: DC | PRN
  Administered 2023-04-27: 2 mg via INTRAVENOUS

## 2023-04-27 MED ORDER — ROCURONIUM BROMIDE 10 MG/ML (PF) SYRINGE
PREFILLED_SYRINGE | INTRAVENOUS | Status: DC | PRN
  Administered 2023-04-27: 10 mg via INTRAVENOUS
  Administered 2023-04-27: 70 mg via INTRAVENOUS

## 2023-04-27 MED ORDER — METHOCARBAMOL 500 MG PO TABS
500.0000 mg | ORAL_TABLET | Freq: Three times a day (TID) | ORAL | Status: DC | PRN
  Administered 2023-04-27 – 2023-04-28 (×2): 500 mg via ORAL

## 2023-04-27 MED ORDER — KETOROLAC TROMETHAMINE 30 MG/ML IJ SOLN
INTRAMUSCULAR | Status: DC | PRN
Start: 2023-04-27 — End: 2023-04-27
  Administered 2023-04-27: 30 mg via INTRAVENOUS

## 2023-04-27 MED ORDER — FLUORESCEIN SODIUM 10 % IV SOLN
INTRAVENOUS | Status: DC | PRN
Start: 2023-04-27 — End: 2023-04-27
  Administered 2023-04-27: 20 mg via INTRAVENOUS

## 2023-04-27 MED ORDER — CLINDAMYCIN PHOSPHATE 900 MG/50ML IV SOLN
INTRAVENOUS | Status: AC
  Filled 2023-04-27: qty 50

## 2023-04-27 MED ORDER — DEXAMETHASONE SODIUM PHOSPHATE 10 MG/ML IJ SOLN
INTRAMUSCULAR | Status: DC | PRN
  Administered 2023-04-27: 5 mg via INTRAVENOUS

## 2023-04-27 MED ORDER — LIDOCAINE 2% (20 MG/ML) 5 ML SYRINGE
INTRAMUSCULAR | Status: DC | PRN
  Administered 2023-04-27: 60 mg via INTRAVENOUS

## 2023-04-27 MED ORDER — ONDANSETRON HCL 4 MG PO TABS: 4.0000 mg | ORAL_TABLET | Freq: Four times a day (QID) | ORAL | Status: DC | PRN

## 2023-04-27 MED ORDER — ACETAMINOPHEN 500 MG PO TABS: 500.0000 mg | ORAL_TABLET | Freq: Four times a day (QID) | ORAL | 0 refills | Status: DC | PRN

## 2023-04-27 MED ORDER — GABAPENTIN 300 MG PO CAPS
ORAL_CAPSULE | ORAL | Status: AC
  Filled 2023-04-27: qty 2

## 2023-04-27 MED ORDER — DEXAMETHASONE SODIUM PHOSPHATE 10 MG/ML IJ SOLN
INTRAMUSCULAR | Status: AC
  Filled 2023-04-27: qty 1

## 2023-04-27 MED ORDER — POLYETHYLENE GLYCOL 3350 17 G PO PACK: 17.0000 g | PACK | Freq: Every day | ORAL | Status: DC | PRN

## 2023-04-27 MED ORDER — KETAMINE HCL 10 MG/ML IJ SOLN
INTRAMUSCULAR | Status: DC | PRN
Start: 2023-04-27 — End: 2023-04-27
  Administered 2023-04-27: 30 mg via INTRAVENOUS

## 2023-04-27 MED ORDER — CLINDAMYCIN PHOSPHATE 900 MG/50ML IV SOLN
900.0000 mg | INTRAVENOUS | Status: AC
  Administered 2023-04-27: 900 mg via INTRAVENOUS

## 2023-04-27 MED ORDER — GABAPENTIN 300 MG PO CAPS
ORAL_CAPSULE | ORAL | Status: AC
  Filled 2023-04-27: qty 1

## 2023-04-27 MED ORDER — ACETAMINOPHEN 500 MG PO TABS
ORAL_TABLET | ORAL | Status: AC
  Filled 2023-04-27: qty 2

## 2023-04-27 MED ORDER — SUGAMMADEX SODIUM 200 MG/2ML IV SOLN
INTRAVENOUS | Status: DC | PRN
  Administered 2023-04-27: 200 mg via INTRAVENOUS

## 2023-04-27 MED ORDER — POVIDONE-IODINE 10 % EX SWAB: 2.0000 | Freq: Once | CUTANEOUS | Status: DC

## 2023-04-27 MED ORDER — FENTANYL CITRATE (PF) 100 MCG/2ML IJ SOLN
25.0000 ug | INTRAMUSCULAR | Status: DC | PRN
  Administered 2023-04-27 (×2): 25 ug via INTRAVENOUS

## 2023-04-27 MED ORDER — FENTANYL CITRATE (PF) 100 MCG/2ML IJ SOLN
INTRAMUSCULAR | Status: DC | PRN
  Administered 2023-04-27 (×2): 50 ug via INTRAVENOUS

## 2023-04-27 MED ORDER — HEMOSTATIC AGENTS (NO CHARGE) OPTIME
TOPICAL | Status: DC | PRN
Start: 2023-04-27 — End: 2023-04-27
  Administered 2023-04-27: 1 via TOPICAL

## 2023-04-27 MED ORDER — PHENYLEPHRINE HCL-NACL 20-0.9 MG/250ML-% IV SOLN
INTRAVENOUS | Status: DC | PRN
Start: 2023-04-27 — End: 2023-04-27
  Administered 2023-04-27: 30 ug/min via INTRAVENOUS

## 2023-04-27 MED ORDER — LIDOCAINE HCL (PF) 2 % IJ SOLN
INTRAMUSCULAR | Status: AC
  Filled 2023-04-27: qty 5

## 2023-04-27 MED ORDER — SODIUM CHLORIDE 0.9 % IR SOLN
Status: DC | PRN
  Administered 2023-04-27 (×2): 1000 mL

## 2023-04-27 MED ORDER — OXYCODONE HCL 5 MG PO TABS: 5.0000 mg | ORAL_TABLET | Freq: Once | ORAL | Status: DC | PRN

## 2023-04-27 MED ORDER — OXYCODONE HCL 5 MG/5ML PO SOLN: 5.0000 mg | Freq: Once | ORAL | Status: DC | PRN

## 2023-04-27 SURGICAL SUPPLY — 62 items
ADH SKN CLS APL DERMABOND .7 (GAUZE/BANDAGES/DRESSINGS) ×2
APL SRG 38 LTWT LNG FL B (MISCELLANEOUS) ×2
APPLICATOR ARISTA FLEXITIP XL (MISCELLANEOUS) IMPLANT
BAG DRN RND TRDRP ANRFLXCHMBR (UROLOGICAL SUPPLIES) ×2
BAG URINE DRAIN 2000ML AR STRL (UROLOGICAL SUPPLIES) ×3 IMPLANT
CATH FOLEY 3WAY 5CC 16FR (CATHETERS) ×3 IMPLANT
COVER BACK TABLE 60X90IN (DRAPES) ×3 IMPLANT
COVER TIP SHEARS 8 DVNC (MISCELLANEOUS) ×3 IMPLANT
DEFOGGER SCOPE WARMER CLEARIFY (MISCELLANEOUS) ×3 IMPLANT
DERMABOND ADVANCED .7 DNX12 (GAUZE/BANDAGES/DRESSINGS) ×3 IMPLANT
DRAPE ARM DVNC X/XI (DISPOSABLE) ×12 IMPLANT
DRAPE COLUMN DVNC XI (DISPOSABLE) ×3 IMPLANT
DRAPE SURG IRRIG POUCH 19X23 (DRAPES) ×3 IMPLANT
DRAPE UTILITY XL STRL (DRAPES) ×3 IMPLANT
DRIVER NDL MEGA SUTCUT DVNCXI (INSTRUMENTS) ×3 IMPLANT
DRIVER NDLE MEGA SUTCUT DVNCXI (INSTRUMENTS) ×2
DURAPREP 26ML APPLICATOR (WOUND CARE) ×3 IMPLANT
ELECT REM PT RETURN 9FT ADLT (ELECTROSURGICAL) ×2
ELECTRODE REM PT RTRN 9FT ADLT (ELECTROSURGICAL) ×3 IMPLANT
FORCEPS PROGRASP DVNC XI (FORCEP) ×3 IMPLANT
GAUZE 4X4 16PLY ~~LOC~~+RFID DBL (SPONGE) IMPLANT
GLOVE BIO SURGEON STRL SZ7 (GLOVE) ×9 IMPLANT
GLOVE BIOGEL PI IND STRL 6 (GLOVE) IMPLANT
GLOVE BIOGEL PI IND STRL 7.0 (GLOVE) ×9 IMPLANT
GLOVE BIOGEL PI IND STRL 7.5 (GLOVE) IMPLANT
GLOVE SURG SS PI 7.0 STRL IVOR (GLOVE) IMPLANT
GOWN STRL REUS W/ TWL XL LVL3 (GOWN DISPOSABLE) ×9 IMPLANT
GOWN STRL REUS W/TWL XL LVL3 (GOWN DISPOSABLE) ×6 IMPLANT
HEMOSTAT ARISTA ABSORB 3G PWDR (HEMOSTASIS) IMPLANT
HIBICLENS CHG 4% 4OZ (MISCELLANEOUS) IMPLANT
IRRIG SUCT STRYKERFLOW 2 WTIP (MISCELLANEOUS) ×2
IRRIGATION SUCT STRKRFLW 2 WTP (MISCELLANEOUS) ×3 IMPLANT
IV NS 1000ML (IV SOLUTION) ×4
IV NS 1000ML BAXH (IV SOLUTION) IMPLANT
KIT PINK PAD W/HEAD ARE REST (MISCELLANEOUS) ×2
KIT PINK PAD W/HEAD ARM REST (MISCELLANEOUS) ×3 IMPLANT
KIT TURNOVER CYSTO (KITS) ×3 IMPLANT
LEGGING LITHOTOMY PAIR STRL (DRAPES) ×3 IMPLANT
NDL INSUFFLATION 14GA 120MM (NEEDLE) IMPLANT
NDL SPNL 22GX3.5 QUINCKE BK (NEEDLE) IMPLANT
NEEDLE INSUFFLATION 14GA 120MM (NEEDLE) ×2
NEEDLE SPNL 22GX3.5 QUINCKE BK (NEEDLE) ×2
OBTURATOR OPTICAL STND 8 DVNC (TROCAR) ×2
OBTURATOR OPTICALSTD 8 DVNC (TROCAR) ×3 IMPLANT
PACK ROBOT WH (CUSTOM PROCEDURE TRAY) ×3 IMPLANT
PAD OB MATERNITY 4.3X12.25 (PERSONAL CARE ITEMS) ×3 IMPLANT
PAD PREP 24X48 CUFFED NSTRL (MISCELLANEOUS) ×3 IMPLANT
PROTECTOR NERVE ULNAR (MISCELLANEOUS) ×3 IMPLANT
RUMI II 3.0CM BLUE KOH-EFFICIE (DISPOSABLE) IMPLANT
SCISSORS MNPLR CVD DVNC XI (INSTRUMENTS) ×3 IMPLANT
SEAL UNIV 5-12 XI (MISCELLANEOUS) ×12 IMPLANT
SEALER VESSEL EXT DVNC XI (MISCELLANEOUS) IMPLANT
SET IRRIG Y TYPE TUR BLADDER L (SET/KITS/TRAYS/PACK) ×3 IMPLANT
SET TRI-LUMEN FLTR TB AIRSEAL (TUBING) ×3 IMPLANT
SLEEVE SCD COMPRESS KNEE MED (STOCKING) ×3 IMPLANT
SPIKE FLUID TRANSFER (MISCELLANEOUS) ×3 IMPLANT
SUT VIC AB 4-0 PS2 18 (SUTURE) ×6 IMPLANT
SUT VLOC 180 0 9IN GS21 (SUTURE) ×3 IMPLANT
TIP UTERINE 6.7X8CM BLUE DISP (MISCELLANEOUS) IMPLANT
TOWEL OR 17X24 6PK STRL BLUE (TOWEL DISPOSABLE) ×3 IMPLANT
TROCAR PORT AIRSEAL 8X120 (TROCAR) ×3 IMPLANT
WATER STERILE IRR 500ML POUR (IV SOLUTION) IMPLANT

## 2023-04-27 NOTE — Brief Op Note (Signed)
04/27/2023  10:02 AM  PATIENT:  Sandra [name]  47 y.o. female  PRE-OPERATIVE DIAGNOSIS:  dysmenorrhea pelvic pain abnormal uterine bleeding Fibroids  POST-OPERATIVE DIAGNOSIS:  dysmenorrheapelvic painabnormal uterine bleedingFibroids  PROCEDURE:  Procedure(s): XI ROBOTIC ASSISTED LAPAROSCOPIC TOTAL HYSTERECTOMY AND SALPINGECTOMY (Bilateral) CYSTOSCOPY (Left)  SURGEON:  Surgeons and Role:    Lorriane Shire, MD - Primary    * Milas Hock, MD - Assisting  PHYSICIAN ASSISTANT: n/a  ASSISTANTS: Dr. Para March   ANESTHESIA:   general and TAP block  EBL:  15 ml   BLOOD ADMINISTERED:none  DRAINS: none   LOCAL MEDICATIONS USED:  BUPIVICAINE  and OTHER exparel  SPECIMEN:  Source of Specimen:  uterus, cervix, bilateral salpingectomy  DISPOSITION OF SPECIMEN:  PATHOLOGY  COUNTS:  YES  TOURNIQUET:  * No tourniquets in log *  DICTATION: .Note written in EPIC  PLAN OF CARE:  extended recovery  PATIENT DISPOSITION:  PACU - hemodynamically stable.   Delay start of Pharmacological VTE agent (>24hrs) due to surgical blood loss or risk of bleeding: not applicable

## 2023-04-27 NOTE — Transfer of Care (Signed)
Immediate Anesthesia Transfer of Care Note  Patient: Sandra [name]  Procedure(s) Performed: XI ROBOTIC ASSISTED LAPAROSCOPIC TOTAL HYSTERECTOMY AND SALPINGECTOMY (Bilateral: Abdomen) CYSTOSCOPY (Left: Urethra)  Patient Location: PACU  Anesthesia Type:General  Level of Consciousness: awake, alert , and patient cooperative  Airway & Oxygen Therapy: Patient Spontanous Breathing and Patient connected to nasal cannula oxygen  Post-op Assessment: Report given to RN and Post -op Vital signs reviewed and stable  Post vital signs: Reviewed and stable  Last Vitals:  Vitals Value Taken Time  BP    Temp    Pulse    Resp    SpO2      Last Pain:  Vitals:   04/27/23 0607  TempSrc: Oral  PainSc: 5       Patients Stated Pain Goal: 5 (04/27/23 1308)  Complications: No notable events documented.

## 2023-04-27 NOTE — Anesthesia Postprocedure Evaluation (Signed)
Anesthesia Post Note  Patient: Sandra [name]  Procedure(s) Performed: XI ROBOTIC ASSISTED LAPAROSCOPIC TOTAL HYSTERECTOMY AND SALPINGECTOMY (Bilateral: Abdomen) CYSTOSCOPY (Left: Urethra)     Patient location during evaluation: PACU Anesthesia Type: General Level of consciousness: awake and alert Pain management: pain level controlled Vital Signs Assessment: post-procedure vital signs reviewed and stable Respiratory status: spontaneous breathing, nonlabored ventilation, respiratory function stable and patient connected to nasal cannula oxygen Cardiovascular status: blood pressure returned to baseline and stable Postop Assessment: no apparent nausea or vomiting Anesthetic complications: no   No notable events documented.  Last Vitals:  Vitals:   04/27/23 1230 04/27/23 1309  BP: 101/65 99/67  Pulse: 62 68  Resp: 14 15  Temp:  36.4 C  SpO2: 98% 100%    Last Pain:  Vitals:   04/27/23 1400  TempSrc:   PainSc: Asleep                 [place]

## 2023-04-27 NOTE — Anesthesia Procedure Notes (Signed)
Procedure Name: Intubation Date/Time: 04/27/2023 7:42 AM  Performed by: Francie Massing, CRNAPre-anesthesia Checklist: Patient identified, Emergency Drugs available, Suction available and Patient being monitored Patient Re-evaluated:Patient Re-evaluated prior to induction Oxygen Delivery Method: Circle system utilized Preoxygenation: Pre-oxygenation with 100% oxygen Induction Type: IV induction Ventilation: Mask ventilation without difficulty Laryngoscope Size: Mac and 3 Grade View: Grade I Tube type: Oral Tube size: 7.0 mm Number of attempts: 1 Airway Equipment and Method: Stylet and Oral airway Placement Confirmation: ETT inserted through vocal cords under direct vision, positive ETCO2 and breath sounds checked- equal and bilateral Secured at: 22 cm Tube secured with: Tape Dental Injury: Teeth and Oropharynx as per pre-operative assessment

## 2023-04-27 NOTE — Op Note (Addendum)
Sandra [name] PROCEDURE DATE: 04/27/2023  PREOPERATIVE DIAGNOSIS: abnormal uterine bleeding, pelvic pain, dysmenorrhea POSTOPERATIVE DIAGNOSIS: abnormal uterine bleeding, pelvic pain, dysmenorrhea, and endometriosis PROCEDURE: robotic assisted total laparoscopic hysterectomy, bilateral salpingectomy, cystoscopy, laparoscopic TAP block    SURGEON: Lorriane Shire, MD ASSISTANT:  Milas Hock, MD    An experienced assistant was required given the standard of surgical care given the complexity of the case.  This assistant was needed for exposure, dissection, suctioning, retraction, instrument exchange, and for overall help during the procedure.  INDICATIONS: 47 y.o. G2P2000 with AUB, pelvic pain, and dysmenorrhea.  Risks of surgery were discussed with the patient including but not limited to: bleeding which may require transfusion; infection which may require antibiotics; injury to surrounding organs; need for additional procedures including laparotomy;  and other postoperative/anesthesia complications. Written informed consent was obtained.    FINDINGS:  Normal external genitalia, 9 wk size mobile/immobile uterus with Normal contours.  Laparoscopically: normal upper abdominal survey, omental adhesions to the umbilicus, normal sized uterus with cystic appearing serosa, dense adhesions involving left adenxa and left uterosacral,  tortuous fallopian tubes, normal bilateral ovaries, bilateral ureters seen, utero-vesicle adhesions in the anterior cul de sac, dense left sided adhesion and endometriotic nodule involving the rectum in posterior cul de sac Cystoscopically: normal bladder wall without apparent injury, bilateral ureteral orifices, fluorescein-stained urine from bilateral ureteral orifices   ANESTHESIA: General INTRAVENOUS FLUIDS:  1100 ml of LR ESTIMATED BLOOD LOSS:  15 ml URINE OUTPUT: 200 ml SPECIMENS: uterus, cervix, bilateral fallopian tubes COMPLICATIONS:  None immediate.   The  risks, benefits, and alternatives of surgery were explained, understood, and accepted. Consents were signed. All questions were answered. She was taken to the operating room and general anesthesia was applied without complication. She was placed in the dorsal lithotomy position and her abdomen and vagina were prepped and draped after she had been carefully positioned on the table. A bimanual exam revealed a 9 week size uterus that was limited in mobiclity. Her adnexa were not enlarged. A Foley catheter was placed and it drained clear throughout the case. A speculum was placed and the cervix visualized. The cervix was measured and the uterus was sounded to 8 cm. A Rumi uterine manipulator was placed without difficulty.  Gloves were changed and attention was turned to the abdomen. All incisions were infiltrated with local anesthetic. A stab incision was made in the LUQ and the veress needle introduced. When intrabdominal entry confirmed with low opening pressure, the abdomen was insufflate. An 8mm incision was made in the LUQ and an optiview airseal trocar was introduced into the abdomen. The Entry was confirmed with visualization of the insufflated abdomen. After good pneumoperitoneum was established, the abdomen was surveyed including the upper abdomen.She was placed in Trendelenburg position and ports were placed in appropriate positions on her abdomen to allow maximum exposure during the robotic case. Specifically, trocars were placed  in the LLQ, RLQ, and RUQ. These were all placed under direct laparoscopic visualization after infiltration with local anesthetic. The omental adhesion was taken down sharply and bluntly. The final trocar was placed in the umbilicus. The robot was docked and I proceeded with a robotic portion of the case.  The pelvis was inspected and the uterus was found to have multiple adhesions anteriorly and posteriorly and the remaining findings as noted above.  These were released sharply.  The ureters and the infundibulopelvic ligaments were identified. The right round ligament was identified, cauterized and ligated, a bladder flap was initiated anteriorly.  The right fallopian tube was then divided and the tube passed off for specimen. The right utero-ovarian ligament was cauterized and divided. Attention was turned to the left where the fallopian tube was removed. The left utero-ovarian was within and adhesion. The round ligament was divided and the bladder flap initiated. The adnexal adhesion was sharply and bluntly dissected until a window, superior to the ureter, was made and the uteroovarian ligament isolate, cauterized, and divided. The uterine vessels were identified and cauterized and then cut on the left. Attention turned to the right and the anterior adhesion was taken down bluntly and sharply until the bladder flap was completed. The bladder was pushed out of the operative site and an anterior colpotomy was made. The colpotomy incision was extended circumferentially, following the blue outline of the Rumi manipulator. All pedicles were hemostatic.  The uterus was removed from the vagina with the fallopian tube segments. The vaginal cuff was closed with v-lock suture.  Excellent hemostasis was noted throughout. The pelvis was irrigated. The intraabdominal pressure was lowered assess hemostasis. The nodule previously visualized was noted to involve the rectum and decision made to not remove it.  After determining excellent hemostasis, arista applied to the surgical sites and the robot was undocked. At this point I performed cystoscopy. The cystoscopy revealed flurosceine ejection from both ureters.  Attention returned to the top and the TAP block was performed with fir first injection ~3cm below the costal margin and the second ~4cm above the ASIS; this was performed bilaterally.  The skin from all of the other ports was closed with 3-0 vicryl. Remaining exparel was injected into the  incisions.  The patient was then extubated and taken to recovery in stable condition.   Sponge, lap and needle counts were correct x 2.    Lorriane Shire, MD Minimally Invasive Gynecologic Surgery and Chronic Pelvic Pain Specialist Obstetrics and Gynecology, [facility] for [facility], [facility] 04/27/2023

## 2023-04-27 NOTE — H&P (Signed)
OB/GYN Pre-Op History and Physical  Sandra [name] is a 47 y.o. G2P2000 presenting for pelvic pain and fibroids and AUB.       Past Medical History:  Diagnosis Date   Anemia    Arthritis    knees, hips   Asthma    seasonal, last exacerbation 2 years ago as of 04/12/23   Complication of anesthesia    nausea and vomiting , dose well with patch   COVID-19 virus infection 08/07/2021   Fibromyalgia    Follows w/ rheumatology.   GERD (gastroesophageal reflux disease)    much improved per pt   Headache    occasional   Intraductal papilloma of breast, left 2022   IUD (intrauterine device) in place 2024   OAB (overactive bladder)    PONV (postoperative nausea and vomiting)    "patch works well."   Sarcoidosis 2019   Dr. Coralyn Helling, pulmonologist / As of 04/12/23 patient states that she is not having any lung problems and follows w/ PCP, Crosby Oyster, PA-C.   Uterine fibroid    Wears glasses     Past Surgical History:  Procedure Laterality Date   BREAST EXCISIONAL BIOPSY Right    2015   BREAST EXCISIONAL BIOPSY Left    2007 Papilloma removed   BREAST LUMPECTOMY WITH RADIOACTIVE SEED LOCALIZATION Left 04/08/2021   Procedure: LEFT BREAST LUMPECTOMY WITH RADIOACTIVE SEED LOCALIZATION;  Surgeon: Manus Rudd, MD;  Location: [facility];  Service: General;  Laterality: Left;   BREAST SURGERY     lt breast hematoma   CESAREAN SECTION     x 2   CHOLECYSTECTOMY N/A 06/28/2017   Procedure: LAPAROSCOPIC CHOLECYSTECTOMY;  Surgeon: Abigail Miyamoto, MD;  Location: [facility] OR;  Service: General;  Laterality: N/A;   DILATATION & CURETTAGE/HYSTEROSCOPY WITH MYOSURE N/A 03/12/2021   Procedure: DILATATION & CURETTAGE/HYSTEROSCOPY;  Surgeon: Cary Bing, MD;  Location: [facility] [place];  Service: Gynecology;  Laterality: N/A;   ESOPHAGOGASTRODUODENOSCOPY  05/2017   Dr. Loreta Ave   HERNIA REPAIR     Umbilical Hernia   INTRAUTERINE DEVICE (IUD) INSERTION N/A 03/12/2021    Procedure: INTRAUTERINE DEVICE (IUD) INSERTION Mirena;  Surgeon: [place] Bing, MD;  Location: [facility];  Service: Gynecology;  Laterality: N/A;   IUD REMOVAL N/A 03/12/2021   Procedure: INTRAUTERINE DEVICE (IUD) REMOVAL;  Surgeon: [place] Bing, MD;  Location: [facility] [place];  Service: Gynecology;  Laterality: N/A;   VIDEO BRONCHOSCOPY WITH ENDOBRONCHIAL ULTRASOUND N/A 09/28/2017   Procedure: VIDEO BRONCHOSCOPY WITH ENDOBRONCHIAL ULTRASOUND;  Surgeon: Leslye Peer, MD;  Location: [facility] OR;  Service: Thoracic;  Laterality: N/A;   WISDOM TOOTH EXTRACTION     2    OB History  Gravida Para Term Preterm AB Living  2 2 2         SAB IAB Ectopic Multiple Live Births               # Outcome Date GA Lbr Len/2nd Weight Sex Type Anes PTL Lv  2 Term 2000    F CS-LTranv     1 Term 1997    F CS-LTranv       Social History   Socioeconomic History   Marital status: Significant Other    Spouse name: Not on file   Number of children: Not on file   Years of education: Not on file   Highest education level: Not on file  Occupational History   Not on file  Tobacco Use   Smoking  status: Never   Smokeless tobacco: Never  Vaping Use   Vaping status: Never Used  Substance and Sexual Activity   Alcohol use: Not Currently    Alcohol/week: 3.0 standard drinks of alcohol    Types: 3 Shots of liquor per week    Comment: stopped in 2023   Drug use: Not Currently    Types: Marijuana    Comment: hx of marijuana use only   Sexual activity: Yes    Birth control/protection: I.U.D.  Other Topics Concern   Not on file  Social History Narrative   Lives with boyfriend, 2 daughters.  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  Exercise - goes to gym 2 days per week.   10/2022   Social Determinants of Health   Financial Resource Strain: Not on file  Food Insecurity: Not on file  Transportation Needs: Not on file  Physical Activity: Not on file   Stress: Not on file  Social Connections: Not on file    Family History  Problem Relation Age of Onset   Asthma Mother    Heart murmur Mother    Diabetes Mother    Sleep apnea Mother    Stomach cancer Maternal Grandmother    Cancer Maternal Grandmother        stomach   Cancer Maternal Grandfather        prostate   Cancer Paternal Grandmother        breast   Kidney disease Paternal Grandmother        dialysis   Sarcoidosis Neg Hx    Heart disease Neg Hx    Stroke Neg Hx    Breast cancer Neg Hx    Colon cancer Neg Hx    Colon polyps Neg Hx    Esophageal cancer Neg Hx    Rectal cancer Neg Hx     Medications Prior to Admission  Medication Sig Dispense Refill Last Dose   diclofenac (VOLTAREN) 75 MG EC tablet TAKE 1 TABLET TWICE DAILY FOR 1 WEEK, THEN TAKE 1 TABLET DAILY FOR 1 WEEK, THEN AS NEEDED 45 tablet 0 04/26/2023   gabapentin (NEURONTIN) 300 MG capsule Take 2 capsules (600 mg total) by mouth 2 (two) times daily. 180 capsule 0 04/26/2023   methocarbamol (ROBAXIN) 500 MG tablet TAKE 1 TABLET BY MOUTH EVERY 8 HOURS AS NEEDED FOR MUSCLE SPASM 60 tablet 1 04/26/2023   Multiple Vitamins-Minerals (HAIR SKIN & NAILS PO) Take 3 tablets by mouth daily.   04/26/2023   Relugolix-Estradiol-Norethind (MYFEMBREE) 40-1-0.5 MG TABS Take 1 tablet by mouth daily at 12 noon. 30 tablet 12 04/26/2023   Vitamin D, Ergocalciferol, (DRISDOL) 1.25 MG (50000 UNIT) CAPS capsule Take 1 capsule (50,000 Units total) by mouth every 7 (seven) days. 12 capsule 0 04/26/2023   levonorgestrel (MIRENA) 20 MCG/24HR IUD 1 each by Intrauterine route once.       Allergies  Allergen Reactions   Penicillins Rash and Other (See Comments)    PATIENT HAS HAD A PCN REACTION WITH IMMEDIATE RASH, FACIAL/TONGUE/THROAT SWELLING, SOB, OR LIGHTHEADEDNESS WITH HYPOTENSION:  #  #  #  YES  #  #  #   Has patient had a PCN reaction causing severe rash involving mucus membranes or skin necrosis: no Has patient had a PCN reaction that  required hospitalization: no Has patient had a PCN reaction occurring within the last 10 years: no    Prednisone Other (See Comments)    Causes open sores if she takes it for  a long time.   Influenza Vaccines Other (See Comments)    UNSPECIFIED ILLNESS was sick for 1 yr after getting flu shot    Review of Systems: Negative except for what is mentioned in HPI.     Physical Exam: BP 116/72   Pulse 84   Temp 97.8 F (36.6 C) (Oral)   Resp 16   Ht 5\' 2"  (1.575 m)   Wt 91.6 kg   LMP 04/11/2023   SpO2 97%   BMI 36.95 kg/m  CONSTITUTIONAL: Well-developed, well-nourished female in no acute distress.  HENT:  Normocephalic, atraumatic, External right and left ear normal. Oropharynx is clear and moist EYES: Conjunctivae and EOM are normal. Pupils are equal, round, and reactive to light. No scleral icterus.  NECK: Normal range of motion, supple, no masses SKIN: Skin is warm and dry. No rash noted. Not diaphoretic. No erythema. No pallor. NEUROLGIC: Alert and oriented to person, place, and time. Normal reflexes, muscle tone coordination. No cranial nerve deficit noted. PSYCHIATRIC: Normal mood and affect. Normal behavior. Normal judgment and thought content. CARDIOVASCULAR: Normal heart rate noted, regular rhythm RESPIRATORY: Effort and breath sounds normal, no problems with respiration noted PELVIC: Deferred MUSCULOSKELETAL: Normal range of motion. No edema and no tenderness. 2+ distal pulses.   Pertinent Labs/Studies:   Results for orders placed or performed during the hospital encounter of 04/27/23 (from the past 72 hour(s))  Pregnancy, urine POC     Status: None   Collection Time: 04/27/23  5:47 AM  Result Value Ref Range   Preg Test, Ur NEGATIVE NEGATIVE    Comment:        THE SENSITIVITY OF THIS METHODOLOGY IS >24 mIU/mL        Assessment and Plan :Raghad Lorenz is a 47 y.o. G2P2000 here for scheduled surgery.  Patient desires surgical management with RA-TLH, BS,  cysto.  The risks of surgery were discussed in detail with the patient including but not limited to: bleeding which may require transfusion or reoperation; infection which may require prolonged hospitalization or re-hospitalization and antibiotic therapy; injury to bowel, bladder, ureters and major vessels or other surrounding organs which may lead to other procedures; formation of adhesions; need for additional procedures including laparotomy or subsequent procedures secondary to intraoperative injury or abnormal pathology; thromboembolic phenomenon; incisional problems and other postoperative or anesthesia complications.  All questions were answered.    Printed patient education handouts about the procedure were given to the patient to review at home.    Lorriane Shire, M.D. Minimally Invasive Gynecologic Surgery and Pelvic Pain Specialist Attending Obstetrician & Gynecologist, [facility] for [employer], [facility]

## 2023-04-27 NOTE — Discharge Instructions (Addendum)

## 2023-04-28 DIAGNOSIS — M797 Fibromyalgia: Secondary | ICD-10-CM | POA: Diagnosis not present

## 2023-04-28 DIAGNOSIS — N8003 Adenomyosis of the uterus: Secondary | ICD-10-CM | POA: Diagnosis not present

## 2023-04-28 DIAGNOSIS — N946 Dysmenorrhea, unspecified: Secondary | ICD-10-CM | POA: Diagnosis not present

## 2023-04-28 DIAGNOSIS — N939 Abnormal uterine and vaginal bleeding, unspecified: Secondary | ICD-10-CM | POA: Diagnosis not present

## 2023-04-28 DIAGNOSIS — D251 Intramural leiomyoma of uterus: Secondary | ICD-10-CM | POA: Diagnosis not present

## 2023-04-28 DIAGNOSIS — D869 Sarcoidosis, unspecified: Secondary | ICD-10-CM | POA: Diagnosis not present

## 2023-04-28 DIAGNOSIS — N809 Endometriosis, unspecified: Secondary | ICD-10-CM | POA: Diagnosis not present

## 2023-04-28 DIAGNOSIS — J45909 Unspecified asthma, uncomplicated: Secondary | ICD-10-CM | POA: Diagnosis not present

## 2023-04-28 DIAGNOSIS — G8929 Other chronic pain: Secondary | ICD-10-CM | POA: Diagnosis not present

## 2023-04-28 LAB — SURGICAL PATHOLOGY

## 2023-04-28 MED ORDER — METHOCARBAMOL 500 MG PO TABS
ORAL_TABLET | ORAL | Status: AC
  Filled 2023-04-28: qty 1

## 2023-04-28 MED ORDER — ACETAMINOPHEN 500 MG PO TABS
ORAL_TABLET | ORAL | Status: AC
  Filled 2023-04-28: qty 2

## 2023-04-28 MED ORDER — GABAPENTIN 300 MG PO CAPS
ORAL_CAPSULE | ORAL | Status: AC
  Filled 2023-04-28: qty 2

## 2023-04-28 MED ORDER — KETOROLAC TROMETHAMINE 30 MG/ML IJ SOLN
INTRAMUSCULAR | Status: AC
  Filled 2023-04-28: qty 1

## 2023-04-28 NOTE — Progress Notes (Signed)
Gynecology Progress Note  Admission Date: 04/27/2023 Current Date: 04/28/2023 7:28 AM  Sandra [name] is a 47 y.o. G2P2000 HD#2/POD#1 admitted for after surgery for obs   History complicated by: Patient Active Problem List   Diagnosis Date Noted   Abnormal uterine bleeding (AUB) 04/27/2023   Pelvic pain 04/27/2023   Fibromyalgia 11/14/2021   History of sarcoidosis 11/14/2021   Eye pain, bilateral 08/11/2021   Screening for lipid disorders 08/11/2021   Screening for diabetes mellitus 08/11/2021   IUD (intrauterine device) in place 12/27/2020   Flat foot 12/27/2020   Paresthesia of both feet 12/27/2020   Need for Tdap vaccination 07/31/2019   Other nonspecific abnormal finding of lung field 07/31/2019   Pain of left lower extremity 12/13/2017   Sarcoidosis 10/11/2017   Headache syndrome 09/02/2017   Influenza vaccination declined 09/01/2017   Encounter for health maintenance examination in adult 09/01/2017   OAB (overactive bladder) 08/25/2017   Other fatigue 08/25/2017   Adenomyosis 01/31/2015   Dysmenorrhea 01/31/2015    ROS and patient/family/surgical history, located on admission H&P note dated 04/27/2023, have been reviewed, and there are no changes except as noted below Yesterday/Overnight Events:  Uncomplicated RA-TLH, BS, cysto Hypotensive in PACU - received fluids and CBC wnl  Subjective:  Doing well/better this AM. Has been ambulatory. Tolerating PO. Passing flatus. No vaginal bleeding. Has pain in her vaginal area. Abdomen mostly feels sore.   Objective:   Vitals:   04/27/23 1752 04/27/23 2143 04/28/23 0215 04/28/23 0630  BP: 115/66 104/62 103/62 109/71  Pulse: 70 77 88 66  Resp: 18 (!) 24 18 18   Temp: (!) 97.4 F (36.3 C) 99.2 F (37.3 C) 98.3 F (36.8 C) 97.6 F (36.4 C)  TempSrc:      SpO2: 99% 98% 98% 97%  Weight:      Height:        Temp:  [97.2 F (36.2 C)-99.2 F (37.3 C)] 97.6 F (36.4 C) (10/02 0630) Pulse Rate:  [57-88] 66 (10/02  0630) Resp:  [10-24] 18 (10/02 0630) BP: (80-117)/(50-80) 109/71 (10/02 0630) SpO2:  [95 %-100 %] 97 % (10/02 0630) I/O last 3 completed shifts: In: 4876.6 [P.O.:1720; I.V.:1948.3; IV Piggyback:1208.3] Out: 5115 [Urine:5100; Blood:15] No intake/output data recorded.  Intake/Output Summary (Last 24 hours) at 04/28/2023 0728 Last data filed at 04/28/2023 0630 Gross per 24 hour  Intake 4876.58 ml  Output 5115 ml  Net -238.42 ml     Current Vital Signs 24h Vital Sign Ranges  T 97.6 F (36.4 C) Temp  Avg: 97.9 F (36.6 C)  Min: 97.2 F (36.2 C)  Max: 99.2 F (37.3 C)  BP 109/71 BP  Min: 80/50  Max: 117/67  HR 66 Pulse  Avg: 66.5  Min: 57  Max: 88  RR 18 Resp  Avg: 15.3  Min: 10  Max: 24  SaO2 97 % Room Air SpO2  Avg: 98.3 %  Min: 95 %  Max: 100 %       24 Hour I/O Current Shift I/O  Time Ins Outs 10/01 0701 - 10/02 0700 In: 4876.6 [P.O.:1720; I.V.:1948.3] Out: 5115 [Urine:5100] No intake/output data recorded.   Patient Vitals for the past 12 hrs:  BP Temp Pulse Resp SpO2  04/28/23 0630 109/71 97.6 F (36.4 C) 66 18 97 %  04/28/23 0215 103/62 98.3 F (36.8 C) 88 18 98 %  04/27/23 2143 104/62 99.2 F (37.3 C) 77 (!) 24 98 %     Patient Vitals for the past 24  hrs:  BP Temp Pulse Resp SpO2  04/28/23 0630 109/71 97.6 F (36.4 C) 66 18 97 %  04/28/23 0215 103/62 98.3 F (36.8 C) 88 18 98 %  04/27/23 2143 104/62 99.2 F (37.3 C) 77 (!) 24 98 %  04/27/23 1752 115/66 (!) 97.4 F (36.3 C) 70 18 99 %  04/27/23 1430 99/64 -- 72 18 100 %  04/27/23 1309 99/67 97.6 F (36.4 C) 68 15 100 %  04/27/23 1230 101/65 -- 62 14 98 %  04/27/23 1216 97/65 -- (!) 57 16 96 %  04/27/23 1208 (!) 100/55 -- -- -- --  04/27/23 1203 (!) 85/56 -- 72 -- 97 %  04/27/23 1157 (!) 80/50 -- 77 18 100 %  04/27/23 1125 117/67 97.8 F (36.6 C) 60 13 97 %  04/27/23 1115 97/69 -- 68 14 97 %  04/27/23 1114 97/69 -- -- -- --  04/27/23 1108 -- 97.8 F (36.6 C) 60 14 98 %  04/27/23 1104 -- -- -- 14 --   04/27/23 1100 (!) 98/54 -- 61 16 97 %  04/27/23 1057 103/72 -- 63 14 99 %  04/27/23 1045 96/62 -- (!) 59 12 99 %  04/27/23 1039 -- -- 62 15 100 %  04/27/23 1030 114/80 -- 60 10 95 %  04/27/23 1029 -- -- 65 12 --  04/27/23 1020 115/75 -- 65 14 100 %  04/27/23 1015 112/75 -- 65 14 100 %  04/27/23 1005 (!) 99/50 (!) 97.2 F (36.2 C) 65 16 100 %    Physical exam: General appearance: alert, cooperative, and appears stated age Abdomen: soft, non-tender; bowel sounds normal; no masses,  no organomegaly GU: No gross VB Lungs: clear to auscultation bilaterally Heart: S1, S2 normal, no murmur, rub or gallop, regular rate and rhythm Extremities: no lower extremity edema Skin: intact, incisions clean/dry/intact Psych: appropriate Neurologic: Grossly normal  Medications Current Facility-Administered Medications  Medication Dose Route Frequency Provider Last Rate Last Admin   acetaminophen (TYLENOL) tablet 1,000 mg  1,000 mg Oral Q6H Anda Sobotta, MD   1,000 mg at 04/28/23 0007   bisacodyl (DULCOLAX) EC tablet 5 mg  5 mg Oral Daily PRN Maxden Naji, MD       gabapentin (NEURONTIN) capsule 600 mg  600 mg Oral BID Legrande Hao, MD   600 mg at 04/28/23 0646   ketorolac (TORADOL) 30 MG/ML injection 30 mg  30 mg Intravenous Q6H Ivry Pigue, MD   30 mg at 04/28/23 0450   Followed by   ibuprofen (ADVIL) tablet 600 mg  600 mg Oral Q6H Chriss Redel, MD       lactated ringers infusion   Intravenous Continuous Reniah Cottingham, MD 20 mL/hr at 04/27/23 1650 Rate Change at 04/27/23 1650   methocarbamol (ROBAXIN) tablet 500 mg  500 mg Oral TID PRN Lorriane Shire, MD   500 mg at 04/28/23 0234   ondansetron (ZOFRAN) tablet 4 mg  4 mg Oral Q6H PRN Lorriane Shire, MD       Or   ondansetron (ZOFRAN) injection 4 mg  4 mg Intravenous Q6H PRN Azariah Latendresse, MD   4 mg at 04/27/23 1201   polyethylene glycol (MIRALAX / GLYCOLAX) packet 17 g  17 g Oral Daily PRN Lorriane Shire, MD       simethicone (MYLICON) chewable tablet 80 mg  80 mg Oral QID PRN Lorriane Shire, MD   80 mg at 04/27/23 2202      Labs  Recent Labs  Lab  04/21/23 0838 04/27/23 1410  WBC 2.9* 7.3  HGB 11.5* 11.4*  HCT 35.9* 34.5*  PLT 229 234    No results for input(s): "NA", "K", "CL", "CO2", "BUN", "CREATININE", "CALCIUM", "PROT", "BILITOT", "ALKPHOS", "ALT", "AST", "GLUCOSE" in the last 168 hours.  Invalid input(s): "LABALBU"    Assessment & Plan:  Doing well - requesting return to work letter (works at [employer] primarily) *GYN: follow up surgical pathology. Reviewed vaginal pain likely due to specimen removal and possibly pelvic floor spasm * BP: improved *Pain: controled with APAP, ibuprofen, gabapentin and robaxin *FEN/GI: regular diet, SLIV *PPx: SCDs, IS *Dispo: home this AM  Code Status: Full Code   Lorriane Shire, MD Minimally Invasive Gynecologic Surgery Center for [facility] (Faculty Practice) 04/28/23 7:28 AM

## 2023-04-28 NOTE — Discharge Summary (Signed)
Gynecology Physician Postoperative Discharge Summary  Patient ID: Sandra [name] MRN: [id number] DOB/AGE: [birth year] 47 y.o.  Admit Date: 04/27/2023 Discharge Date: 04/28/2023  Preoperative Diagnoses: pelvic pain, dysmenorrhea, AUB, fibroids  Procedures: Procedure(s) (LRB): XI ROBOTIC ASSISTED LAPAROSCOPIC TOTAL HYSTERECTOMY AND SALPINGECTOMY (Bilateral) CYSTOSCOPY (Left)  Hospital Course:  Sandra [name] is a 47 y.o. G2P2000  admitted for scheduled surgery.  She underwent the procedures as mentioned above, her operation was uncomplicated. For further details about surgery, please refer to the operative report. Patient had an uncomplicated postoperative course. By time of discharge on POD#1, her pain was controlled on oral pain medications; she was ambulating, voiding without difficulty, tolerating regular diet and passing flatus. She was deemed stable for discharge to home.   Significant Labs:    Latest Ref Rng & Units 04/27/2023    2:10 PM 04/21/2023    8:38 AM 03/18/2023   12:28 PM  CBC  WBC 4.0 - 10.5 K/uL 7.3  2.9  3.0   Hemoglobin 12.0 - 15.0 g/dL 78.2  95.6  21.3   Hematocrit 36.0 - 46.0 % 34.5  35.9  37.0   Platelets 150 - 400 K/uL 234  229  263     Discharge Exam: Blood pressure 114/78, pulse 78, temperature 98 F (36.7 C), resp. rate 18, height 5\' 2"  (1.575 m), weight 91.6 kg, last menstrual period 04/11/2023, SpO2 98%. General appearance: alert and no distress  Resp: clear to auscultation bilaterally  Cardio: regular rate and rhythm  GI: soft, non-tender; bowel sounds normal; no masses, no organomegaly.  Incision: C/D/I, no erythema, no drainage noted Pelvic: scant blood on pad (done in presence of RN as chaperone)  Extremities: extremities normal, atraumatic, no cyanosis or edema and Homans sign is negative, no sign of DVT  Discharged Condition: Stable  Disposition: Discharge disposition: 01-Home or Self Care       Discharge Instructions     Discharge  patient   Complete by: As directed    Discharge disposition: 01-Home or Self Care   Discharge patient date: 04/28/2023      Allergies as of 04/28/2023       Reactions   Penicillins Rash, Other (See Comments)   PATIENT HAS HAD A PCN REACTION WITH IMMEDIATE RASH, FACIAL/TONGUE/THROAT SWELLING, SOB, OR LIGHTHEADEDNESS WITH HYPOTENSION:  #  #  #  YES  #  #  #   Has patient had a PCN reaction causing severe rash involving mucus membranes or skin necrosis: no Has patient had a PCN reaction that required hospitalization: no Has patient had a PCN reaction occurring within the last 10 years: no   Prednisone Other (See Comments)   Causes open sores if she takes it for a long time.   Influenza Vaccines Other (See Comments)   UNSPECIFIED ILLNESS was sick for 1 yr after getting flu shot        Medication List     STOP taking these medications    diclofenac 75 MG EC tablet Commonly known as: VOLTAREN   levonorgestrel 20 MCG/24HR IUD Commonly known as: MIRENA   Myfembree 40-1-0.5 MG Tabs Generic drug: Relugolix-Estradiol-Norethind       TAKE these medications    acetaminophen 500 MG tablet Commonly known as: TYLENOL Take 1 tablet (500 mg total) by mouth every 6 (six) hours as needed. Notes to patient: Next dose is due at 2 pm   gabapentin 300 MG capsule Commonly known as: NEURONTIN Take 2 capsules (600 mg total) by mouth 3 (three) times daily.  What changed: when to take this   HAIR SKIN & NAILS PO Take 3 tablets by mouth daily.   ibuprofen 800 MG tablet Commonly known as: ADVIL Take 1 tablet (800 mg total) by mouth 3 (three) times daily with meals as needed for headache, moderate pain or cramping.   methocarbamol 500 MG tablet Commonly known as: ROBAXIN Take 1 tablet (500 mg total) by mouth 3 (three) times daily as needed for muscle spasms. What changed: See the new instructions. Notes to patient: Next dose is due at 1030 am as needed for pain.   polyethylene glycol 17  g packet Commonly known as: MiraLax Take 17 g by mouth daily.   Vitamin D (Ergocalciferol) 1.25 MG (50000 UNIT) Caps capsule Commonly known as: DRISDOL Take 1 capsule (50,000 Units total) by mouth every 7 (seven) days.       No future appointments.   Total discharge time: 15 minutes   Signed:  Lorriane Shire, MD Minimally Invasive Gynecologic Surgery and Chronic Pelvic Pain Specialist Obstetrics and Gynecology, [facility] for [facility], [facility] 04/28/23

## 2023-04-29 ENCOUNTER — Encounter (HOSPITAL_BASED_OUTPATIENT_CLINIC_OR_DEPARTMENT_OTHER): Payer: Self-pay | Admitting: Obstetrics and Gynecology

## 2023-04-29 DIAGNOSIS — F4323 Adjustment disorder with mixed anxiety and depressed mood: Secondary | ICD-10-CM | POA: Diagnosis not present

## 2023-05-13 DIAGNOSIS — F4323 Adjustment disorder with mixed anxiety and depressed mood: Secondary | ICD-10-CM | POA: Diagnosis not present

## 2023-05-24 ENCOUNTER — Ambulatory Visit: Payer: 59 | Admitting: Orthopedic Surgery

## 2023-05-25 ENCOUNTER — Encounter: Payer: Self-pay | Admitting: Obstetrics and Gynecology

## 2023-05-25 ENCOUNTER — Ambulatory Visit (INDEPENDENT_AMBULATORY_CARE_PROVIDER_SITE_OTHER): Payer: 59 | Admitting: Obstetrics and Gynecology

## 2023-05-25 ENCOUNTER — Other Ambulatory Visit: Payer: Self-pay

## 2023-05-25 VITALS — BP 103/73 | HR 86 | Wt 209.5 lb

## 2023-05-25 DIAGNOSIS — Z09 Encounter for follow-up examination after completed treatment for conditions other than malignant neoplasm: Secondary | ICD-10-CM

## 2023-05-25 DIAGNOSIS — N898 Other specified noninflammatory disorders of vagina: Secondary | ICD-10-CM

## 2023-05-25 DIAGNOSIS — K5909 Other constipation: Secondary | ICD-10-CM

## 2023-05-25 MED ORDER — MAGNESIUM CITRATE PO SOLN
1.0000 | Freq: Once | ORAL | 2 refills | Status: AC
Start: 2023-05-25 — End: 2023-05-25

## 2023-05-25 MED ORDER — ESTRADIOL 10 MCG VA TABS
1.0000 | ORAL_TABLET | Freq: Every day | VAGINAL | 12 refills | Status: DC
Start: 2023-05-25 — End: 2023-08-31

## 2023-05-25 NOTE — Progress Notes (Signed)
POSTOPERATIVE VISIT NOTE   Subjective:     Sandra [name] is a 47 y.o. G2P2000 who presents to the clinic 4 weeks status post  RA-TLH, BS, cysto  for abnormal uterine bleeding and pelvic pain. Eating a regular diet without difficulty. Bowel movements are abnormal with significant constipation despite miralax, stool softner and smooth move tea. Up to 3 tea bags and no BM at times. Typically uses miralax to get started with BM but hasn't been helping more recently and found herself straining the other day . The patient is not having any pain. She notes complete resolution of the pain she had been feeling preop. Feels her abdomen is flatter and less bloated.  Incision: doing well. Pulled a stitch from umbilical incision but otherwise no issues Vaginal bleeding: none Notes having vaginal dryness. Did not have any problems with dryness priro to surgery  Resumed sexual acitivity:   The following portions of the patient's history were reviewed and updated as appropriate: allergies, current medications, past family history, past medical history, past social history, past surgical history, and problem list..   Review of Systems Pertinent items are noted in HPI.    Objective:    BP 103/73   Pulse 86   Wt 209 lb 8 oz (95 kg)   BMI 38.32 kg/m  General:  alert, cooperative, and no distress  Abdomen: soft, bowel sounds active, non-tender  Incision:   healing well, no drainage, no erythema, no hernia, no seroma, no swelling, no dehiscence, incision well approximated  Pelvic:   Exam deferred.    Pathology Results: FINAL MICROSCOPIC DIAGNOSIS:   A. UTERUS, CERVIX, BILATERAL FALLOPIAN TUBES, HYSTERECTOMY:  Cervix:           Unremarkable.            Negative for dysplasia or malignancy.        Endocervix:            Nabothian cysts.            Negative for hyperplasia, atypia or malignancy.        Endometrium:            Benign disordered proliferative endometrium.            Negative for  hyperplasia, atypia or malignancy.        Myometrium:            Leiomyoma, intramural.  Adenomyosis.           Negative for malignancy.        Serosa:            Unremarkable.            Negative for malignancy.        Bilateral fallopian tubes:            Benign fimbriated fallopian tubes with paratubal cysts.            Negative for malignancy.    Assessment:   Doing well postoperatively. Operative findings again reviewed. Pathology report discussed.   1. Postop check Doing well and feels pain she was experiencing prior to surgery has completed resolved. Noted likely due to adenomyosis which is diagnosed and treated via hysterectomy.   2. Other constipation Trial of magnesium citrate. If no improvement, should try enema.  - magnesium citrate SOLN; Take 296 mLs (1 Bottle total) by mouth once for 1 dose.  Dispense: 195 mL; Refill: 2  3. Vaginal dryness Vaginal estrogen for symptom management. Noted it  should help with symptoms and vaginal cuff healing.  - Estradiol 10 MCG TABS vaginal tablet; Place 1 tablet (10 mcg total) vaginally at bedtime. Nightly for 2 weeks then twice a week afterwards  Dispense: 30 tablet; Refill: 12    Plan:   1. Continue any current medications. 2. Reviewed pathology  3. Activity restrictions:  pelvic rest x12 weeks 4.  Follow up at ~ 8 weeks  Lorriane Shire, MD Obstetrician & Gynecologist, [facility] for [facility], [facility]

## 2023-05-27 DIAGNOSIS — F4323 Adjustment disorder with mixed anxiety and depressed mood: Secondary | ICD-10-CM | POA: Diagnosis not present

## 2023-06-09 ENCOUNTER — Ambulatory Visit: Payer: 59 | Admitting: Orthopedic Surgery

## 2023-06-11 DIAGNOSIS — F4323 Adjustment disorder with mixed anxiety and depressed mood: Secondary | ICD-10-CM | POA: Diagnosis not present

## 2023-06-20 ENCOUNTER — Other Ambulatory Visit: Payer: Self-pay | Admitting: Obstetrics and Gynecology

## 2023-06-22 ENCOUNTER — Other Ambulatory Visit: Payer: Self-pay

## 2023-06-22 ENCOUNTER — Ambulatory Visit (INDEPENDENT_AMBULATORY_CARE_PROVIDER_SITE_OTHER): Payer: 59 | Admitting: Obstetrics and Gynecology

## 2023-06-22 VITALS — BP 119/77 | HR 73 | Wt 208.0 lb

## 2023-06-22 DIAGNOSIS — Z09 Encounter for follow-up examination after completed treatment for conditions other than malignant neoplasm: Secondary | ICD-10-CM

## 2023-06-22 DIAGNOSIS — G8929 Other chronic pain: Secondary | ICD-10-CM

## 2023-06-22 DIAGNOSIS — N939 Abnormal uterine and vaginal bleeding, unspecified: Secondary | ICD-10-CM

## 2023-06-22 DIAGNOSIS — N946 Dysmenorrhea, unspecified: Secondary | ICD-10-CM

## 2023-06-22 DIAGNOSIS — R102 Pelvic and perineal pain: Secondary | ICD-10-CM

## 2023-06-22 NOTE — Progress Notes (Signed)
   POSTOPERATIVE VISIT NOTE   Subjective:     Sandra [name] is a 47 y.o. G2P2000 who presents to the clinic 8 weeks status post  RA-TLH, BS, cysto  for abnormal uterine bleeding. Eating a regular diet with difficulty. Bowel movements are normal. The patient is not having any pain. Vaginal bleeding: none Resumed sexual acitivity: not yet  Doing well overall. No longer having any pain. No partner and not interested in sexual intercourse as of yet. Notes that during PFPT previously it was very painful.   The following portions of the patient's history were reviewed and updated as appropriate: allergies, current medications, past family history, past medical history, past social history, past surgical history, and problem list..   Review of Systems Pertinent items are noted in HPI.    Objective:    BP 119/77   Pulse 73   Wt 208 lb (94.3 kg)   LMP 04/11/2023   BMI 38.04 kg/m  General:  alert, cooperative, and no distress  Abdomen: soft, bowel sounds active, non-tender  Incision:   healing well, no drainage, no erythema, no hernia, no seroma, no swelling, no dehiscence, incision well approximated  Pelvic:    Normal external genitalia, intact vaginal cuff, slightly tender levator ani bilaterally     Pathology Results: FINAL MICROSCOPIC DIAGNOSIS:   A. UTERUS, CERVIX, BILATERAL FALLOPIAN TUBES, HYSTERECTOMY:  Cervix:           Unremarkable.            Negative for dysplasia or malignancy.        Endocervix:            Nabothian cysts.            Negative for hyperplasia, atypia or malignancy.        Endometrium:            Benign disordered proliferative endometrium.            Negative for hyperplasia, atypia or malignancy.        Myometrium:            Leiomyoma, intramural.  Adenomyosis.           Negative for malignancy.        Serosa:            Unremarkable.            Negative for malignancy.        Bilateral fallopian tubes:            Benign fimbriated  fallopian tubes with paratubal cysts.            Negative for malignancy.    Assessment:   Doing well postoperatively. Operative findings again reviewed. Pathology report discussed.   Plan:    1. Postop check Doing well and meeting postop goals  2. Dysmenorrhea 3. Abnormal uterine bleeding (AUB) Resolved  4. Chronic female pelvic pain Soreness on pelvic exam today - recommend pelvic rest additional 4 weeks. If pain with penetration, recommend PFPT. Noted pain during prior PFPT attempt could be due to painful uterus vs pelvic myalgia secondary to longstanding dysmenorrhea.   Activity restrictions:  pelvic rest until 4 more weeks Anticipated return to work: not applicable. Follow up: as needed  Lorriane Shire, MD Obstetrician & Gynecologist, [facility] for [employer], [facility]

## 2023-06-23 ENCOUNTER — Telehealth: Payer: Self-pay | Admitting: Radiology

## 2023-06-23 ENCOUNTER — Ambulatory Visit: Payer: 59 | Admitting: Orthopedic Surgery

## 2023-06-23 ENCOUNTER — Encounter: Payer: Self-pay | Admitting: Orthopedic Surgery

## 2023-06-23 DIAGNOSIS — G8929 Other chronic pain: Secondary | ICD-10-CM

## 2023-06-23 DIAGNOSIS — N939 Abnormal uterine and vaginal bleeding, unspecified: Secondary | ICD-10-CM

## 2023-06-23 DIAGNOSIS — M5412 Radiculopathy, cervical region: Secondary | ICD-10-CM | POA: Diagnosis not present

## 2023-06-23 DIAGNOSIS — R102 Pelvic and perineal pain: Secondary | ICD-10-CM | POA: Diagnosis not present

## 2023-06-23 MED ORDER — GABAPENTIN 300 MG PO CAPS: 600.0000 mg | ORAL_CAPSULE | Freq: Three times a day (TID) | ORAL | 0 refills | Status: DC

## 2023-06-23 MED ORDER — METHOCARBAMOL 500 MG PO TABS: 500.0000 mg | ORAL_TABLET | Freq: Three times a day (TID) | ORAL | 1 refills | Status: DC | PRN

## 2023-06-23 NOTE — Telephone Encounter (Signed)
Order cervical MRI in 4 weeks, per Dr. August Saucer.  Patient needs 6 weeks of PT, I placed PT order today.

## 2023-06-23 NOTE — Progress Notes (Signed)
Office Visit Note   Patient: Sandra [name]           Date of Birth: [birth year]           MRN: [id number] Visit Date: 06/23/2023 Requested by: Jac Canavan, PA-C [address] [place],  [place] [address] PCP: Jac Canavan, PA-C  Subjective: Chief Complaint  Patient presents with   Neck - Follow-up   Right Shoulder - Follow-up    HPI: Sandra [name] is a 47 y.o. female who presents to the office reporting continued neck pain and right shoulder radiculopathy.  She feels a lot of pressure in the neck.  Not having any weakness yet.  She is recovering from hysterectomy surgery.  That has prevented her from doing in-depth back rehab exercises.  She reports "deep bone pain in the right arm" which involves the whole arm.  She does take gabapentin for sarcoid.  She is out of muscle relaxers.  MRI scan denied by insurance..                ROS: All systems reviewed are negative as they relate to the chief complaint within the history of present illness.  Patient denies fevers or chills.  Assessment & Plan: Visit Diagnoses:  1. Radiculopathy, cervical region   2. Chronic female pelvic pain   3. Abnormal uterine bleeding (AUB)     Plan: Impression is clear right-sided radiculopathy with normal shoulder examination.  Gabapentin refilled Robaxin refilled.  Physical therapy for the next 6 weeks to improve her radiculopathy situation.  Follow-up at that time for repeat clinical evaluation and likely MRI scan with epidural steroid injections to follow.  Follow-Up Instructions: No follow-ups on file.   Orders:  Orders Placed This Encounter  Procedures   Ambulatory referral to Physical Therapy   Meds ordered this encounter  Medications   gabapentin (NEURONTIN) 300 MG capsule    Sig: Take 2 capsules (600 mg total) by mouth 3 (three) times daily.    Dispense:  180 capsule    Refill:  0   methocarbamol (ROBAXIN) 500 MG tablet    Sig: Take 1 tablet (500 mg total) by mouth every 8  (eight) hours as needed for muscle spasms.    Dispense:  30 tablet    Refill:  1      Procedures: No procedures performed   Clinical Data: No additional findings.  Objective: Vital Signs: LMP 04/11/2023   Physical Exam:  Constitutional: Patient appears well-developed HEENT:  Head: Normocephalic Eyes:EOM are normal Neck: Normal range of motion Cardiovascular: Normal rate Pulmonary/chest: Effort normal Neurologic: Patient is alert Skin: Skin is warm Psychiatric: Patient has normal mood and affect  Ortho Exam: Ortho exam demonstrates pretty reasonable cervical spine range of motion with flexion extension and rotation.  5 out of 5 grip EPL FPL Rosser/extension bicep triceps and deltoid strength.  No definite paresthesias C5-T1.  Shoulder examination is normal with excellent range of motion of 70/100/170 with excellent rotator cuff strength infraspinatus extremities and subscap muscle testing and no AC joint tenderness and no coarseness or grinding with internal/external rotation of the right shoulder at 90 degrees of abduction.  Specialty Comments:  No specialty comments available.  Imaging: No results found.   PMFS History: Patient Active Problem List   Diagnosis Date Noted   Abnormal uterine bleeding (AUB) 04/27/2023   Pelvic pain 04/27/2023   Fibromyalgia 11/14/2021   History of sarcoidosis 11/14/2021   Eye pain, bilateral 08/11/2021   Screening for lipid disorders  08/11/2021   Screening for diabetes mellitus 08/11/2021   IUD (intrauterine device) in place 12/27/2020   Flat foot 12/27/2020   Paresthesia of both feet 12/27/2020   Need for Tdap vaccination 07/31/2019   Other nonspecific abnormal finding of lung field 07/31/2019   Pain of left lower extremity 12/13/2017   Sarcoidosis 10/11/2017   Headache syndrome 09/02/2017   Influenza vaccination declined 09/01/2017   Encounter for health maintenance examination in adult 09/01/2017   OAB (overactive bladder)  08/25/2017   Other fatigue 08/25/2017   Adenomyosis 01/31/2015   Dysmenorrhea 01/31/2015   Past Medical History:  Diagnosis Date   Anemia    Arthritis    knees, hips   Asthma    seasonal, last exacerbation 2 years ago as of 04/12/23   Complication of anesthesia    nausea and vomiting , dose well with patch   COVID-19 virus infection 08/07/2021   Fibromyalgia    Follows w/ rheumatology.   GERD (gastroesophageal reflux disease)    much improved per pt   Headache    occasional   Intraductal papilloma of breast, left 2022   IUD (intrauterine device) in place 2024   OAB (overactive bladder)    PONV (postoperative nausea and vomiting)    "patch works well."   Sarcoidosis 2019   Dr. Coralyn Helling, pulmonologist / As of 04/12/23 patient states that she is not having any lung problems and follows w/ PCP, Crosby Oyster, PA-C.   Uterine fibroid    Wears glasses     Family History  Problem Relation Age of Onset   Asthma Mother    Heart murmur Mother    Diabetes Mother    Sleep apnea Mother    Stomach cancer Maternal Grandmother    Cancer Maternal Grandmother        stomach   Cancer Maternal Grandfather        prostate   Cancer Paternal Grandmother        breast   Kidney disease Paternal Grandmother        dialysis   Sarcoidosis Neg Hx    Heart disease Neg Hx    Stroke Neg Hx    Breast cancer Neg Hx    Colon cancer Neg Hx    Colon polyps Neg Hx    Esophageal cancer Neg Hx    Rectal cancer Neg Hx     Past Surgical History:  Procedure Laterality Date   BREAST EXCISIONAL BIOPSY Right    2015   BREAST EXCISIONAL BIOPSY Left    2007 Papilloma removed   BREAST LUMPECTOMY WITH RADIOACTIVE SEED LOCALIZATION Left 04/08/2021   Procedure: LEFT BREAST LUMPECTOMY WITH RADIOACTIVE SEED LOCALIZATION;  Surgeon: Manus Rudd, MD;  Location: [facility];  Service: General;  Laterality: Left;   BREAST SURGERY     lt breast hematoma   CESAREAN SECTION     x 2    CHOLECYSTECTOMY N/A 06/28/2017   Procedure: LAPAROSCOPIC CHOLECYSTECTOMY;  Surgeon: Abigail Miyamoto, MD;  Location: [facility];  Service: General;  Laterality: N/A;   CYSTOSCOPY Left 04/27/2023   Procedure: CYSTOSCOPY;  Surgeon: Lorriane Shire, MD;  Location: [facility];  Service: Gynecology;  Laterality: Left;   DILATATION & CURETTAGE/HYSTEROSCOPY WITH MYOSURE N/A 03/12/2021   Procedure: DILATATION & CURETTAGE/HYSTEROSCOPY;  Surgeon: [place] Bing, MD;  Location: [facility];  Service: Gynecology;  Laterality: N/A;   ESOPHAGOGASTRODUODENOSCOPY  05/2017   Dr. Loreta Ave   HERNIA REPAIR     Umbilical Hernia  INTRAUTERINE DEVICE (IUD) INSERTION N/A 03/12/2021   Procedure: INTRAUTERINE DEVICE (IUD) INSERTION Mirena;  Surgeon: Ezel Bing, MD;  Location[facility];  Service: Gynecology;  Laterality: N/A;   IUD REMOVAL N/A 03/12/2021   Procedure: INTRAUTERINE DEVICE (IUD) REMOVAL;  Surgeon: Manderson Bing, MD;  Location: [facility] [place];  Service: Gynecology;  Laterality: N/A;   ROBOTIC ASSISTED LAPAROSCOPIC HYSTERECTOMY AND SALPINGECTOMY Bilateral 04/27/2023   Procedure: XI ROBOTIC ASSISTED LAPAROSCOPIC TOTAL HYSTERECTOMY AND SALPINGECTOMY;  Surgeon: Lorriane Shire, MD;  Location: [facility];  Service: Gynecology;  Laterality: Bilateral;   VIDEO BRONCHOSCOPY WITH ENDOBRONCHIAL ULTRASOUND N/A 09/28/2017   Procedure: VIDEO BRONCHOSCOPY WITH ENDOBRONCHIAL ULTRASOUND;  Surgeon: Leslye Peer, MD;  Location: [facility];  Service: Thoracic;  Laterality: N/A;   WISDOM TOOTH EXTRACTION     2   Social History   Occupational History   Not on file  Tobacco Use   Smoking status: Never   Smokeless tobacco: Never  Vaping Use   Vaping status: Never Used  Substance and Sexual Activity   Alcohol use: Not Currently    Alcohol/week: 3.0 standard drinks of alcohol    Types: 3 Shots of liquor per week    Comment: stopped in 2023    Drug use: Not Currently    Types: Marijuana    Comment: hx of marijuana use only   Sexual activity: Yes    Birth control/protection: I.U.D.

## 2023-06-28 ENCOUNTER — Other Ambulatory Visit: Payer: Self-pay

## 2023-06-28 DIAGNOSIS — M5412 Radiculopathy, cervical region: Secondary | ICD-10-CM

## 2023-06-28 NOTE — Telephone Encounter (Signed)
MRI ordered

## 2023-07-08 ENCOUNTER — Encounter: Payer: Self-pay | Admitting: Orthopedic Surgery

## 2023-07-09 DIAGNOSIS — F4323 Adjustment disorder with mixed anxiety and depressed mood: Secondary | ICD-10-CM | POA: Diagnosis not present

## 2023-07-12 ENCOUNTER — Encounter: Payer: Self-pay | Admitting: Physical Therapy

## 2023-07-12 ENCOUNTER — Ambulatory Visit: Payer: 59 | Admitting: Physical Therapy

## 2023-07-12 DIAGNOSIS — M6281 Muscle weakness (generalized): Secondary | ICD-10-CM

## 2023-07-12 DIAGNOSIS — M542 Cervicalgia: Secondary | ICD-10-CM | POA: Diagnosis not present

## 2023-07-12 DIAGNOSIS — R293 Abnormal posture: Secondary | ICD-10-CM | POA: Diagnosis not present

## 2023-07-12 NOTE — Therapy (Signed)
OUTPATIENT PHYSICAL THERAPY EVALUATION   Patient Name: Sandra [name] MRN: [id number] DOB:25-Jul-1976, 47 y.o., female Today's Date: 07/12/2023  END OF SESSION:  PT End of Session - 07/12/23 1512     Visit Number 1    Number of Visits 6    Date for PT Re-Evaluation 08/23/23    Authorization Type [employer]; 30 visit limit    PT Start Time 1510    PT Stop Time 1541    PT Time Calculation (min) 31 min    Activity Tolerance Patient tolerated treatment well;Patient limited by pain             Past Medical History:  Diagnosis Date   Anemia    Arthritis    knees, hips   Asthma    seasonal, last exacerbation 2 years ago as of 04/12/23   Complication of anesthesia    nausea and vomiting , dose well with patch   COVID-19 virus infection 08/07/2021   Fibromyalgia    Follows w/ rheumatology.   GERD (gastroesophageal reflux disease)    much improved per pt   Headache    occasional   Intraductal papilloma of breast, left 2022   IUD (intrauterine device) in place 2024   OAB (overactive bladder)    PONV (postoperative nausea and vomiting)    "patch works well."   Sarcoidosis 2019   Dr. Coralyn Helling, pulmonologist / As of 04/12/23 patient states that she is not having any lung problems and follows w/ PCP, Crosby Oyster, PA-C.   Uterine fibroid    Wears glasses    Past Surgical History:  Procedure Laterality Date   BREAST EXCISIONAL BIOPSY Right    2015   BREAST EXCISIONAL BIOPSY Left    2007 Papilloma removed   BREAST LUMPECTOMY WITH RADIOACTIVE SEED LOCALIZATION Left 04/08/2021   Procedure: LEFT BREAST LUMPECTOMY WITH RADIOACTIVE SEED LOCALIZATION;  Surgeon: Manus Rudd, MD;  Location: [facility];  Service: General;  Laterality: Left;   BREAST SURGERY     lt breast hematoma   CESAREAN SECTION     x 2   CHOLECYSTECTOMY N/A 06/28/2017   Procedure: LAPAROSCOPIC CHOLECYSTECTOMY;  Surgeon: Abigail Miyamoto, MD;  Location: [facility];  Service: General;  Laterality:  N/A;   CYSTOSCOPY Left 04/27/2023   Procedure: CYSTOSCOPY;  Surgeon: Lorriane Shire, MD;  Location: [facility];  Service: Gynecology;  Laterality: Left;   DILATATION & CURETTAGE/HYSTEROSCOPY WITH MYOSURE N/A 03/12/2021   Procedure: DILATATION & CURETTAGE/HYSTEROSCOPY;  Surgeon: [place] Bing, MD;  Location: [facility];  Service: Gynecology;  Laterality: N/A;   ESOPHAGOGASTRODUODENOSCOPY  05/2017   Dr. Loreta Ave   HERNIA REPAIR     Umbilical Hernia   INTRAUTERINE DEVICE (IUD) INSERTION N/A 03/12/2021   Procedure: INTRAUTERINE DEVICE (IUD) INSERTION Mirena;  Surgeon: [place] Bing, MD;  Location: [facility];  Service: Gynecology;  Laterality: N/A;   IUD REMOVAL N/A 03/12/2021   Procedure: INTRAUTERINE DEVICE (IUD) REMOVAL;  Surgeon: [place] Bing, MD;  Location: [facility];  Service: Gynecology;  Laterality: N/A;   ROBOTIC ASSISTED LAPAROSCOPIC HYSTERECTOMY AND SALPINGECTOMY Bilateral 04/27/2023   Procedure: XI ROBOTIC ASSISTED LAPAROSCOPIC TOTAL HYSTERECTOMY AND SALPINGECTOMY;  Surgeon: Lorriane Shire, MD;  Location:  SURGERY CENTER;  Service: Gynecology;  Laterality: Bilateral;   VIDEO BRONCHOSCOPY WITH ENDOBRONCHIAL ULTRASOUND N/A 09/28/2017   Procedure: VIDEO BRONCHOSCOPY WITH ENDOBRONCHIAL ULTRASOUND;  Surgeon: Leslye Peer, MD;  [facility]: [facility];  Service: Thoracic;  Laterality: N/A;   WISDOM TOOTH EXTRACTION  2   Patient Active Problem List   Diagnosis Date Noted   Abnormal uterine bleeding (AUB) 04/27/2023   Pelvic pain 04/27/2023   Fibromyalgia 11/14/2021   History of sarcoidosis 11/14/2021   Eye pain, bilateral 08/11/2021   Screening for lipid disorders 08/11/2021   Screening for diabetes mellitus 08/11/2021   IUD (intrauterine device) in place 12/27/2020   Flat foot 12/27/2020   Paresthesia of both feet 12/27/2020   Need for Tdap vaccination 07/31/2019   Other nonspecific abnormal finding of  lung field 07/31/2019   Pain of left lower extremity 12/13/2017   Sarcoidosis 10/11/2017   Headache syndrome 09/02/2017   Influenza vaccination declined 09/01/2017   Encounter for health maintenance examination in adult 09/01/2017   OAB (overactive bladder) 08/25/2017   Other fatigue 08/25/2017   Adenomyosis 01/31/2015   Dysmenorrhea 01/31/2015    PCP: Jac Canavan, PA-C  REFERRING PROVIDER: Cammy Copa, MD  REFERRING DIAG: [phone] (ICD-10-CM) - Radiculopathy, cervical region  Rationale for Evaluation and Treatment: Rehabilitation  THERAPY DIAG:  Cervicalgia - Plan: PT plan of care cert/re-cert  Muscle weakness (generalized) - Plan: PT plan of care cert/re-cert  Abnormal posture - Plan: PT plan of care cert/re-cert  ONSET DATE: about 1 year   SUBJECTIVE:                                                                                                                                                                                           SUBJECTIVE STATEMENT: Pt reports about a 1 year history of neck and shoulder pain.  She is also having numbness into her Rt hand.    PERTINENT HISTORY:  Arthritis, asthma, fibromyalgia, sarcoidosis  PAIN:  Are you having pain? Yes: NPRS scale: 6 currently; up to 9-10, at best 4/10 Pain location: Rt side neck and UE Pain description: sharp, pressure, pulling Aggravating factors: going to sleep on Rt side Relieving factors: heat, TENS unit  PRECAUTIONS:  None  RED FLAGS: None   WEIGHT BEARING RESTRICTIONS:  No  FALLS:  Has patient fallen in last 6 months? 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  Facet arthritis present at C7-T1.  Lordosis maintained. MRI ordered  PATIENT SURVEYS:  07/12/23 FOTO 44 (  predicted 60)  COGNITIVE STATUS: Within functional limits for tasks assessed   SENSATION: WFL  POSTURE:  rounded shoulders and forward head  HAND DOMINANCE:  Right  GAIT: 07/12/23 Comments: independent  PALPATION: 07/12/23 very tender to light palpation Rt upper trap and cervical paraspinals   CERVICAL ROM: Pain with all motions except Lt rotation  ROM A/PROM (deg) eval  Flexion 48  Extension 32  Right lateral flexion 22  Left lateral flexion 32   Right rotation 59  Left rotation 65   (Blank rows = not tested)    UPPER EXTREMITY MMT:  Giveway weakness noted with Rt shoulder testing  MMT Right eval Left eval  Shoulder flexion 3/5 3+/5  Shoulder abduction 3/5 3+/5  Shoulder internal rotation 5/5 5/5  Shoulder external rotation 4/5 4/5  Middle trapezius    Lower trapezius    Elbow flexion    Elbow extension    Wrist flexion    Wrist extension    Wrist ulnar deviation    Wrist radial deviation    Wrist pronation    Wrist supination    Grip strength 36# 62.5#   (Blank rows = not tested)    SPECIAL TESTS:  07/12/23 Cervical Spurling's test: increase in symptoms Rt/Lt and Distraction test: Negative  TREATMENT:                                                                                                                              DATE:  07/12/23 See HEP - performed trial reps with min cues PRN for comprehension     PATIENT EDUCATION:  Education details: HEP Person educated: Patient Education method: Explanation, Demonstration, and Handouts Education comprehension: verbalized understanding, returned demonstration, and needs further education  HOME EXERCISE PROGRAM: Access Code: [email or web address] URL: [email or web address] Date: 07/12/2023 Prepared by:  Moshe Cipro  Exercises - Standing Scapular Retraction  - 2 x daily - 7 x weekly - 1 sets - 10 reps - 5 sec hold - Standing Backward Shoulder Rolls  - 2 x daily - 7 x weekly - 1 sets - 10 reps - Half Neck Circles  - 2 x daily - 7 x weekly - 1 sets - 10 reps - Supine Chin Tuck  - 2 x daily - 7 x weekly - 1 sets - 10 reps - 5 sec hold - Standing Upper Trapezius Mobilization with Small Ball  - 1 x daily - 7 x weekly - 1 sets - 1-2 reps - 2-3 min hold   ASSESSMENT:  CLINICAL IMPRESSION: Patient is a 47 y.o. female who was seen today for physical therapy evaluation and treatment for chronic neck pain. She demonstrates decreases strength, postural abnormalities, and continued pain affecting functional mobility.  She will benefit from PT to address deficits listed.     OBJECTIVE IMPAIRMENTS: decreased ROM, decreased strength, increased fascial restrictions, increased muscle spasms, impaired UE functional use, postural dysfunction, and pain.   ACTIVITY LIMITATIONS: carrying, lifting, sleeping, bed  mobility, bathing, toileting, dressing, reach over head, and locomotion level  PARTICIPATION LIMITATIONS: meal prep, cleaning, laundry, driving, shopping, community activity, and occupation  PERSONAL FACTORS: 3+ comorbidities: Arthritis, asthma, fibromyalgia, sarcoidosis  are also affecting patient's functional outcome.   REHAB POTENTIAL: Good  CLINICAL DECISION MAKING: Evolving/moderate complexity  EVALUATION COMPLEXITY: Moderate   GOALS: Goals reviewed with patient? Yes  SHORT TERM GOALS: Target date: 08/02/2023  Independent with initial HEP Goal status: INITIAL   LONG TERM GOALS: Target date: 08/23/2023  Independent with final HEP Goal status: INITIAL  2.  FOTO score improved to 60 Goal status: INITIAL  3.  Cervical ROM WNL without increase in pain for improved tolerance and activity Goal status: INIITAL  4.  Report pain < 4/10 with sleeping and overhead activities for  improved function Goal status: INITIAL  5.  Rt grip strength improved to at least 50# for improved function Goal status: INITIAL     PLAN:  PT FREQUENCY: 1x/week  PT DURATION: 6 weeks  PLANNED INTERVENTIONS: 97164- PT Re-evaluation, 97110-Therapeutic exercises, 97530- Therapeutic activity, [id number]- Neuromuscular re-education, 97535- Self Care, 19147- Manual therapy, [id number]- Aquatic Therapy, 97014- Electrical stimulation (unattended), [id number]- Ultrasound, 82956- Traction (mechanical), Patient/Family education, Taping, Dry Needling, Joint mobilization, Spinal manipulation, Spinal mobilization, Cryotherapy, and Moist heat.  PLAN FOR NEXT SESSION: review HEP, manual/modalities PRN for pain; consider traction   NEXT MD VISIT: Not scheduled; after MRI   Clarita Crane, PT, DPT 07/12/23 3:50 PM

## 2023-07-17 ENCOUNTER — Ambulatory Visit
Admission: RE | Admit: 2023-07-17 | Discharge: 2023-07-17 | Disposition: A | Discharge status: Home / Self Care | Payer: 59 | Source: Ambulatory Visit | Attending: Orthopedic Surgery | Admitting: Orthopedic Surgery

## 2023-07-17 DIAGNOSIS — M47812 Spondylosis without myelopathy or radiculopathy, cervical region: Secondary | ICD-10-CM | POA: Diagnosis not present

## 2023-07-17 DIAGNOSIS — M5021 Other cervical disc displacement,  high cervical region: Secondary | ICD-10-CM | POA: Diagnosis not present

## 2023-07-17 DIAGNOSIS — M5412 Radiculopathy, cervical region: Secondary | ICD-10-CM

## 2023-07-23 ENCOUNTER — Telehealth (INDEPENDENT_AMBULATORY_CARE_PROVIDER_SITE_OTHER): Payer: 59 | Admitting: Medical

## 2023-07-23 VITALS — Wt 200.0 lb

## 2023-07-23 DIAGNOSIS — J45909 Unspecified asthma, uncomplicated: Secondary | ICD-10-CM

## 2023-07-23 DIAGNOSIS — B349 Viral infection, unspecified: Secondary | ICD-10-CM

## 2023-07-23 DIAGNOSIS — R051 Acute cough: Secondary | ICD-10-CM | POA: Diagnosis not present

## 2023-07-23 MED ORDER — AZITHROMYCIN 250 MG PO TABS: ORAL_TABLET | ORAL | 0 refills | Status: DC

## 2023-07-23 MED ORDER — ALBUTEROL SULFATE HFA 108 (90 BASE) MCG/ACT IN AERS: 2.0000 | INHALATION_SPRAY | Freq: Four times a day (QID) | RESPIRATORY_TRACT | 1 refills | Status: AC | PRN

## 2023-07-23 NOTE — Progress Notes (Signed)
Subjective:     Patient ID: Sandra [name], female   DOB: 1975/07/31, 47 y.o.   MRN: [id number]  This visit type was conducted due to national recommendations for restrictions regarding the COVID-19 Pandemic (e.g. social distancing) in an effort to limit this patient's exposure and mitigate transmission in our community.  Due to their co-morbid illnesses, this patient is at least at moderate risk for complications without adequate follow up.  This format is felt to be most appropriate for this patient at this time.    Documentation for virtual audio and video telecommunications through [place] encounter:  The patient was located at home. The provider was located in the office. The patient did consent to this visit and is aware of possible charges through their insurance for this visit.  The other persons participating in this telemedicine service were none. Time spent on call was 20 minutes and in review of previous records 20 minutes total.  This virtual service is not related to other E/M service within previous 7 days.   HPI Chief Complaint  Patient presents with   Cough    Bad cough, hoarseness , no other symptoms. X 2 days.    Virtual consult for illness.  She notes she is on day 3 of symptoms.  She has back cough, hoarseness, some sore throat, some aches, some chest congestion.  Getting at some phlegm.  No head pressure or headache.  Feels a slightly short of breath.  No fever, no chills, no ear pain.  Feels little tired.  Using Robitussin DM.  She has not had using albuterol in a few years but would like to have an on hand.  She has not done a COVID and flu test.  No sick contacts.  No other aggravating or relieving factors. No other complaint.  Past Medical History:  Diagnosis Date   Anemia    Arthritis    knees, hips   Asthma    seasonal, last exacerbation 2 years ago as of 04/12/23   Complication of anesthesia    nausea and vomiting , dose well with patch   COVID-19  virus infection 08/07/2021   Fibromyalgia    Follows w/ rheumatology.   GERD (gastroesophageal reflux disease)    much improved per pt   Headache    occasional   Intraductal papilloma of breast, left 2022   IUD (intrauterine device) in place 2024   OAB (overactive bladder)    PONV (postoperative nausea and vomiting)    "patch works well."   Sarcoidosis 2019   Dr. Coralyn Helling, pulmonologist / As of 04/12/23 patient states that she is not having any lung problems and follows w/ PCP, Crosby Oyster, PA-C.   Uterine fibroid    Wears glasses    Current Outpatient Medications on File Prior to Visit  Medication Sig Dispense Refill   acetaminophen (TYLENOL) 500 MG tablet Take 1 tablet (500 mg total) by mouth every 6 (six) hours as needed. 30 tablet 0   Estradiol 10 MCG TABS vaginal tablet Place 1 tablet (10 mcg total) vaginally at bedtime. Nightly for 2 weeks then twice a week afterwards 30 tablet 12   gabapentin (NEURONTIN) 300 MG capsule Take 2 capsules (600 mg total) by mouth 3 (three) times daily. 180 capsule 0   ibuprofen (ADVIL) 800 MG tablet TAKE 1 TABLET (800 MG TOTAL) BY MOUTH 3 (THREE) TIMES DAILY WITH MEALS AS NEEDED FOR HEADACHE, MODERATE PAIN OR CRAMPING. 90 tablet 1   methocarbamol (ROBAXIN) 500 MG tablet  Take 1 tablet (500 mg total) by mouth every 8 (eight) hours as needed for muscle spasms. 30 tablet 1   Multiple Vitamins-Minerals (HAIR SKIN & NAILS PO) Take 3 tablets by mouth daily.     polyethylene glycol (MIRALAX) 17 g packet Take 17 g by mouth daily. 14 each 2   Vitamin D, Ergocalciferol, (DRISDOL) 1.25 MG (50000 UNIT) CAPS capsule Take 1 capsule (50,000 Units total) by mouth every 7 (seven) days. 12 capsule 0   No current facility-administered medications on file prior to visit.    Review of Systems As in subjective    Objective:   Physical Exam Due to coronavirus pandemic stay at home measures, patient visit was virtual and they were not examined in person.   Wt 200  lb (90.7 kg)   LMP 04/11/2023   BMI 36.58 kg/m   Gen: wd, wn ,nad No labored breathing wheezing.  Somewhat hoarse      Assessment:     Encounter Diagnoses  Name Primary?   Viral syndrome Yes   Acute cough    Uncomplicated asthma, unspecified asthma severity, unspecified whether persistent        Plan:     We discussed limitation of virtual consult.  Not having vitals were able to examine person limits ability to make a good diagnosis and recommendations today  Advised the symptoms suggest viral syndrome.  Advise rest, hydration, continue Robitussin DM, add some Sudafed for a few days for congestion.  Add salt water gargles and warm fluids.  Can use Tylenol as needed.  Advised if much worse by Sunday such as colored mucus, worse chest congestion, wet rattly cough can begin Z-Pak otherwise symptoms suggest virus at this point and Z-Pak would not be needed.  Albuterol as needed for tightness, shortness of breath, coughing spells.  Sandra [name] was seen today for cough.  Diagnoses and all orders for this visit:  Viral syndrome  Acute cough  Uncomplicated asthma, unspecified asthma severity, unspecified whether persistent  Other orders -     albuterol (VENTOLIN HFA) 108 (90 Base) MCG/ACT inhaler; Inhale 2 puffs into the lungs every 6 (six) hours as needed for wheezing or shortness of breath. -     azithromycin (ZITHROMAX) 250 MG tablet; 2 tablets day 1, then 1 tablet days 2-4  F/u prn

## 2023-07-27 ENCOUNTER — Encounter: Payer: 59 | Admitting: Physical Therapy

## 2023-08-04 ENCOUNTER — Ambulatory Visit: Payer: 59 | Admitting: Orthopedic Surgery

## 2023-08-05 ENCOUNTER — Telehealth: Payer: Self-pay | Admitting: Family Medicine

## 2023-08-05 ENCOUNTER — Other Ambulatory Visit: Payer: Self-pay | Admitting: Medical

## 2023-08-05 MED ORDER — HYDROCODONE BIT-HOMATROP MBR 5-1.5 MG/5ML PO SOLN: 5.0000 mL | Freq: Three times a day (TID) | ORAL | 0 refills | Status: DC | PRN

## 2023-08-05 MED ORDER — PROMETHAZINE-DM 6.25-15 MG/5ML PO SYRP: 5.0000 mL | ORAL_SOLUTION | Freq: Four times a day (QID) | ORAL | 0 refills | Status: DC | PRN

## 2023-08-05 NOTE — Telephone Encounter (Signed)
 Pt called and states hydrocodone makes her nauseated, could you call in something else for her cough or something to help with that nausea feeling while taking the hydrocodone.

## 2023-08-05 NOTE — Telephone Encounter (Signed)
 Pt finished zpac Sunday.  States cough went away and came back with a vengeance.  She couldn't even talk for coughing.  She is also wetting herself, she is coughing so much.  Her fever came back last night and feeling bad.  Do you want to start 2nd round antibiotics and she definitely needs something for cough to [facility].  Please let pt know the plan [phone]

## 2023-08-11 DIAGNOSIS — F411 Generalized anxiety disorder: Secondary | ICD-10-CM | POA: Diagnosis not present

## 2023-08-25 ENCOUNTER — Encounter: Payer: Self-pay | Admitting: Orthopedic Surgery

## 2023-08-25 ENCOUNTER — Ambulatory Visit: Payer: 59 | Admitting: Orthopedic Surgery

## 2023-08-25 DIAGNOSIS — M5412 Radiculopathy, cervical region: Secondary | ICD-10-CM | POA: Diagnosis not present

## 2023-08-25 MED ORDER — METHOCARBAMOL 500 MG PO TABS: 500.0000 mg | ORAL_TABLET | Freq: Three times a day (TID) | ORAL | 0 refills | Status: DC | PRN

## 2023-08-25 NOTE — Progress Notes (Signed)
Office Visit Note   Patient: Sandra [name]           Date of Birth: [birth year]           MRN: [id number] Visit Date: 08/25/2023 Requested by: Jac Canavan, PA-C [address] [place],  [place] [address] PCP: Jac Canavan, PA-C  Subjective: Chief Complaint  Patient presents with   Neck - Follow-up    Review MRI    HPI: Sandra [name] is a 48 y.o. female who presents to the office reporting right arm and neck pain.  Since she was last seen she had an MRI of the cervical spine.  This does show disc bulge with endplate osteophytes and marked right and moderate left foraminal stenosis at C5-6.  Patient describes upper trapezial pain.  Overhead motion is actually little bit better.  Does report some pain with abduction.  Cannot really sleep.  Does not take any anticoagulants..                ROS: All systems reviewed are negative as they relate to the chief complaint within the history of present illness.  Patient denies fevers or chills.  Assessment & Plan: Visit Diagnoses:  1. Radiculopathy, cervical region     Plan: Impression is right sided C5-6 disc with foraminal stenosis.  MRI scan is reviewed with the patient.  Plan is to refill Robaxin and refer her to Dr. Alvester Morin for cervical spine ESI.  I think she may do well with that injection but if not she would need to be referred for further surgical consultation.  Follow-Up Instructions: No follow-ups on file.   Orders:  Orders Placed This Encounter  Procedures   Ambulatory referral to Physical Medicine Rehab   Meds ordered this encounter  Medications   methocarbamol (ROBAXIN) 500 MG tablet    Sig: Take 1 tablet (500 mg total) by mouth every 8 (eight) hours as needed for muscle spasms.    Dispense:  30 tablet    Refill:  0      Procedures: No procedures performed   Clinical Data: No additional findings.  Objective: Vital Signs: LMP 04/11/2023   Physical Exam:  Constitutional: Patient appears  well-developed HEENT:  Head: Normocephalic Eyes:EOM are normal Neck: Normal range of motion Cardiovascular: Normal rate Pulmonary/chest: Effort normal Neurologic: Patient is alert Skin: Skin is warm Psychiatric: Patient has normal mood and affect  Ortho Exam: Ortho exam demonstrates pretty reasonable and symmetric shoulder range of motion bilaterally passively.  Rotator cuff strength is intact demonstrates x-rays and subscap muscle testing.  Cervical spine range of motion demonstrates some pain and tenderness with rotation of the head to the right.  No definite paresthesias C5-T1.  Radial pulse intact bilaterally.  No muscle atrophy right arm versus left.  No coarse grinding or crepitus with internal/external rotation of the right arm at 90 degrees of abduction.  Specialty Comments:  No specialty comments available.  Imaging: No results found.   PMFS History: Patient Active Problem List   Diagnosis Date Noted   Abnormal uterine bleeding (AUB) 04/27/2023   Pelvic pain 04/27/2023   Fibromyalgia 11/14/2021   History of sarcoidosis 11/14/2021   Eye pain, bilateral 08/11/2021   Screening for lipid disorders 08/11/2021   Screening for diabetes mellitus 08/11/2021   IUD (intrauterine device) in place 12/27/2020   Flat foot 12/27/2020   Paresthesia of both feet 12/27/2020   Need for Tdap vaccination 07/31/2019   Other nonspecific abnormal finding of lung field 07/31/2019  Pain of left lower extremity 12/13/2017   Sarcoidosis 10/11/2017   Headache syndrome 09/02/2017   Influenza vaccination declined 09/01/2017   Encounter for health maintenance examination in adult 09/01/2017   OAB (overactive bladder) 08/25/2017   Other fatigue 08/25/2017   Adenomyosis 01/31/2015   Dysmenorrhea 01/31/2015   Past Medical History:  Diagnosis Date   Anemia    Arthritis    knees, hips   Asthma    seasonal, last exacerbation 2 years ago as of 04/12/23   Complication of anesthesia    nausea and  vomiting , dose well with patch   COVID-19 virus infection 08/07/2021   Fibromyalgia    Follows w/ rheumatology.   GERD (gastroesophageal reflux disease)    much improved per pt   Headache    occasional   Intraductal papilloma of breast, left 2022   IUD (intrauterine device) in place 2024   OAB (overactive bladder)    PONV (postoperative nausea and vomiting)    "patch works well."   Sarcoidosis 2019   Dr. Coralyn Helling, pulmonologist / As of 04/12/23 patient states that she is not having any lung problems and follows w/ PCP, Crosby Oyster, PA-C.   Uterine fibroid    Wears glasses     Family History  Problem Relation Age of Onset   Asthma Mother    Heart murmur Mother    Diabetes Mother    Sleep apnea Mother    Stomach cancer Maternal Grandmother    Cancer Maternal Grandmother        stomach   Cancer Maternal Grandfather        prostate   Cancer Paternal Grandmother        breast   Kidney disease Paternal Grandmother        dialysis   Sarcoidosis Neg Hx    Heart disease Neg Hx    Stroke Neg Hx    Breast cancer Neg Hx    Colon cancer Neg Hx    Colon polyps Neg Hx    Esophageal cancer Neg Hx    Rectal cancer Neg Hx     Past Surgical History:  Procedure Laterality Date   BREAST EXCISIONAL BIOPSY Right    2015   BREAST EXCISIONAL BIOPSY Left    2007 Papilloma removed   BREAST LUMPECTOMY WITH RADIOACTIVE SEED LOCALIZATION Left 04/08/2021   Procedure: LEFT BREAST LUMPECTOMY WITH RADIOACTIVE SEED LOCALIZATION;  Surgeon: Manus Rudd, MD;  Location: [facility];  Service: General;  Laterality: Left;   BREAST SURGERY     lt breast hematoma   CESAREAN SECTION     x 2   CHOLECYSTECTOMY N/A 06/28/2017   Procedure: LAPAROSCOPIC CHOLECYSTECTOMY;  Surgeon: Abigail Miyamoto, MD;  Location: [facility];  Service: General;  Laterality: N/A;   CYSTOSCOPY Left 04/27/2023   Procedure: CYSTOSCOPY;  Surgeon: Lorriane Shire, MD;  Location: [facility];   Service: Gynecology;  Laterality: Left;   DILATATION & CURETTAGE/HYSTEROSCOPY WITH MYOSURE N/A 03/12/2021   Procedure: DILATATION & CURETTAGE/HYSTEROSCOPY;  Surgeon: [place] Bing, MD;  Location: [facility];  Service: Gynecology;  Laterality: N/A;   ESOPHAGOGASTRODUODENOSCOPY  05/2017   Dr. Loreta Ave   HERNIA REPAIR     Umbilical Hernia   INTRAUTERINE DEVICE (IUD) INSERTION N/A 03/12/2021   Procedure: INTRAUTERINE DEVICE (IUD) INSERTION Mirena;  Surgeon: [place] Bing, MD;  Location: [facility];  Service: Gynecology;  Laterality: N/A;   IUD REMOVAL N/A 03/12/2021   Procedure: INTRAUTERINE DEVICE (IUD) REMOVAL;  Surgeon: [place] Bing, MD;  Location: [facility];  Service: Gynecology;  Laterality: N/A;   ROBOTIC ASSISTED LAPAROSCOPIC HYSTERECTOMY AND SALPINGECTOMY Bilateral 04/27/2023   Procedure: XI ROBOTIC ASSISTED LAPAROSCOPIC TOTAL HYSTERECTOMY AND SALPINGECTOMY;  Surgeon: Lorriane Shire, MD;  Location: [facility];  Service: Gynecology;  Laterality: Bilateral;   VIDEO BRONCHOSCOPY WITH ENDOBRONCHIAL ULTRASOUND N/A 09/28/2017   Procedure: VIDEO BRONCHOSCOPY WITH ENDOBRONCHIAL ULTRASOUND;  Surgeon: Leslye Peer, MD;  Location: [facility];  Service: Thoracic;  Laterality: N/A;   WISDOM TOOTH EXTRACTION     2   Social History   Occupational History   Not on file  Tobacco Use   Smoking status: Never   Smokeless tobacco: Never  Vaping Use   Vaping status: Never Used  Substance and Sexual Activity   Alcohol use: Not Currently    Alcohol/week: 3.0 standard drinks of alcohol    Types: 3 Shots of liquor per week    Comment: stopped in 2023   Drug use: Not Currently    Types: Marijuana    Comment: hx of marijuana use only   Sexual activity: Yes    Birth control/protection: I.U.D.

## 2023-08-31 ENCOUNTER — Telehealth (INDEPENDENT_AMBULATORY_CARE_PROVIDER_SITE_OTHER): Payer: 59 | Admitting: Medical

## 2023-08-31 ENCOUNTER — Encounter: Payer: Self-pay | Admitting: Medical

## 2023-08-31 ENCOUNTER — Other Ambulatory Visit: Payer: Self-pay | Admitting: Obstetrics and Gynecology

## 2023-08-31 VITALS — BP 122/84 | Ht 62.0 in | Wt 198.0 lb

## 2023-08-31 DIAGNOSIS — R197 Diarrhea, unspecified: Secondary | ICD-10-CM | POA: Diagnosis not present

## 2023-08-31 DIAGNOSIS — R112 Nausea with vomiting, unspecified: Secondary | ICD-10-CM

## 2023-08-31 NOTE — Progress Notes (Signed)
 Subjective:     Patient ID: Sandra [name], female   DOB: Apr 16, 1976, 48 y.o.   MRN: [id number]  This visit type was conducted due to national recommendations for restrictions regarding the COVID-19 Pandemic (e.g. social distancing) in an effort to limit this patient's exposure and mitigate transmission in our community.  Due to their co-morbid illnesses, this patient is at least at moderate risk for complications without adequate follow up.  This format is felt to be most appropriate for this patient at this time.    Documentation for virtual audio and video telecommunications through [facility] encounter:  The patient was located at home. The provider was located in the office. The patient did consent to this visit and is aware of possible charges through their insurance for this visit.  The other persons participating in this telemedicine service were none. Time spent on call was 20 minutes and in review of previous records 20 minutes total.  This virtual service is not related to other E/M service within previous 7 days.   HPI Chief Complaint  Patient presents with   Diarrhea    VIRTUAL diarrhea, vomiting, fatigue and body aches that started yesterday. No respiratory symptoms. No home covid tests.    She notes starting yesterday with fatigue, body aches, and an episode of vomiting last night.    Today so far, has had lose stools, has about 10 watery loose stools today, fatigue.  Having body aches today.  No blood in stool.  No urinary changes.  Some nausea today.  No respiratory symptoms, no cough, no sore throat, no congestion.    No sick contacts, no recent travel, was on antibiotics a little over  a month ago, but was fine until yesterday.  No abdominal pain.  No other aggravating or relieving factors. No other complaint.  Past Medical History:  Diagnosis Date   Anemia    Arthritis    knees, hips   Asthma    seasonal, last exacerbation 2 years ago as of 04/12/23    Complication of anesthesia    nausea and vomiting , dose well with patch   COVID-19 virus infection 08/07/2021   Fibromyalgia    Follows w/ rheumatology.   GERD (gastroesophageal reflux disease)    much improved per pt   Headache    occasional   Intraductal papilloma of breast, left 2022   IUD (intrauterine device) in place 2024   OAB (overactive bladder)    PONV (postoperative nausea and vomiting)    patch works well.   Sarcoidosis 2019   Dr. Carolynne Allan, pulmonologist / As of 04/12/23 patient states that she is not having any lung problems and follows w/ PCP, Alm Gent, PA-C.   Uterine fibroid    Wears glasses    Current Outpatient Medications on File Prior to Visit  Medication Sig Dispense Refill   gabapentin  (NEURONTIN ) 300 MG capsule Take 2 capsules (600 mg total) by mouth 3 (three) times daily. 180 capsule 0   methocarbamol  (ROBAXIN ) 500 MG tablet Take 1 tablet (500 mg total) by mouth every 8 (eight) hours as needed for muscle spasms. 30 tablet 0   Multiple Vitamins-Minerals (HAIR SKIN & NAILS PO) Take 3 tablets by mouth daily.     polyethylene glycol (MIRALAX ) 17 g packet Take 17 g by mouth daily. 14 each 2   Vitamin D , Ergocalciferol , (DRISDOL ) 1.25 MG (50000 UNIT) CAPS capsule Take 1 capsule (50,000 Units total) by mouth every 7 (seven) days. 12 capsule 0  albuterol  (VENTOLIN  HFA) 108 (90 Base) MCG/ACT inhaler Inhale 2 puffs into the lungs every 6 (six) hours as needed for wheezing or shortness of breath. (Patient not taking: Reported on 08/31/2023) 8 g 1   ibuprofen  (ADVIL ) 800 MG tablet TAKE 1 TABLET (800 MG TOTAL) BY MOUTH 3 (THREE) TIMES DAILY WITH MEALS AS NEEDED FOR HEADACHE, MODERATE PAIN OR CRAMPING. (Patient not taking: Reported on 08/31/2023) 90 tablet 0   No current facility-administered medications on file prior to visit.    Review of Systems As in subjective    Objective:   Physical Exam Due to coronavirus pandemic stay at home measures, patient visit was  virtual and they were not examined in person.    Gen: wd, wn, nad Mildly ill appearing      Assessment:     Encounter Diagnoses  Name Primary?   Diarrhea of presumed infectious origin Yes   Nausea and vomiting, unspecified vomiting type        Plan:     Your symptoms and exam findings suggest a viral gastroenteritis also known as the stomach bug .  Recommendations: Hydrate well over the next few days to avoid dehydration and to help improve your symptoms Hydration should include water , G2 Gatorade or Pedialyte, soup broth, ice chips or other clear fluids Rest Gradually resume appetite as tolerated, but start with simple easily digested foods and small portions such as rice, applesauce, toast, bananas, soup broth Your symptoms should gradually improve over the next few days Viral gastroenteritis generally takes 4 to 7 days to resolve Practice good hygiene, wash her hands frequently with soap and water  for at least 20 seconds, avoid letting people drink after you, avoid kissing and intimate contact until you are totally back to normal You may take Pepto-Bismol as needed.  Avoid Imodium at this point.  You can use Emetrol the counter for nausea If you have worsening symptoms such as uncontrollable nausea and vomiting, blood in the stool, fever, increasing loose stools such as 5+ loose stools a day, or getting worse diarrhea frequency, then recheck Recheck or return if not improving over the next few days  Jailee was seen today for diarrhea.  Diagnoses and all orders for this visit:  Diarrhea of presumed infectious origin  Nausea and vomiting, unspecified vomiting type  F/u prn

## 2023-09-08 ENCOUNTER — Ambulatory Visit: Payer: 59 | Admitting: Physical Medicine and Rehabilitation

## 2023-09-08 ENCOUNTER — Other Ambulatory Visit: Payer: Self-pay

## 2023-09-08 VITALS — BP 113/74 | HR 81

## 2023-09-08 DIAGNOSIS — M5412 Radiculopathy, cervical region: Secondary | ICD-10-CM

## 2023-09-08 MED ORDER — METHYLPREDNISOLONE ACETATE 40 MG/ML IJ SUSP
40.0000 mg | Freq: Once | INTRAMUSCULAR | Status: AC
  Administered 2023-09-08: 40 mg

## 2023-09-08 NOTE — Patient Instructions (Signed)

## 2023-09-08 NOTE — Progress Notes (Signed)
Pain Scale-9 No Blood thinners No allergies to Contrast Dye

## 2023-09-20 NOTE — Progress Notes (Signed)
 Sandra [name] - 48 y.o. female MRN [id number]  Date of birth: [birth year]  Office Visit Note: Visit Date: 09/08/2023 PCP: Jac Canavan, PA-C Referred by: Jac Canavan, PA-C  Subjective: Chief Complaint  Patient presents with   Neck - Pain   HPI:  Sandra [name] is a 48 y.o. female who comes in today at the request of Dr. Burnard Bunting for planned Right C7-T1 Cervical Interlaminar epidural steroid injection with fluoroscopic guidance.  The patient has failed conservative care including home exercise, medications, time and activity modification.  This injection will be diagnostic and hopefully therapeutic.  Please see requesting physician notes for further details and justification.   ROS Otherwise per HPI.  Assessment & Plan: Visit Diagnoses:    ICD-10-CM   1. Radiculopathy, cervical region  M54.12 XR C-ARM NO REPORT    Epidural Steroid injection    methylPREDNISolone acetate (DEPO-MEDROL) injection 40 mg      Plan: No additional findings.   Meds & Orders:  Meds ordered this encounter  Medications   methylPREDNISolone acetate (DEPO-MEDROL) injection 40 mg    Orders Placed This Encounter  Procedures   XR C-ARM NO REPORT   Epidural Steroid injection    Follow-up: Return for visit to requesting provider as needed.   Procedures: No procedures performed  Cervical Epidural Steroid Injection - Interlaminar Approach with Fluoroscopic Guidance  Patient: Sandra [name]      Date of Birth: 08-Aug-1975 MRN: [id number] PCP: Jac Canavan, PA-C      Visit Date: 09/08/2023   Universal Protocol:    Date/Time: 09/19/2510:08 PM  Consent Given By: the patient  Position: PRONE  Additional Comments: Vital signs were monitored before and after the procedure. Patient was prepped and draped in the usual sterile fashion. The correct patient, procedure, and site was verified.   Injection Procedure Details:   Procedure diagnoses: Radiculopathy, cervical region  [M54.12]    Meds Administered:  Meds ordered this encounter  Medications   methylPREDNISolone acetate (DEPO-MEDROL) injection 40 mg     Laterality: Right  Location/Site: C7-T1  Needle: 3.5 in., 20 ga. Tuohy  Needle Placement: Paramedian epidural space  Findings:  -Comments: Excellent flow of contrast into the epidural space.  Procedure Details: Using a paramedian approach from the side mentioned above, the region overlying the inferior lamina was localized under fluoroscopic visualization and the soft tissues overlying this structure were infiltrated with 4 ml. of 1% Lidocaine without Epinephrine. A # 20 gauge, Tuohy needle was inserted into the epidural space using a paramedian approach.  The epidural space was localized using loss of resistance along with contralateral oblique bi-planar fluoroscopic views.  After negative aspirate for air, blood, and CSF, a 2 ml. volume of Isovue-250 was injected into the epidural space and the flow of contrast was observed. Radiographs were obtained for documentation purposes.   The injectate was administered into the level noted above.  Additional Comments:  The patient tolerated the procedure well Dressing: 2 x 2 sterile gauze and Band-Aid    Post-procedure details: Patient was observed during the procedure. Post-procedure instructions were reviewed.  Patient left the clinic in stable condition.   Clinical History: MRI CERVICAL SPINE WITHOUT CONTRAST   TECHNIQUE: Multiplanar, multisequence MR imaging of the cervical spine was performed. No intravenous contrast was administered.   COMPARISON:  None Available.   FINDINGS: Motion artifact.   Alignment: No significant listhesis.   Vertebrae: Vertebral body heights are maintained. There is mild degenerative endplate irregularity primarily at  C5-C6. No marrow edema. No suspicious lesion.   Cord: No abnormal signal   Posterior Fossa, vertebral arteries, paraspinal  tissues: Unremarkable.   Disc levels:   C2-C3:  No stenosis.   C3-C4:  Disc bulge.  No significant stenosis.   C4-C5:  Disc bulge.  No significant stenosis.   C5-C6: Disc bulge with endplate osteophytes. Uncovertebral hypertrophy. Minor canal stenosis. Marked right and moderate left foraminal stenosis.   C6-C7: Disc bulge with endplate osteophytes. No canal stenosis. No right foraminal stenosis. Mild left foraminal stenosis.   C7-T1:  Facet hypertrophy.  No significant stenosis.   IMPRESSION: Multilevel degenerative changes as detailed above. No significant canal stenosis. Right foraminal narrowing is greatest at C5-C6.     Electronically Signed   By: Guadlupe Spanish M.D.   On: 08/03/2023 14:19     Objective:  VS:  HT:    WT:   BMI:     BP:113/74  HR:81bpm  TEMP: ( )  RESP:  Physical Exam Vitals and nursing note reviewed.  Constitutional:      General: She is not in acute distress.    Appearance: Normal appearance. She is not ill-appearing.  HENT:     Head: Normocephalic and atraumatic.     Right Ear: External ear normal.     Left Ear: External ear normal.  Eyes:     Extraocular Movements: Extraocular movements intact.  Cardiovascular:     Rate and Rhythm: Normal rate.     Pulses: Normal pulses.  Musculoskeletal:     Cervical back: Tenderness present. No rigidity.     Right lower leg: No edema.     Left lower leg: No edema.     Comments: Patient has good strength in the upper extremities including 5 out of 5 strength in wrist extension long finger flexion and APB.  There is no atrophy of the hands intrinsically.  There is a negative Hoffmann's test.   Lymphadenopathy:     Cervical: No cervical adenopathy.  Skin:    Findings: No erythema, lesion or rash.  Neurological:     General: No focal deficit present.     Mental Status: She is alert and oriented to person, place, and time.     Sensory: No sensory deficit.     Motor: No weakness or abnormal muscle  tone.     Coordination: Coordination normal.  Psychiatric:        Mood and Affect: Mood normal.        Behavior: Behavior normal.      Imaging: No results found.

## 2023-09-20 NOTE — Procedures (Signed)
 Cervical Epidural Steroid Injection - Interlaminar Approach with Fluoroscopic Guidance  Patient: Sandra [name]      Date of Birth: 04-07-1976 MRN: [id number] PCP: Jac Canavan, PA-C      Visit Date: 09/08/2023   Universal Protocol:    Date/Time: 09/19/2510:08 PM  Consent Given By: the patient  Position: PRONE  Additional Comments: Vital signs were monitored before and after the procedure. Patient was prepped and draped in the usual sterile fashion. The correct patient, procedure, and site was verified.   Injection Procedure Details:   Procedure diagnoses: Radiculopathy, cervical region [M54.12]    Meds Administered:  Meds ordered this encounter  Medications   methylPREDNISolone acetate (DEPO-MEDROL) injection 40 mg     Laterality: Right  Location/Site: C7-T1  Needle: 3.5 in., 20 ga. Tuohy  Needle Placement: Paramedian epidural space  Findings:  -Comments: Excellent flow of contrast into the epidural space.  Procedure Details: Using a paramedian approach from the side mentioned above, the region overlying the inferior lamina was localized under fluoroscopic visualization and the soft tissues overlying this structure were infiltrated with 4 ml. of 1% Lidocaine without Epinephrine. A # 20 gauge, Tuohy needle was inserted into the epidural space using a paramedian approach.  The epidural space was localized using loss of resistance along with contralateral oblique bi-planar fluoroscopic views.  After negative aspirate for air, blood, and CSF, a 2 ml. volume of Isovue-250 was injected into the epidural space and the flow of contrast was observed. Radiographs were obtained for documentation purposes.   The injectate was administered into the level noted above.  Additional Comments:  The patient tolerated the procedure well Dressing: 2 x 2 sterile gauze and Band-Aid    Post-procedure details: Patient was observed during the procedure. Post-procedure instructions  were reviewed.  Patient left the clinic in stable condition.

## 2023-10-06 ENCOUNTER — Other Ambulatory Visit: Payer: Self-pay | Admitting: Obstetrics and Gynecology

## 2023-10-19 ENCOUNTER — Other Ambulatory Visit: Payer: Self-pay | Admitting: Orthopedic Surgery

## 2023-10-19 ENCOUNTER — Telehealth: Payer: Self-pay | Admitting: Orthopedic Surgery

## 2023-10-19 ENCOUNTER — Other Ambulatory Visit: Payer: Self-pay | Admitting: Surgical

## 2023-10-19 ENCOUNTER — Telehealth: Payer: Self-pay | Admitting: Physical Medicine and Rehabilitation

## 2023-10-19 DIAGNOSIS — N939 Abnormal uterine and vaginal bleeding, unspecified: Secondary | ICD-10-CM

## 2023-10-19 DIAGNOSIS — G8929 Other chronic pain: Secondary | ICD-10-CM

## 2023-10-19 MED ORDER — METHOCARBAMOL 500 MG PO TABS: 500.0000 mg | ORAL_TABLET | Freq: Three times a day (TID) | ORAL | 1 refills | Status: AC | PRN

## 2023-10-19 NOTE — Telephone Encounter (Signed)
 Sent in Robaxin RX

## 2023-10-19 NOTE — Telephone Encounter (Signed)
 Pt is requesting a muscle relaxer for pain her RT shoulder/neck area

## 2023-10-19 NOTE — Telephone Encounter (Signed)
 Pt requesting gel injection.

## 2023-10-20 DIAGNOSIS — Z6836 Body mass index (BMI) 36.0-36.9, adult: Secondary | ICD-10-CM | POA: Diagnosis not present

## 2023-10-20 DIAGNOSIS — M064 Inflammatory polyarthropathy: Secondary | ICD-10-CM | POA: Diagnosis not present

## 2023-10-20 DIAGNOSIS — E669 Obesity, unspecified: Secondary | ICD-10-CM | POA: Diagnosis not present

## 2023-10-20 DIAGNOSIS — F411 Generalized anxiety disorder: Secondary | ICD-10-CM | POA: Diagnosis not present

## 2023-10-20 DIAGNOSIS — Z880 Allergy status to penicillin: Secondary | ICD-10-CM | POA: Diagnosis not present

## 2023-10-20 DIAGNOSIS — J45990 Exercise induced bronchospasm: Secondary | ICD-10-CM | POA: Diagnosis not present

## 2023-10-20 DIAGNOSIS — G629 Polyneuropathy, unspecified: Secondary | ICD-10-CM | POA: Diagnosis not present

## 2023-10-20 DIAGNOSIS — Z809 Family history of malignant neoplasm, unspecified: Secondary | ICD-10-CM | POA: Diagnosis not present

## 2023-10-20 DIAGNOSIS — M461 Sacroiliitis, not elsewhere classified: Secondary | ICD-10-CM | POA: Diagnosis not present

## 2023-10-20 DIAGNOSIS — M62838 Other muscle spasm: Secondary | ICD-10-CM | POA: Diagnosis not present

## 2023-10-20 DIAGNOSIS — R030 Elevated blood-pressure reading, without diagnosis of hypertension: Secondary | ICD-10-CM | POA: Diagnosis not present

## 2023-11-01 ENCOUNTER — Telehealth: Payer: Self-pay | Admitting: Physical Medicine and Rehabilitation

## 2023-11-01 NOTE — Telephone Encounter (Signed)
 Patient called and said she returned your call. FA#[phone]

## 2023-11-01 NOTE — Telephone Encounter (Signed)
 FYI patient does not need gel injection for her shoulder  Patient called stating that her right shoulder feels heavy as if someone is sitting on it and now the pain is back and it is worse after getting injection.  Stated the injection worked for 2 weeks.   Patient would like a CB to discuss

## 2023-11-02 ENCOUNTER — Telehealth: Payer: Self-pay

## 2023-11-02 ENCOUNTER — Other Ambulatory Visit: Payer: Self-pay | Admitting: Physical Medicine and Rehabilitation

## 2023-11-02 DIAGNOSIS — M5412 Radiculopathy, cervical region: Secondary | ICD-10-CM

## 2023-11-02 NOTE — Telephone Encounter (Signed)
 80% of relief/function ability Duration of relief/improvement x 2 weeks Current pain score-10 No Recent falls or injuries Location of pain--Neck radiating to shoulder

## 2023-11-10 ENCOUNTER — Other Ambulatory Visit: Payer: Self-pay

## 2023-11-10 ENCOUNTER — Ambulatory Visit: Admitting: Physical Medicine and Rehabilitation

## 2023-11-10 VITALS — BP 119/77 | HR 96

## 2023-11-10 DIAGNOSIS — M5412 Radiculopathy, cervical region: Secondary | ICD-10-CM | POA: Diagnosis not present

## 2023-11-10 MED ORDER — METHYLPREDNISOLONE ACETATE 40 MG/ML IJ SUSP
40.0000 mg | Freq: Once | INTRAMUSCULAR | Status: AC
Start: 2023-11-10 — End: 2023-11-10
  Administered 2023-11-10: 40 mg

## 2023-11-10 NOTE — Progress Notes (Unsigned)
 Pain Scale   Average Pain 10 Patient advising her pain in the neck area keeps her up at night unable to get any relief. Patient states at time she has pain radiating to her right shoulder"burning" sensation.         +Driver, -BT, -Dye Allergies.

## 2023-11-10 NOTE — Patient Instructions (Signed)

## 2023-11-11 NOTE — Procedures (Signed)
 Cervical Epidural Steroid Injection - Interlaminar Approach with Fluoroscopic Guidance  Patient: Sandra [name]      Date of Birth: Dec 12, 1975 MRN: [id number] PCP: Claudene Crystal, PA-C      Visit Date: 11/10/2023   Universal Protocol:    Date/Time: 11/10/2508:53 AM  Consent Given By: the patient  Position: PRONE  Additional Comments: Vital signs were monitored before and after the procedure. Patient was prepped and draped in the usual sterile fashion. The correct patient, procedure, and site was verified.   Injection Procedure Details:   Procedure diagnoses: Radiculopathy, cervical region [M54.12]    Meds Administered:  Meds ordered this encounter  Medications   methylPREDNISolone acetate (DEPO-MEDROL) injection 40 mg     Laterality: Right  Location/Site: C7-T1  Needle: 3.5 in., 20 ga. Tuohy  Needle Placement: Paramedian epidural space  Findings:  -Comments: Excellent flow of contrast into the epidural space.  Procedure Details: Using a paramedian approach from the side mentioned above, the region overlying the inferior lamina was localized under fluoroscopic visualization and the soft tissues overlying this structure were infiltrated with 4 ml. of 1% Lidocaine without Epinephrine. A # 20 gauge, Tuohy needle was inserted into the epidural space using a paramedian approach.  The epidural space was localized using loss of resistance along with contralateral oblique bi-planar fluoroscopic views.  After negative aspirate for air, blood, and CSF, a 2 ml. volume of Isovue-250 was injected into the epidural space and the flow of contrast was observed. Radiographs were obtained for documentation purposes.   The injectate was administered into the level noted above.  Additional Comments:  The patient tolerated the procedure well Dressing: 2 x 2 sterile gauze and Band-Aid    Post-procedure details: Patient was observed during the procedure. Post-procedure instructions  were reviewed.  Patient left the clinic in stable condition.

## 2023-11-11 NOTE — Progress Notes (Signed)
 Sandra [name] - 48 y.o. female MRN [id number]  Date of birth: 20-Jul-1976  Office Visit Note: Visit Date: 11/10/2023 PCP: Jac Canavan, PA-C Referred by: Jac Canavan, PA-C  Subjective: Chief Complaint  Patient presents with   Neck - Pain   HPI:  Sandra [name] is a 48 y.o. female who comes in today for planned repeat Right C7-T1  Cervical Interlaminar epidural steroid injection with fluoroscopic guidance.  The patient has failed conservative care including home exercise, medications, time and activity modification.  This injection will be diagnostic and hopefully therapeutic.  Please see requesting physician notes for further details and justification. Patient received more than 50% pain relief from prior injection.   Referring: Dr. Marrianne Mood Dean   ROS Otherwise per HPI.  Assessment & Plan: Visit Diagnoses:    ICD-10-CM   1. Radiculopathy, cervical region  M54.12 XR C-ARM NO REPORT    Epidural Steroid injection    methylPREDNISolone acetate (DEPO-MEDROL) injection 40 mg      Plan: No additional findings.   Meds & Orders:  Meds ordered this encounter  Medications   methylPREDNISolone acetate (DEPO-MEDROL) injection 40 mg    Orders Placed This Encounter  Procedures   XR C-ARM NO REPORT   Epidural Steroid injection    Follow-up: Return for visit to requesting provider as needed.   Procedures: No procedures performed  Cervical Epidural Steroid Injection - Interlaminar Approach with Fluoroscopic Guidance  Patient: Sandra [name]      Date of Birth: [birth year] MRN: [id number] PCP: Jac Canavan, PA-C      Visit Date: 11/10/2023   Universal Protocol:    Date/Time: 11/10/2508:53 AM  Consent Given By: the patient  Position: PRONE  Additional Comments: Vital signs were monitored before and after the procedure. Patient was prepped and draped in the usual sterile fashion. The correct patient, procedure, and site was verified.   Injection Procedure  Details:   Procedure diagnoses: Radiculopathy, cervical region [M54.12]    Meds Administered:  Meds ordered this encounter  Medications   methylPREDNISolone acetate (DEPO-MEDROL) injection 40 mg     Laterality: Right  Location/Site: C7-T1  Needle: 3.5 in., 20 ga. Tuohy  Needle Placement: Paramedian epidural space  Findings:  -Comments: Excellent flow of contrast into the epidural space.  Procedure Details: Using a paramedian approach from the side mentioned above, the region overlying the inferior lamina was localized under fluoroscopic visualization and the soft tissues overlying this structure were infiltrated with 4 ml. of 1% Lidocaine without Epinephrine. A # 20 gauge, Tuohy needle was inserted into the epidural space using a paramedian approach.  The epidural space was localized using loss of resistance along with contralateral oblique bi-planar fluoroscopic views.  After negative aspirate for air, blood, and CSF, a 2 ml. volume of Isovue-250 was injected into the epidural space and the flow of contrast was observed. Radiographs were obtained for documentation purposes.   The injectate was administered into the level noted above.  Additional Comments:  The patient tolerated the procedure well Dressing: 2 x 2 sterile gauze and Band-Aid    Post-procedure details: Patient was observed during the procedure. Post-procedure instructions were reviewed.  Patient left the clinic in stable condition.   Clinical History: MRI CERVICAL SPINE WITHOUT CONTRAST   TECHNIQUE: Multiplanar, multisequence MR imaging of the cervical spine was performed. No intravenous contrast was administered.   COMPARISON:  None Available.   FINDINGS: Motion artifact.   Alignment: No significant listhesis.   Vertebrae: Vertebral body  heights are maintained. There is mild degenerative endplate irregularity primarily at C5-C6. No marrow edema. No suspicious lesion.   Cord: No abnormal signal    Posterior Fossa, vertebral arteries, paraspinal tissues: Unremarkable.   Disc levels:   C2-C3:  No stenosis.   C3-C4:  Disc bulge.  No significant stenosis.   C4-C5:  Disc bulge.  No significant stenosis.   C5-C6: Disc bulge with endplate osteophytes. Uncovertebral hypertrophy. Minor canal stenosis. Marked right and moderate left foraminal stenosis.   C6-C7: Disc bulge with endplate osteophytes. No canal stenosis. No right foraminal stenosis. Mild left foraminal stenosis.   C7-T1:  Facet hypertrophy.  No significant stenosis.   IMPRESSION: Multilevel degenerative changes as detailed above. No significant canal stenosis. Right foraminal narrowing is greatest at C5-C6.     Electronically Signed   By: Geannie Keener M.D.   On: 08/03/2023 14:19     Objective:  VS:  HT:    WT:   BMI:     BP:119/77  HR:96bpm  TEMP: ( )  RESP:  Physical Exam Vitals and nursing note reviewed.  Constitutional:      General: She is not in acute distress.    Appearance: Normal appearance. She is not ill-appearing.  HENT:     Head: Normocephalic and atraumatic.     Right Ear: External ear normal.     Left Ear: External ear normal.  Eyes:     Extraocular Movements: Extraocular movements intact.  Cardiovascular:     Rate and Rhythm: Normal rate.     Pulses: Normal pulses.  Musculoskeletal:     Cervical back: Tenderness present. No rigidity.     Right lower leg: No edema.     Left lower leg: No edema.     Comments: Patient has good strength in the upper extremities including 5 out of 5 strength in wrist extension long finger flexion and APB.  There is no atrophy of the hands intrinsically.  There is a negative Hoffmann's test.   Lymphadenopathy:     Cervical: No cervical adenopathy.  Skin:    Findings: No erythema, lesion or rash.  Neurological:     General: No focal deficit present.     Mental Status: She is alert and oriented to person, place, and time.     Sensory: No sensory  deficit.     Motor: No weakness or abnormal muscle tone.     Coordination: Coordination normal.  Psychiatric:        Mood and Affect: Mood normal.        Behavior: Behavior normal.      Imaging: XR C-ARM NO REPORT Result Date: 11/10/2023 Please see Notes tab for imaging impression.

## 2023-12-01 ENCOUNTER — Encounter: Payer: Self-pay | Admitting: Orthopedic Surgery

## 2023-12-01 ENCOUNTER — Other Ambulatory Visit: Payer: Self-pay

## 2023-12-01 ENCOUNTER — Ambulatory Visit: Admitting: Orthopedic Surgery

## 2023-12-01 DIAGNOSIS — M25511 Pain in right shoulder: Secondary | ICD-10-CM | POA: Diagnosis not present

## 2023-12-01 DIAGNOSIS — M5412 Radiculopathy, cervical region: Secondary | ICD-10-CM

## 2023-12-01 MED ORDER — CYCLOBENZAPRINE HCL 10 MG PO TABS: ORAL_TABLET | ORAL | 0 refills | Status: AC

## 2023-12-01 NOTE — Progress Notes (Signed)
 Office Visit Note   Patient: Sandra [name]           Date of Birth: Apr 04, 1976           MRN: [id number] Visit Date: 12/01/2023 Requested by: Claudene Crystal, PA-C [address] [place],  [place] [address] PCP: Claudene Crystal, PA-C  Subjective: Chief Complaint  Patient presents with   Other    CONTINUED ARM/SHOULDER PAIN    HPI: Sandra [name] is a 48 y.o. female who presents to the office reporting continued shoulder pain.  Patient had epidural steroid injection 11/10/2023 as well as 09/08/2023.  That helped with the pressure and the numbness and tingling she really reports the majority if not all of her pain in the upper trapezial region.  Reports muscle spasms in the neck.  She would like to have physical therapy referral and TENS unit.  Her Robaxin  muscle relaxer does not help much.  Does have some radiating pain to the elbow.  At times she cannot move the arm because of the pain but that is primarily pain that is referring to that upper trapezial region in the neck.  Raising the arm hurts in the neck but not the shoulder.  She does have right foraminal stenosis at C5-6 by MRI of the cervical spine.  Hard for her to sleep.  She also reports scapular pain.  Motrin  helps but she states she is taking too much..                ROS: All systems reviewed are negative as they relate to the chief complaint within the history of present illness.  Patient denies fevers or chills.  Assessment & Plan: Visit Diagnoses:  1. Right shoulder pain, unspecified chronicity   2. Radiculopathy, cervical region     Plan: Impression is complicated problem which does not really look to be involving the shoulder is much as it does the neck and the upper trapezial region.  Would like to try physical therapy here 1-2 times a week for 2 to 6 weeks for neck spasm treatment.  Also TENS unit prescribed.  We will also try Flexeril 10 mg p.o. every 12 hours as needed pain #30.  Shoulder exam today is pretty  underwhelming for any type of intrinsic shoulder pathology.  Follow-Up Instructions: No follow-ups on file.   Orders:  Orders Placed This Encounter  Procedures   Ambulatory referral to Physical Therapy   Meds ordered this encounter  Medications   cyclobenzaprine (FLEXERIL) 10 MG tablet    Sig: 1 po q 12hrs prn    Dispense:  30 tablet    Refill:  0      Procedures: No procedures performed   Clinical Data: No additional findings.  Objective: Vital Signs: LMP 04/11/2023   Physical Exam:  Constitutional: Patient appears well-developed HEENT:  Head: Normocephalic Eyes:EOM are normal Neck: Normal range of motion Cardiovascular: Normal rate Pulmonary/chest: Effort normal Neurologic: Patient is alert Skin: Skin is warm Psychiatric: Patient has normal mood and affect  Ortho Exam: Ortho exam demonstrates pretty good cervical spine range of motion.  5 out of 5 grip EPL FPL interosseous are/extension bicep triceps and deltoid strength.  No definite paresthesias C5-T1.  Shoulder range of motion is full but when she actively tries to AB duct the arm she does report pain in the upper trapezial region.  No scapular dyskinesia with forward flexion.  No restriction of passive external rotation at 90 degrees of abduction.  No other masses  lymphadenopathy or skin changes noted in the shoulder region  Specialty Comments:  MRI CERVICAL SPINE WITHOUT CONTRAST   TECHNIQUE: Multiplanar, multisequence MR imaging of the cervical spine was performed. No intravenous contrast was administered.   COMPARISON:  None Available.   FINDINGS: Motion artifact.   Alignment: No significant listhesis.   Vertebrae: Vertebral body heights are maintained. There is mild degenerative endplate irregularity primarily at C5-C6. No marrow edema. No suspicious lesion.   Cord: No abnormal signal   Posterior Fossa, vertebral arteries, paraspinal tissues: Unremarkable.   Disc levels:   C2-C3:  No  stenosis.   C3-C4:  Disc bulge.  No significant stenosis.   C4-C5:  Disc bulge.  No significant stenosis.   C5-C6: Disc bulge with endplate osteophytes. Uncovertebral hypertrophy. Minor canal stenosis. Marked right and moderate left foraminal stenosis.   C6-C7: Disc bulge with endplate osteophytes. No canal stenosis. No right foraminal stenosis. Mild left foraminal stenosis.   C7-T1:  Facet hypertrophy.  No significant stenosis.   IMPRESSION: Multilevel degenerative changes as detailed above. No significant canal stenosis. Right foraminal narrowing is greatest at C5-C6.     Electronically Signed   By: Geannie Keener M.D.   On: 08/03/2023 14:19  Imaging: No results found.   PMFS History: Patient Active Problem List   Diagnosis Date Noted   Abnormal uterine bleeding (AUB) 04/27/2023   Pelvic pain 04/27/2023   Fibromyalgia 11/14/2021   History of sarcoidosis 11/14/2021   Eye pain, bilateral 08/11/2021   Screening for lipid disorders 08/11/2021   Screening for diabetes mellitus 08/11/2021   IUD (intrauterine device) in place 12/27/2020   Flat foot 12/27/2020   Paresthesia of both feet 12/27/2020   Need for Tdap vaccination 07/31/2019   Other nonspecific abnormal finding of lung field 07/31/2019   Pain of left lower extremity 12/13/2017   Sarcoidosis 10/11/2017   Headache syndrome 09/02/2017   Influenza vaccination declined 09/01/2017   Encounter for health maintenance examination in adult 09/01/2017   OAB (overactive bladder) 08/25/2017   Other fatigue 08/25/2017   Adenomyosis 01/31/2015   Dysmenorrhea 01/31/2015   Past Medical History:  Diagnosis Date   Anemia    Arthritis    knees, hips   Asthma    seasonal, last exacerbation 2 years ago as of 04/12/23   Complication of anesthesia    nausea and vomiting , dose well with patch   COVID-19 virus infection 08/07/2021   Fibromyalgia    Follows w/ rheumatology.   GERD (gastroesophageal reflux disease)    much  improved per pt   Headache    occasional   Intraductal papilloma of breast, left 2022   IUD (intrauterine device) in place 2024   OAB (overactive bladder)    PONV (postoperative nausea and vomiting)    "patch works well."   Sarcoidosis 2019   Dr. Wilder Handy, pulmonologist / As of 04/12/23 patient states that she is not having any lung problems and follows w/ PCP, Nelda Balsam, PA-C.   Uterine fibroid    Wears glasses     Family History  Problem Relation Age of Onset   Asthma Mother    Heart murmur Mother    Diabetes Mother    Sleep apnea Mother    Stomach cancer Maternal Grandmother    Cancer Maternal Grandmother        stomach   Cancer Maternal Grandfather        prostate   Cancer Paternal Grandmother        breast  Kidney disease Paternal Grandmother        dialysis   Sarcoidosis Neg Hx    Heart disease Neg Hx    Stroke Neg Hx    Breast cancer Neg Hx    Colon cancer Neg Hx    Colon polyps Neg Hx    Esophageal cancer Neg Hx    Rectal cancer Neg Hx     Past Surgical History:  Procedure Laterality Date   BREAST EXCISIONAL BIOPSY Right    2015   BREAST EXCISIONAL BIOPSY Left    2007 Papilloma removed   BREAST LUMPECTOMY WITH RADIOACTIVE SEED LOCALIZATION Left 04/08/2021   Procedure: LEFT BREAST LUMPECTOMY WITH RADIOACTIVE SEED LOCALIZATION;  Surgeon: Dareen Ebbing, MD;  Location: [facility];  Service: General;  Laterality: Left;   BREAST SURGERY     lt breast hematoma   CESAREAN SECTION     x 2   CHOLECYSTECTOMY N/A 06/28/2017   Procedure: LAPAROSCOPIC CHOLECYSTECTOMY;  Surgeon: Oza Blumenthal, MD;  Location: [facility];  Service: General;  Laterality: N/A;   CYSTOSCOPY Left 04/27/2023   Procedure: CYSTOSCOPY;  Surgeon: Kiki Pelton, MD;  Location: [facility];  Service: Gynecology;  Laterality: Left;   DILATATION & CURETTAGE/HYSTEROSCOPY WITH MYOSURE N/A 03/12/2021   Procedure: DILATATION & CURETTAGE/HYSTEROSCOPY;  Surgeon:  Raynell Caller, MD;  Location: [facility];  Service: Gynecology;  Laterality: N/A;   ESOPHAGOGASTRODUODENOSCOPY  05/2017   Dr. Tova Fresh   HERNIA REPAIR     Umbilical Hernia   INTRAUTERINE DEVICE (IUD) INSERTION N/A 03/12/2021   Procedure: INTRAUTERINE DEVICE (IUD) INSERTION Mirena ;  Surgeon: Raynell Caller, MD;  Location: [facility];  Service: Gynecology;  Laterality: N/A;   IUD REMOVAL N/A 03/12/2021   Procedure: INTRAUTERINE DEVICE (IUD) REMOVAL;  Surgeon: Raynell Caller, MD;  Location: [facility] ;  Service: Gynecology;  Laterality: N/A;   ROBOTIC ASSISTED LAPAROSCOPIC HYSTERECTOMY AND SALPINGECTOMY Bilateral 04/27/2023   Procedure: XI ROBOTIC ASSISTED LAPAROSCOPIC TOTAL HYSTERECTOMY AND SALPINGECTOMY;  Surgeon: Kiki Pelton, MD;  Location: [facility];  Service: Gynecology;  Laterality: Bilateral;   VIDEO BRONCHOSCOPY WITH ENDOBRONCHIAL ULTRASOUND N/A 09/28/2017   Procedure: VIDEO BRONCHOSCOPY WITH ENDOBRONCHIAL ULTRASOUND;  Surgeon: Denson Flake, MD;  Location: [facility];  Service: Thoracic;  Laterality: N/A;   WISDOM TOOTH EXTRACTION     2   Social History   Occupational History   Not on file  Tobacco Use   Smoking status: Never   Smokeless tobacco: Never  Vaping Use   Vaping status: Never Used  Substance and Sexual Activity   Alcohol use: Not Currently    Alcohol/week: 3.0 standard drinks of alcohol    Types: 3 Shots of liquor per week    Comment: stopped in 2023   Drug use: Not Currently    Types: Marijuana    Comment: hx of marijuana use only   Sexual activity: Yes    Birth control/protection: I.U.D.

## 2023-12-14 ENCOUNTER — Ambulatory Visit: Admitting: Physical Therapy

## 2023-12-14 ENCOUNTER — Other Ambulatory Visit: Payer: Self-pay

## 2023-12-14 DIAGNOSIS — M6281 Muscle weakness (generalized): Secondary | ICD-10-CM

## 2023-12-14 DIAGNOSIS — R293 Abnormal posture: Secondary | ICD-10-CM | POA: Diagnosis not present

## 2023-12-14 DIAGNOSIS — R279 Unspecified lack of coordination: Secondary | ICD-10-CM | POA: Diagnosis not present

## 2023-12-14 DIAGNOSIS — M542 Cervicalgia: Secondary | ICD-10-CM

## 2023-12-14 NOTE — Therapy (Addendum)
 OUTPATIENT PHYSICAL THERAPY CERVICAL EVALUATION / DISCHARGE   Patient Name: Sandra [name] MRN: [id number] DOB:August 16, 1975, 48 y.o., female Today's Date: 12/14/2023  END OF SESSION:  PT End of Session - 12/14/23 1437     Visit Number 1    Number of Visits 6    Date for PT Re-Evaluation 01/25/24    Authorization Type [employer]; 30 visit limit    Authorization - Visit Number 1    Authorization - Number of Visits 30    PT Start Time 1435    PT Stop Time 1515    PT Time Calculation (min) 40 min             Past Medical History:  Diagnosis Date   Anemia    Arthritis    knees, hips   Asthma    seasonal, last exacerbation 2 years ago as of 04/12/23   Complication of anesthesia    nausea and vomiting , dose well with patch   COVID-19 virus infection 08/07/2021   Fibromyalgia    Follows w/ rheumatology.   GERD (gastroesophageal reflux disease)    much improved per pt   Headache    occasional   Intraductal papilloma of breast, left 2022   IUD (intrauterine device) in place 2024   OAB (overactive bladder)    PONV (postoperative nausea and vomiting)    patch works well.   Sarcoidosis 2019   Dr. Carolynne Allan, pulmonologist / As of 04/12/23 patient states that she is not having any lung problems and follows w/ PCP, Alm Gent, PA-C.   Uterine fibroid    Wears glasses    Past Surgical History:  Procedure Laterality Date   BREAST EXCISIONAL BIOPSY Right    2015   BREAST EXCISIONAL BIOPSY Left    2007 Papilloma removed   BREAST LUMPECTOMY WITH RADIOACTIVE SEED LOCALIZATION Left 04/08/2021   Procedure: LEFT BREAST LUMPECTOMY WITH RADIOACTIVE SEED LOCALIZATION;  Surgeon: Belinda Cough, MD;  Location: [facility];  Service: General;  Laterality: Left;   BREAST SURGERY     lt breast hematoma   CESAREAN SECTION     x 2   CHOLECYSTECTOMY N/A 06/28/2017   Procedure: LAPAROSCOPIC CHOLECYSTECTOMY;  Surgeon: Vernetta Berg, MD;  Location: [facility];  Service: General;   Laterality: N/A;   CYSTOSCOPY Left 04/27/2023   Procedure: CYSTOSCOPY;  Surgeon: Jeralyn Crutch, MD;  Location: [facility];  Service: Gynecology;  Laterality: Left;   DILATATION & CURETTAGE/HYSTEROSCOPY WITH MYOSURE N/A 03/12/2021   Procedure: DILATATION & CURETTAGE/HYSTEROSCOPY;  Surgeon: Izell Harari, MD;  Location: [facility];  Service: Gynecology;  Laterality: N/A;   ESOPHAGOGASTRODUODENOSCOPY  05/2017   Dr. Kristie   HERNIA REPAIR     Umbilical Hernia   INTRAUTERINE DEVICE (IUD) INSERTION N/A 03/12/2021   Procedure: INTRAUTERINE DEVICE (IUD) INSERTION Mirena ;  Surgeon: Izell Harari, MD;  Location: [facility];  Service: Gynecology;  Laterality: N/A;   IUD REMOVAL N/A 03/12/2021   Procedure: INTRAUTERINE DEVICE (IUD) REMOVAL;  Surgeon: Izell Harari, MD;  Location: [facility] [place];  Service: Gynecology;  Laterality: N/A;   ROBOTIC ASSISTED LAPAROSCOPIC HYSTERECTOMY AND SALPINGECTOMY Bilateral 04/27/2023   Procedure: XI ROBOTIC ASSISTED LAPAROSCOPIC TOTAL HYSTERECTOMY AND SALPINGECTOMY;  Surgeon: Jeralyn Crutch, MD;  Location: [facility];  Service: Gynecology;  Laterality: Bilateral;   VIDEO BRONCHOSCOPY WITH ENDOBRONCHIAL ULTRASOUND N/A 09/28/2017   Procedure: VIDEO BRONCHOSCOPY WITH ENDOBRONCHIAL ULTRASOUND;  Surgeon: Shelah Lamar RAMAN, MD;  Location: [facility];  Service: Thoracic;  Laterality: N/A;   WISDOM TOOTH EXTRACTION     2   Patient Active Problem List   Diagnosis Date Noted   Abnormal uterine bleeding (AUB) 04/27/2023   Pelvic pain 04/27/2023   Fibromyalgia 11/14/2021   History of sarcoidosis 11/14/2021   Eye pain, bilateral 08/11/2021   Screening for lipid disorders 08/11/2021   Screening for diabetes mellitus 08/11/2021   IUD (intrauterine device) in place 12/27/2020   Flat foot 12/27/2020   Paresthesia of both feet 12/27/2020   Need for Tdap vaccination 07/31/2019   Other nonspecific  abnormal finding of lung field 07/31/2019   Pain of left lower extremity 12/13/2017   Sarcoidosis 10/11/2017   Headache syndrome 09/02/2017   Influenza vaccination declined 09/01/2017   Encounter for health maintenance examination in adult 09/01/2017   OAB (overactive bladder) 08/25/2017   Other fatigue 08/25/2017   Adenomyosis 01/31/2015   Dysmenorrhea 01/31/2015    PCP: Bulah Alm RAMAN, PA-C  REFERRING PROVIDER: Addie Cordella Hamilton, MD  REFERRING DIAG: M25.511 (ICD-10-CM) - Right shoulder pain, unspecified chronicity M54.12 (ICD-10-CM) - Radiculopathy, cervical region  THERAPY DIAG:  Cervicalgia  Muscle weakness (generalized)  Abnormal posture  Unspecified lack of coordination  Rationale for Evaluation and Treatment: Rehabilitation  ONSET DATE: Since Feb 2025   SUBJECTIVE:                                                                                                                                                                                                         SUBJECTIVE STATEMENT: I told Dr. Addie that the shots helped relieve tingling sensation and pressure in the arm; however, the muscle spasms on the top of my shoulder have worsened. Pt states she can't wash or move her arm a certain way. Pt states she was given a stronger muscle relaxer which does calm it down but states that the muscle spasm has been the worst. Pt reports she was using TENS but her pads no longer work and her unit is old. States her R shoulder is very sore and painful. Muscle jumps. Unable to sleep at times due to pain/discomfort.  Hand dominance: Right  PERTINENT HISTORY:  Patient had epidural steroid injection 11/10/2023 as well as 09/08/2023 Per referral note: PT 1-2x/wk for 2-6 weeks Cervical spine Neck spasms Wants to try and get TENS unit approved by insurance  PAIN:  Are you having pain? Yes: NPRS scale: 9 currently Pain location: R top shoulder and neck Pain description: Muscle  spasm Aggravating factors: Any arm movement and certain sleep positions Relieving factors: Self massage, TENS  PRECAUTIONS: None  RED FLAGS: None     WEIGHT BEARING RESTRICTIONS: No  FALLS:  Has patient fallen in last 6 months? 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  DIAGNOSTIC FINDINGS:  IMPRESSION: Multilevel degenerative changes as detailed above. No significant canal stenosis. Right foraminal narrowing is greatest at C5-C6.  PATIENT SURVEYS:  The Patient-Specific Functional Scale  Initial:  I am going to ask you to identify up to 3 important activities that you are unable to do or are having difficulty with as a result of this problem.  Today are there any activities that you are unable to do or having difficulty with because of this?  (Patient shown scale and patient rated each activity)  Follow up: When you first came in you had difficulty performing these activities.  Today do you still have difficulty?  Patient-Specific activity scoring scheme (Point to one number):  0 1 2 3 4 5 6 7 8 9  10 Unable                                                                                                          Able to perform To perform                                                                                                    activity at the same Activity         Level as before                                                                                                                       Injury or problem Activity Initial (eval): Follow up:  Wiping table off 3   2.   Wiping herself  3   3.   Cleaning a tub out 3   4.  Lift  arms above shoulder 2   AVERAGE 11/4 = 2.75      COGNITION: Overall cognitive status: Within functional limits for tasks assessed  SENSATION: WFL  POSTURE: rounded shoulders and forward head  PALPATION: R UT and levator scap tenderness    CERVICAL ROM:   Active ROM A/PROM (deg) eval  Flexion 40 stretch into R of neck and jaw  Extension 35 pulls into R shoulder  Right lateral flexion 25 pain  Left lateral flexion 40  Right rotation 45 pain  Left rotation 50   (Blank rows = not tested)  UPPER EXTREMITY ROM:  Active ROM Right eval Left eval  Shoulder flexion 80 pain 170  Shoulder extension    Shoulder abduction 75 pain 160  Shoulder adduction    Shoulder extension    Shoulder internal rotation To top of R glute and painful T3  Shoulder external rotation 37 pain 60  Elbow flexion    Elbow extension    Wrist flexion    Wrist extension    Wrist ulnar deviation    Wrist radial deviation    Wrist pronation    Wrist supination     (Blank rows = not tested)  UPPER EXTREMITY MMT: Deferred due to pain. Current strength appears to be inhibited due to her pain  MMT Right eval Left eval  Shoulder flexion    Shoulder extension    Shoulder abduction    Shoulder adduction    Shoulder extension    Shoulder internal rotation    Shoulder external rotation    Middle trapezius    Lower trapezius    Elbow flexion    Elbow extension    Wrist flexion    Wrist extension    Wrist ulnar deviation    Wrist radial deviation    Wrist pronation    Wrist supination    Grip strength     (Blank rows = not tested)  CERVICAL SPECIAL TESTS:  Did not assess  FUNCTIONAL TESTS:  Did not assess  TREATMENT DATE: 12/14/23 Skilled assessment and palpation for TPDN STM & TPR R UT Trigger Point Dry Needling  Initial Treatment: Pt instructed on Dry Needling rational, procedures, and possible side effects. Pt instructed to expect mild to moderate muscle soreness later in the day  and/or into the next day.  Pt instructed to continue prescribed HEP. Patient was educated on signs and symptoms of infection and other risk factors and advised to seek medical attention should they occur.  Patient verbalized understanding of these instructions and education.   Patient Verbal Consent Given: Yes Education Handout Provided: No will provide next session Muscles Treated: R UT Electrical Stimulation Performed: No Treatment Response/Outcome: Twitch response, decreased muscle tension  Premod estim on UT x10 min, intensity to pt tolerance while performing exercises (see HEP)    PATIENT EDUCATION:  Education details: Exam findings, POC, initial HEP, TPDN, TENS Person educated: Patient Education method: Explanation, Demonstration, and Handouts Education comprehension: verbalized understanding, returned demonstration, and needs further education  HOME EXERCISE PROGRAM: Access Code: [email or web address] URL: [email or web address] Date: 12/14/2023 Prepared by: Evia Goldsmith April Marie Zachary Lovins  Exercises - Seated Gentle Upper Trapezius Stretch  - 1 x daily - 7 x weekly - 2 sets - 30 sec hold - Gentle Levator Scapulae Stretch (Mirrored)  - 1 x daily - 7 x weekly - 2 sets - 30 sec hold - Seated Cervical Retraction  - 3-5 x daily - 7 x weekly - 1 sets - 10 reps -  3 sec hold - Shoulder External Rotation and Scapular Retraction  - 3-5 x daily - 7 x weekly - 1 sets - 10 reps  Patient Education - TENS Unit - Trigger Point Dry Needling  ASSESSMENT:  CLINICAL IMPRESSION: Patient is a 48 y.o. F who was seen today for physical therapy evaluation and treatment for neck/shoulder pain from muscle spasm. PMH significant for steroid injections which helped with her initial pain and abnormal sensations; however, reports increased neck/shoulder muscle spasm affecting her ability to move her R UE. Assessment is significant for reduced and painful cervical ROM as well as reduced and painful shoulder  ROM. Pain appears to currently inhibit shoulder strength testing. Pt with large and tender UT trigger point and spasm addressed with needling, premod e-stim and initiation of HEP this session. Pt will greatly benefit from PT to improve the function of her R UE for home and work tasks.   OBJECTIVE IMPAIRMENTS: decreased activity tolerance, decreased coordination, decreased endurance, decreased mobility, decreased ROM, decreased strength, increased fascial restrictions, increased muscle spasms, impaired UE functional use, improper body mechanics, postural dysfunction, and pain.   ACTIVITY LIMITATIONS: carrying, lifting, sleeping, bathing, toileting, dressing, reach over head, hygiene/grooming, and caring for others  PARTICIPATION LIMITATIONS: meal prep, cleaning, laundry, driving, shopping, community activity, and occupation  PERSONAL FACTORS: Age, Fitness, Past/current experiences, Profession, and Time since onset of injury/illness/exacerbation are also affecting patient's functional outcome.   REHAB POTENTIAL: Good  CLINICAL DECISION MAKING: Evolving/moderate complexity  EVALUATION COMPLEXITY: Moderate   GOALS: Goals reviewed with patient? Yes  SHORT TERM GOALS: Target date: 01/04/2024   Pt will be ind with initial HEP Baseline:  Goal status: INITIAL  2.  Pt will demo >/=50% improvement in pain with cervical ROM Baseline:  Goal status: INITIAL   LONG TERM GOALS: Target date: 01/25/2024   Pt will be ind with management and progression of HEP Baseline:  Goal status: INITIAL  2.  Pt will demo pain free and full cervical ROM Baseline:  Goal status: INITIAL  3.  Pt will be able to elevate UEs >/=140 deg for overhead movements Baseline:  Goal status: INITIAL  4.  Pt will have improved PSFS average score to >/=4.75 to demo MCID Baseline:  Goal status: INITIAL  5.  Pt will be ind with muscle spasm management Baseline:  Goal status: INITIAL     PLAN:  PT FREQUENCY:  2x/week  PT DURATION: 6 weeks  PLANNED INTERVENTIONS: [id number]- PT Re-evaluation, 97750- Physical Performance Testing, 97110-Therapeutic exercises, 97530- Therapeutic activity, [id number]- Neuromuscular re-education, 97535- Self Care, [id number]- Manual therapy, [id number]- Electrical stimulation (unattended), [phone]- Ionotophoresis 4mg /ml Dexamethasone , Patient/Family education, Taping, Dry Needling, Joint mobilization, Spinal mobilization, Cryotherapy, and Moist heat  PLAN FOR NEXT SESSION: Assess response to HEP. Manual if pt able to tolerate for UT muscle spasm. Dry needling if she had good response. Cervical and postural strengthening to decrease UT spasm.   Fransisca Shawn April Ma L Jendayi Berling, PT, DPT 12/14/2023, 4:11 PM   PHYSICAL THERAPY DISCHARGE SUMMARY  Visits from Start of Care: 1  Current functional level related to goals / functional outcomes: See note   Remaining deficits: See note   Education / Equipment: HEP  Patient goals were not met. Patient is being discharged due to not returning since the last visit.  Ozell Silvan, PT, DPT, OCS, ATC 04/10/24  2:55 PM

## 2023-12-24 ENCOUNTER — Telehealth: Payer: Self-pay | Admitting: Physical Therapy

## 2023-12-24 ENCOUNTER — Encounter: Admitting: Physical Therapy

## 2023-12-24 ENCOUNTER — Ambulatory Visit: Admitting: Physical Therapy

## 2023-12-24 NOTE — Telephone Encounter (Signed)
 No-show for today's PT session- attempted to call her, but VM was full and I could not leave a message.  Terrel Ferries, PT, DPT 12/24/23 2:54 PM

## 2023-12-30 ENCOUNTER — Other Ambulatory Visit: Payer: Self-pay | Admitting: Orthopedic Surgery

## 2023-12-30 ENCOUNTER — Other Ambulatory Visit: Payer: Self-pay | Admitting: Obstetrics and Gynecology

## 2023-12-30 DIAGNOSIS — G8929 Other chronic pain: Secondary | ICD-10-CM

## 2023-12-30 DIAGNOSIS — N939 Abnormal uterine and vaginal bleeding, unspecified: Secondary | ICD-10-CM

## 2024-01-29 ENCOUNTER — Other Ambulatory Visit: Payer: Self-pay | Admitting: Orthopedic Surgery

## 2024-01-29 DIAGNOSIS — N939 Abnormal uterine and vaginal bleeding, unspecified: Secondary | ICD-10-CM

## 2024-01-29 DIAGNOSIS — G8929 Other chronic pain: Secondary | ICD-10-CM

## 2024-03-02 ENCOUNTER — Other Ambulatory Visit: Payer: Self-pay | Admitting: Orthopedic Surgery

## 2024-03-02 DIAGNOSIS — N939 Abnormal uterine and vaginal bleeding, unspecified: Secondary | ICD-10-CM

## 2024-03-02 DIAGNOSIS — G8929 Other chronic pain: Secondary | ICD-10-CM

## 2024-05-29 ENCOUNTER — Encounter: Payer: Self-pay | Admitting: Radiology
